# Patient Record
Sex: Male | Born: 1935 | Race: White | Hispanic: No | Marital: Single | Smoking: Former smoker
Health system: Southern US, Community
[De-identification: ages and names within clinical notes are randomized; demographics above are authoritative.]

## PROBLEM LIST (undated history)

## (undated) DIAGNOSIS — I480 Paroxysmal atrial fibrillation: Secondary | ICD-10-CM

## (undated) DIAGNOSIS — I428 Other cardiomyopathies: Secondary | ICD-10-CM

## (undated) DIAGNOSIS — N183 Chronic kidney disease, stage 3 unspecified: Secondary | ICD-10-CM

## (undated) DIAGNOSIS — R001 Bradycardia, unspecified: Secondary | ICD-10-CM

## (undated) DIAGNOSIS — H9190 Unspecified hearing loss, unspecified ear: Secondary | ICD-10-CM

## (undated) DIAGNOSIS — I340 Nonrheumatic mitral (valve) insufficiency: Secondary | ICD-10-CM

## (undated) DIAGNOSIS — B029 Zoster without complications: Secondary | ICD-10-CM

## (undated) DIAGNOSIS — I2510 Atherosclerotic heart disease of native coronary artery without angina pectoris: Secondary | ICD-10-CM

## (undated) DIAGNOSIS — I4891 Unspecified atrial fibrillation: Secondary | ICD-10-CM

## (undated) DIAGNOSIS — I519 Heart disease, unspecified: Secondary | ICD-10-CM

## (undated) DIAGNOSIS — R600 Localized edema: Secondary | ICD-10-CM

## (undated) DIAGNOSIS — I10 Essential (primary) hypertension: Secondary | ICD-10-CM

## (undated) DIAGNOSIS — I071 Rheumatic tricuspid insufficiency: Secondary | ICD-10-CM

## (undated) DIAGNOSIS — M1990 Unspecified osteoarthritis, unspecified site: Secondary | ICD-10-CM

## (undated) DIAGNOSIS — J449 Chronic obstructive pulmonary disease, unspecified: Secondary | ICD-10-CM

## (undated) DIAGNOSIS — I272 Pulmonary hypertension, unspecified: Secondary | ICD-10-CM

## (undated) DIAGNOSIS — K219 Gastro-esophageal reflux disease without esophagitis: Secondary | ICD-10-CM

## (undated) HISTORY — PX: TOTAL HIP ARTHROPLASTY: SHX124

## (undated) HISTORY — DX: Heart disease, unspecified: I51.9

## (undated) HISTORY — DX: Unspecified osteoarthritis, unspecified site: M19.90

## (undated) HISTORY — DX: Bradycardia, unspecified: R00.1

## (undated) HISTORY — DX: Chronic obstructive pulmonary disease, unspecified: J44.9

## (undated) HISTORY — DX: Paroxysmal atrial fibrillation: I48.0

## (undated) HISTORY — DX: Nonrheumatic mitral (valve) insufficiency: I34.0

## (undated) HISTORY — DX: Essential (primary) hypertension: I10

## (undated) HISTORY — PX: CATARACT EXTRACTION W/ INTRAOCULAR LENS IMPLANT: SHX1309

## (undated) HISTORY — DX: Gastro-esophageal reflux disease without esophagitis: K21.9

## (undated) HISTORY — DX: Chronic kidney disease, stage 3 unspecified: N18.30

## (undated) HISTORY — PX: DIALYSIS FISTULA CREATION: SHX611

## (undated) HISTORY — DX: Localized edema: R60.0

## (undated) HISTORY — DX: Atherosclerotic heart disease of native coronary artery without angina pectoris: I25.10

## (undated) HISTORY — DX: Pulmonary hypertension, unspecified: I27.20

## (undated) HISTORY — DX: Other cardiomyopathies: I42.8

## (undated) HISTORY — DX: Rheumatic tricuspid insufficiency: I07.1

## (undated) HISTORY — DX: Chronic kidney disease, stage 3 (moderate): N18.3

---

## 2007-03-17 ENCOUNTER — Emergency Department: Payer: Self-pay | Admitting: Emergency Medicine

## 2007-03-17 ENCOUNTER — Ambulatory Visit: Payer: Self-pay | Admitting: Internal Medicine

## 2007-03-17 ENCOUNTER — Inpatient Hospital Stay (HOSPITAL_COMMUNITY): Admission: AD | Admit: 2007-03-17 | Discharge: 2007-04-02 | Payer: Self-pay | Admitting: Internal Medicine

## 2007-03-17 ENCOUNTER — Ambulatory Visit: Payer: Self-pay | Admitting: Cardiovascular Disease

## 2007-03-24 ENCOUNTER — Ambulatory Visit: Payer: Self-pay | Admitting: Vascular Surgery

## 2007-03-28 ENCOUNTER — Encounter (INDEPENDENT_AMBULATORY_CARE_PROVIDER_SITE_OTHER): Payer: Self-pay | Admitting: Gastroenterology

## 2007-04-17 ENCOUNTER — Ambulatory Visit: Payer: Self-pay | Admitting: *Deleted

## 2007-05-06 ENCOUNTER — Ambulatory Visit: Payer: Self-pay | Admitting: *Deleted

## 2007-06-30 ENCOUNTER — Ambulatory Visit (HOSPITAL_COMMUNITY): Admission: RE | Admit: 2007-06-30 | Discharge: 2007-06-30 | Payer: Self-pay | Admitting: Nephrology

## 2007-10-01 ENCOUNTER — Emergency Department (HOSPITAL_COMMUNITY): Admission: EM | Admit: 2007-10-01 | Discharge: 2007-10-01 | Payer: Self-pay | Admitting: Emergency Medicine

## 2007-10-02 ENCOUNTER — Encounter (INDEPENDENT_AMBULATORY_CARE_PROVIDER_SITE_OTHER): Payer: Self-pay | Admitting: Internal Medicine

## 2008-01-11 ENCOUNTER — Inpatient Hospital Stay (HOSPITAL_COMMUNITY): Admission: EM | Admit: 2008-01-11 | Discharge: 2008-01-21 | Payer: Self-pay | Admitting: Emergency Medicine

## 2008-01-11 ENCOUNTER — Ambulatory Visit: Payer: Self-pay | Admitting: Cardiology

## 2008-01-12 ENCOUNTER — Encounter (INDEPENDENT_AMBULATORY_CARE_PROVIDER_SITE_OTHER): Payer: Self-pay | Admitting: Internal Medicine

## 2008-01-13 ENCOUNTER — Ambulatory Visit: Payer: Self-pay | Admitting: Cardiology

## 2008-01-14 ENCOUNTER — Encounter: Payer: Self-pay | Admitting: Cardiology

## 2008-02-02 ENCOUNTER — Ambulatory Visit: Payer: Self-pay | Admitting: Physician Assistant

## 2008-02-20 ENCOUNTER — Ambulatory Visit: Payer: Self-pay | Admitting: Cardiology

## 2008-03-15 ENCOUNTER — Ambulatory Visit (HOSPITAL_COMMUNITY): Admission: RE | Admit: 2008-03-15 | Discharge: 2008-03-15 | Payer: Self-pay | Admitting: Internal Medicine

## 2008-05-26 ENCOUNTER — Ambulatory Visit: Payer: Self-pay | Admitting: Cardiology

## 2008-06-09 ENCOUNTER — Ambulatory Visit: Payer: Self-pay | Admitting: Cardiology

## 2008-07-16 ENCOUNTER — Ambulatory Visit: Payer: Self-pay | Admitting: Cardiology

## 2008-07-30 ENCOUNTER — Ambulatory Visit: Payer: Self-pay | Admitting: Cardiology

## 2008-08-20 ENCOUNTER — Encounter: Payer: Self-pay | Admitting: Cardiology

## 2008-08-20 ENCOUNTER — Ambulatory Visit: Payer: Self-pay | Admitting: Cardiology

## 2008-08-20 DIAGNOSIS — I429 Cardiomyopathy, unspecified: Secondary | ICD-10-CM

## 2008-08-20 DIAGNOSIS — E785 Hyperlipidemia, unspecified: Secondary | ICD-10-CM

## 2008-08-20 DIAGNOSIS — I10 Essential (primary) hypertension: Secondary | ICD-10-CM | POA: Insufficient documentation

## 2008-08-20 DIAGNOSIS — I2510 Atherosclerotic heart disease of native coronary artery without angina pectoris: Secondary | ICD-10-CM | POA: Insufficient documentation

## 2008-08-24 ENCOUNTER — Encounter (INDEPENDENT_AMBULATORY_CARE_PROVIDER_SITE_OTHER): Payer: Self-pay | Admitting: *Deleted

## 2008-08-24 LAB — CONVERTED CEMR LAB
ALT: 9 units/L
AST: 15 units/L
Alkaline Phosphatase: 78 units/L
BUN: 29 mg/dL
Bilirubin, Direct: 0.3 mg/dL
CO2: 24 meq/L
Chloride: 108 meq/L
Glucose, Bld: 97 mg/dL
HDL: 44 mg/dL
LDL Cholesterol: 44 mg/dL
Potassium: 4.3 meq/L
Sodium: 143 meq/L
Total Protein: 7.3 g/dL

## 2008-08-26 ENCOUNTER — Encounter: Payer: Self-pay | Admitting: Cardiology

## 2008-09-02 ENCOUNTER — Encounter (INDEPENDENT_AMBULATORY_CARE_PROVIDER_SITE_OTHER): Payer: Self-pay | Admitting: *Deleted

## 2008-09-16 ENCOUNTER — Telehealth: Payer: Self-pay | Admitting: Cardiology

## 2008-09-22 ENCOUNTER — Encounter: Payer: Self-pay | Admitting: Cardiology

## 2008-09-22 ENCOUNTER — Ambulatory Visit: Payer: Self-pay | Admitting: Cardiology

## 2008-10-04 ENCOUNTER — Encounter: Payer: Self-pay | Admitting: Cardiology

## 2008-10-05 LAB — CONVERTED CEMR LAB: Pro B Natriuretic peptide (BNP): 89.3 pg/mL (ref 0.0–100.0)

## 2008-11-16 ENCOUNTER — Ambulatory Visit: Payer: Self-pay | Admitting: Cardiology

## 2008-11-16 DIAGNOSIS — I5022 Chronic systolic (congestive) heart failure: Secondary | ICD-10-CM

## 2009-02-08 ENCOUNTER — Encounter: Payer: Self-pay | Admitting: Cardiology

## 2009-02-08 ENCOUNTER — Encounter (INDEPENDENT_AMBULATORY_CARE_PROVIDER_SITE_OTHER): Payer: Self-pay | Admitting: *Deleted

## 2009-02-08 LAB — CONVERTED CEMR LAB
Chloride: 106 meq/L
Glucose, Bld: 95 mg/dL
Potassium: 4.8 meq/L
Sodium: 141 meq/L

## 2009-02-09 ENCOUNTER — Encounter: Payer: Self-pay | Admitting: Cardiology

## 2009-02-09 LAB — CONVERTED CEMR LAB
CO2: 20 meq/L (ref 19–32)
Creatinine, Ser: 1.87 mg/dL — ABNORMAL HIGH (ref 0.40–1.50)
Potassium: 4.8 meq/L (ref 3.5–5.3)
Sodium: 141 meq/L (ref 135–145)

## 2009-02-17 ENCOUNTER — Encounter (INDEPENDENT_AMBULATORY_CARE_PROVIDER_SITE_OTHER): Payer: Self-pay | Admitting: *Deleted

## 2009-02-17 ENCOUNTER — Ambulatory Visit: Payer: Self-pay | Admitting: Cardiology

## 2009-02-17 DIAGNOSIS — N183 Chronic kidney disease, stage 3 (moderate): Secondary | ICD-10-CM

## 2009-05-09 ENCOUNTER — Encounter (INDEPENDENT_AMBULATORY_CARE_PROVIDER_SITE_OTHER): Payer: Self-pay | Admitting: *Deleted

## 2009-05-09 LAB — CONVERTED CEMR LAB
AST: 12 units/L
BUN: 35 mg/dL
Bilirubin, Direct: 0.1 mg/dL
Chloride: 108 meq/L
Creatinine, Ser: 1.94 mg/dL
HDL: 49 mg/dL
Sodium: 143 meq/L
Total Protein: 7.5 g/dL
Triglycerides: 70 mg/dL

## 2009-05-10 ENCOUNTER — Encounter: Payer: Self-pay | Admitting: Cardiology

## 2009-05-10 LAB — CONVERTED CEMR LAB
AST: 12 units/L (ref 0–37)
Bilirubin, Direct: 0.1 mg/dL (ref 0.0–0.3)
Sodium: 143 meq/L (ref 135–145)

## 2009-05-18 ENCOUNTER — Encounter (INDEPENDENT_AMBULATORY_CARE_PROVIDER_SITE_OTHER): Payer: Self-pay | Admitting: *Deleted

## 2009-05-19 ENCOUNTER — Ambulatory Visit: Payer: Self-pay | Admitting: Cardiology

## 2009-05-24 ENCOUNTER — Encounter: Payer: Self-pay | Admitting: Cardiology

## 2009-05-24 ENCOUNTER — Ambulatory Visit: Payer: Self-pay | Admitting: Cardiovascular Disease

## 2009-05-24 ENCOUNTER — Ambulatory Visit (HOSPITAL_COMMUNITY): Admission: RE | Admit: 2009-05-24 | Discharge: 2009-05-24 | Payer: Self-pay | Admitting: Cardiology

## 2009-10-20 ENCOUNTER — Ambulatory Visit: Payer: Self-pay | Admitting: Cardiology

## 2009-10-26 ENCOUNTER — Encounter (INDEPENDENT_AMBULATORY_CARE_PROVIDER_SITE_OTHER): Payer: Self-pay | Admitting: *Deleted

## 2009-10-26 ENCOUNTER — Encounter: Payer: Self-pay | Admitting: Cardiology

## 2009-10-26 LAB — CONVERTED CEMR LAB
AST: 13 units/L
Albumin: 4.3 g/dL
Alkaline Phosphatase: 74 units/L
Bilirubin, Direct: 0.1 mg/dL
Sodium: 140 meq/L
Total Protein: 6.8 g/dL
Triglycerides: 62 mg/dL

## 2009-10-27 ENCOUNTER — Encounter (INDEPENDENT_AMBULATORY_CARE_PROVIDER_SITE_OTHER): Payer: Self-pay | Admitting: *Deleted

## 2009-10-27 LAB — CONVERTED CEMR LAB
Albumin: 4.3 g/dL (ref 3.5–5.2)
BUN: 41 mg/dL — ABNORMAL HIGH (ref 6–23)
Chloride: 107 meq/L (ref 96–112)
Cholesterol: 125 mg/dL (ref 0–200)
Creatinine, Ser: 2.02 mg/dL — ABNORMAL HIGH (ref 0.40–1.50)
Glucose, Bld: 96 mg/dL (ref 70–99)
LDL Cholesterol: 66 mg/dL (ref 0–99)
Potassium: 4.6 meq/L (ref 3.5–5.3)
Sodium: 140 meq/L (ref 135–145)
Total CHOL/HDL Ratio: 2.7

## 2010-02-08 ENCOUNTER — Telehealth (INDEPENDENT_AMBULATORY_CARE_PROVIDER_SITE_OTHER): Payer: Self-pay | Admitting: *Deleted

## 2010-04-18 NOTE — Miscellaneous (Signed)
Summary: labs bmp,lipid,liver,10/26/2009  Clinical Lists Changes  Observations: Added new observation of ALBUMIN: 4.3 g/dL (16/12/9602 5:40) Added new observation of PROTEIN, TOT: 6.8 g/dL (98/01/9146 8:29) Added new observation of SGPT (ALT): 8 units/L (10/26/2009 9:31) Added new observation of SGOT (AST): 13 units/L (10/26/2009 9:31) Added new observation of ALK PHOS: 74 units/L (10/26/2009 9:31) Added new observation of BILI DIRECT: 0.1 mg/dL (56/21/3086 5:78) Added new observation of CREATININE: 2.02 mg/dL (46/96/2952 8:41) Added new observation of BUN: 41 mg/dL (32/44/0102 7:25) Added new observation of BG RANDOM: 96 mg/dL (36/64/4034 7:42) Added new observation of CO2 PLSM/SER: 19 meq/L (10/26/2009 9:31) Added new observation of CL SERUM: 107 meq/L (10/26/2009 9:31) Added new observation of K SERUM: 4.6 meq/L (10/26/2009 9:31) Added new observation of NA: 140 meq/L (10/26/2009 9:31) Added new observation of LDL: 66 mg/dL (59/56/3875 6:43) Added new observation of HDL: 47 mg/dL (32/95/1884 1:66) Added new observation of TRIGLYC TOT: 62 mg/dL (09/16/1599 0:93) Added new observation of CHOLESTEROL: 125 mg/dL (23/55/7322 0:25)

## 2010-04-18 NOTE — Miscellaneous (Signed)
Summary: labs bmp,lipids,liver 05/09/2009  Clinical Lists Changes  Observations: Added new observation of CALCIUM: 9.5 mg/dL (04/54/0981 1:91) Added new observation of ALBUMIN: 4.5 g/dL (47/82/9562 1:30) Added new observation of PROTEIN, TOT: 7.5 g/dL (86/57/8469 6:29) Added new observation of SGPT (ALT): <8 units/L (05/09/2009 8:22) Added new observation of SGOT (AST): 12 units/L (05/09/2009 8:22) Added new observation of ALK PHOS: 81 units/L (05/09/2009 8:22) Added new observation of BILI DIRECT: 0.1 mg/dL (52/84/1324 4:01) Added new observation of CREATININE: 1.94 mg/dL (02/72/5366 4:40) Added new observation of BUN: 35 mg/dL (34/74/2595 6:38) Added new observation of BG RANDOM: 96 mg/dL (75/64/3329 5:18) Added new observation of CO2 PLSM/SER: 23 meq/L (05/09/2009 8:22) Added new observation of CL SERUM: 108 meq/L (05/09/2009 8:22) Added new observation of K SERUM: 5.3 meq/L (05/09/2009 8:22) Added new observation of NA: 143 meq/L (05/09/2009 8:22) Added new observation of LDL: 74 mg/dL (84/16/6063 0:16) Added new observation of HDL: 49 mg/dL (03/27/3233 5:73) Added new observation of TRIGLYC TOT: 70 mg/dL (22/04/5425 0:62) Added new observation of CHOLESTEROL: 137 mg/dL (37/62/8315 1:76)

## 2010-04-18 NOTE — Assessment & Plan Note (Signed)
Summary: 3 mth f/u per checkout on 02/17/09/tg   Visit Type:  Follow-up Primary Provider:  Dr. Catalina Pizza   History of Present Illness: 75 year old male presents for a followup visit. He reports stable NYHA class 2-3 dyspnea on exertion, ambulating with a walker. He has had no chest pain or palpitations. He denies any falls or syncope. Chronic lower extremity edema has been stable.  Recent labs from February 21 revealed potassium 5.3, BUN 35, creatinine 1.9, sodium 143, total cholesterol 137, triglycerides 70, HDL 49, LDL 74, AST 12, ALT less than 8.  Last assessment of cardiac structure and function was by echocardiography in October 2009, detailed below.    Current Medications (verified): 1)  Simvastatin 5 Mg Tabs (Simvastatin) .... Take 1 Tablet By Mouth Once A Day 2)  Aspirin 81 Mg Tbec (Aspirin) .... Take One Tablet By Mouth Daily 3)  Furosemide 40 Mg Tabs (Furosemide) .... Take1/2 Tab Daily 4)  Nitroglycerin 0.4 Mg Subl (Nitroglycerin) .... One Tablet Under Tongue Every 5 Minutes As Needed For Chest Pain---May Repeat Times Three 5)  Carvedilol 6.25 Mg Tabs (Carvedilol) .... Take 1 Tablet Two Times A Day 6)  Lisinopril 10 Mg Tabs (Lisinopril) .... Take One Half  Tablet By Mouth Twice A Day 7)  Spiriva Handihaler 18 Mcg Caps (Tiotropium Bromide Monohydrate) .... Inhale One Capsule Daily 8)  Amlodipine Besylate 5 Mg Tabs (Amlodipine Besylate) .... Take 1 Tablet By Mouth Once Daily  Allergies (verified): No Known Drug Allergies  Past History:  Social History: Last updated: 02/02/2008 Single  Tobacco Use - Former.  Alcohol Use - no  Past Medical History: NICM, LVEF 15-20% Chronic Systolic CHF CAD - nonobstructive Hypertension Chronic kidney disease, stage 3      A.  status post arteriovenous fistula  placement, January 2009 in setting of ARF       B.   Secondary hyperparathyroidism Chronic obstructive pulmonary disease  Pulmonary Hypertension Moderate Mitral  Regurgitation Right Ventricular Dysfunction Severe tricuspid regurgitation  Normocytic anemia      A.   Reported history of heme-positive stools Chronic Lower Extremity Edema        A.  h/o cellulitis  Reported history of paroxysmal atrial fibrillation Gastroesophageal reflux disease  Asymptomatic bradycardia  Degenerative joint disease   Clinical Review Panels:  Echocardiogram Echocardiogram  SUMMARY   -  Overall left ventricular systolic function was severely reduced.         Left ventricular ejection fraction was estimated , range         being 15 % to 20 %. There was severe diffuse left ventricular         hypokinesis. Left ventricular wall thickness was moderately         increased.   -  There was mild aortic valvular regurgitation.   -  There was mild mitral annular calcification. There was moderate         mitral valvular regurgitation. The effective orifice of         mitral regurgitation by proximal isovelocity surface area was         0.16 cm^2. The volume of mitral regurgitation by proximal         isovelocity surface area was 17 cc.   -  The left atrium was mild to moderately dilated.   -  The right ventricle was mildly dilated. Right ventricular         systolic function was mildly reduced.   -  The estimated peak  pulmonary artery systolic pressure was mildly         increased.   -  There was severe tricuspid valvular regurgitation.   -  The right atrium was moderately dilated.     ---------------------------------------------------------------    Prepared and Electronically Authenticated by    Olga Millers M.D.   Confirmed 12-Jan-2008 18:33:07  Additional Information  HL7 RESULT STATUS : F  External IF Update Timestamp : 2008-01-12:18:33:00.000000 (01/12/2008)    Review of Systems  The patient denies anorexia, fever, weight loss, chest pain, syncope, prolonged cough, hemoptysis, abdominal pain, melena, and hematochezia.         Patient reports  fluctuating weight by a few pounds, but no clear trend. He is up 5 pounds from his last visit. Otherwise reviewed and negative except as outlined above.  Vital Signs:  Patient profile:   75 year old male Weight:      168 pounds Pulse rate:   60 / minute BP sitting:   158 / 78  (right arm)  Vitals Entered By: Dreama Saa, CNA (May 19, 2009 10:51 AM)  Physical Exam  Additional Exam:  Chronically ill-appearing male in no acute distress, ambulates with walker. HEENT: Conjunctiva and lids are normal, oropharynx clear with poor dentition.  Neck: Supple, no loud carotid bruits. Jugular venous pressure approximately 10 cm of water. Lungs: Diminished breath sounds, otherwise clear without wheezing. Nonlabored breathing at rest. Cardiac: Regular rate and rhythm, indistinct PMI, no S3 gallop. Abdomen: Protuberant, bowel sounds present, no tenderness. Extremities: Chronic appearing trace edema and venous stasis with diminished distal pulses. Skin: Warm and dry. Musculoskeletal: Mild kyphosis. Neuropsychiatric: Alert and oriented x3, affect appropriate.   Impression & Recommendations:  Problem # 1:  CHRONIC SYSTOLIC HEART FAILURE (ICD-428.22)  Symptomatically stable on present medical regimen. Weight tends to fluctuate by a few pounds at most. We discussed sodium restriction again today. We have not had to significantly modify his medications over the last few months. Next visit will be scheduled for 4 months, preceded by a BMET.  His updated medication list for this problem includes:    Aspirin 81 Mg Tbec (Aspirin) .Marland Kitchen... Take one tablet by mouth daily    Furosemide 40 Mg Tabs (Furosemide) .Marland Kitchen... Take1/2 tab daily    Nitroglycerin 0.4 Mg Subl (Nitroglycerin) ..... One tablet under tongue every 5 minutes as needed for chest pain---may repeat times three    Carvedilol 6.25 Mg Tabs (Carvedilol) .Marland Kitchen... Take 1 tablet two times a day    Lisinopril 10 Mg Tabs (Lisinopril) .Marland Kitchen... Take one half   tablet by mouth twice a day    Amlodipine Besylate 5 Mg Tabs (Amlodipine besylate) .Marland Kitchen... Take 1 tablet by mouth once daily  Future Orders: T-Basic Metabolic Panel [id number]) ... 09/12/2009  Problem # 2:  RENAL INSUFFICIENCY (ICD-588.9)  Overall stable, most recent creatinine 1.9.  Problem # 3:  OTHER PRIMARY CARDIOMYOPATHIES (ICD-425.4)  Nonischemic cardiomyopathy. Followup 2-D echocardiogram is planned to reassess left ventricular systolic function as well as previously documented valvular abnormalities, on regular medical therapy.  His updated medication list for this problem includes:    Aspirin 81 Mg Tbec (Aspirin) .Marland Kitchen... Take one tablet by mouth daily    Furosemide 40 Mg Tabs (Furosemide) .Marland Kitchen... Take1/2 tab daily    Nitroglycerin 0.4 Mg Subl (Nitroglycerin) ..... One tablet under tongue every 5 minutes as needed for chest pain---may repeat times three    Carvedilol 6.25 Mg Tabs (Carvedilol) .Marland Kitchen... Take 1 tablet two times a day  Lisinopril 10 Mg Tabs (Lisinopril) .Marland Kitchen... Take one half  tablet by mouth twice a day    Amlodipine Besylate 5 Mg Tabs (Amlodipine besylate) .Marland Kitchen... Take 1 tablet by mouth once daily  Orders: 2-D Echocardiogram (2D Echo)  Problem # 4:  ESSENTIAL HYPERTENSION, BENIGN (ICD-401.1)  Mr. Marrazzo continues to follow with Dr. Margo Aye. Recommend further up titration of medical therapy, possibly amlodipine, if his blood pressure trend continues.  His updated medication list for this problem includes:blood pressure modestly elevated today.    Aspirin 81 Mg Tbec (Aspirin) .Marland Kitchen... Take one tablet by mouth daily    Furosemide 40 Mg Tabs (Furosemide) .Marland Kitchen... Take1/2 tab daily    Carvedilol 6.25 Mg Tabs (Carvedilol) .Marland Kitchen... Take 1 tablet two times a day    Lisinopril 10 Mg Tabs (Lisinopril) .Marland Kitchen... Take one half  tablet by mouth twice a day    Amlodipine Besylate 5 Mg Tabs (Amlodipine besylate) .Marland Kitchen... Take 1 tablet by mouth once daily  Problem # 5:  HYPERLIPIDEMIA  (ICD-272.4)  Lipids are well controlled.  His updated medication list for this problem includes:    Simvastatin 5 Mg Tabs (Simvastatin) .Marland Kitchen... Take 1 tablet by mouth once a day  Patient Instructions: 1)  Your physician recommends that you schedule a follow-up appointment in: 4 months 2)  Your physician recommends that you return for lab work in: 4 months, before next visit. 3)  Your physician has requested that you have an echocardiogram.  Echocardiography is a painless test that uses sound waves to create images of your heart. It provides your doctor with information about the size and shape of your heart and how well your heart's chambers and valves are working.  This procedure takes approximately one hour. There are no restrictions for this procedure.

## 2010-04-18 NOTE — Progress Notes (Signed)
Summary: pt needs rx called in asap  Phone Note Call from Patient Call back at Home Phone [phone]   Caller: pt Reason for Call: Refill Medication Summary of Call: pt needs speriva refilled pt is in [place] and needs it sent to [facility] [phone]. pt states that htey are not sure if we wrote rx or if dr hall did she will check with dr hall also,. Initial call taken by: Faythe Ghee,  February 08, 2010 10:41 AM  Follow-up for Phone Call        Dr. Margo Aye is pt PCP and should fill his spiriva  Follow-up by: Teressa Lower RN,  February 08, 2010 12:42 PM

## 2010-04-18 NOTE — Assessment & Plan Note (Signed)
Summary: 4 mth f/u per checkout on 05/19/09/tg   Visit Type:  Follow-up Primary [name] Rice:  Dr. Catalina Pizza   History of Present Illness: 75 year old male presents for followup. [name] Rice was last seen back in March. We arranged a followup echocardiogram which is reviewed below, showing normalization of LVEF on medical therapy. I reviewed this with him today.  [name] Rice denies any progressive shortness of breath or chest pain. Chronic lower extremity edema persists, although much better than noted in the past. Reviewed his medications.  Blood pressure is still not well controlled. We discussed this again today. [name] Rice has not had recent followup lab work.  Current Medications (verified): 1)  Simvastatin 5 Mg Tabs (Simvastatin) .... Take 1 Tablet By Mouth Once A Day 2)  Aspirin 81 Mg Tbec (Aspirin) .... Take One Tablet By Mouth Daily 3)  Furosemide 40 Mg Tabs (Furosemide) .... Take1/2 Tab Daily 4)  Nitroglycerin 0.4 Mg Subl (Nitroglycerin) .... One Tablet Under Tongue Every 5 Minutes As Needed For Chest Pain---May Repeat Times Three 5)  Carvedilol 6.25 Mg Tabs (Carvedilol) .... Take 1 Tablet Two Times A Day 6)  Lisinopril 5 Mg Tabs (Lisinopril) .... Take 1 Tablet By Mouth Once Daily 7)  Spiriva Handihaler 18 Mcg Caps (Tiotropium Bromide Monohydrate) .... Inhale One Capsule Daily 8)  Amlodipine Besylate 5 Mg Tabs (Amlodipine Besylate) .... Take 1 Tablet By Mouth Once Daily  Allergies (verified): No Known Drug Allergies  Past History:  Social History: Last updated: 02/02/2008 Single  Tobacco Use - Former.  Alcohol Use - no  Past Medical History: NICM, LVEF 15-20% improved to 55% on medical therapy CAD - nonobstructive Hypertension Chronic kidney disease, stage 3      A.  status post arteriovenous fistula  placement, January 2009 in setting of ARF       B.   Secondary hyperparathyroidism Chronic obstructive pulmonary disease  Pulmonary Hypertension Moderate Mitral Regurgitation Right Ventricular  Dysfunction Severe tricuspid regurgitation  Normocytic anemia      A.   Reported history of heme-positive stools Chronic Lower Extremity Edema        A.  h/o cellulitis  Reported history of paroxysmal atrial fibrillation Gastroesophageal reflux disease  Asymptomatic bradycardia  Degenerative joint disease   Review of Systems       The patient complains of peripheral edema.  The patient denies anorexia, fever, weight gain, chest pain, syncope, dyspnea on exertion, melena, and hematochezia.         Otherwise reviewed and negative.  Vital Signs:  Patient profile:   75 year old male Height:      67 inches Weight:      167 pounds BMI:     26.25 O2 Sat:      96 % on Room air Temp:     96.8 degrees F oral Pulse rate:   67 / minute BP sitting:   156 / 67  (right arm)  Vitals Entered By: Teressa Lower RN (October 20, 2009 2:47 PM)  Nutrition Counseling: Patient's BMI is greater than 25 and therefore counseled on weight management options.  O2 Flow:  Room air  Physical Exam  Additional Exam:  Chronically ill-appearing male in no acute distress, ambulates with walker. HEENT: Conjunctiva and lids are normal, oropharynx clear with poor dentition.  Neck: Supple, no loud carotid bruits. Jugular venous pressure approximately 10 cm of water. Lungs: Diminished breath sounds, otherwise clear without wheezing. Nonlabored breathing at rest. Cardiac: Regular rate and rhythm, indistinct PMI, no S3 gallop.  Abdomen: Protuberant, bowel sounds present, no tenderness. Extremities: Chronic appearing 1+ edema and venous stasis with diminished distal pulses. Skin: Warm and dry. Musculoskeletal: Mild kyphosis. Neuropsychiatric: Alert and oriented x3, affect appropriate.   Echocardiogram  Procedure date:  05/24/2009  Findings:      Study Conclusions            - Left ventricle: The cavity size was mildly dilated. Wall thickness       was increased in a pattern of moderate LVH. The estimated  ejection       fraction was 55%.     - Aortic valve: Mild regurgitation.     - Mitral valve: Mild regurgitation.     - Left atrium: The atrium was mildly dilated.     - Atrial septum: No defect or patent foramen ovale was identified.     - Pulmonary arteries: PA peak pressure: 49mm Hg (S).  Impression & Recommendations:  Problem # 1:  OTHER PRIMARY CARDIOMYOPATHIES (ICD-425.4)  Nonischemic cardiomyopathy with subsequent normalization of LVEF on medical therapy. Symptomatically stable.  His updated medication list for this problem includes:    Aspirin 81 Mg Tbec (Aspirin) .Marland Kitchen... Take one tablet by mouth daily    Furosemide 40 Mg Tabs (Furosemide) .Marland Kitchen... Take1/2 tab daily    Nitroglycerin 0.4 Mg Subl (Nitroglycerin) ..... One tablet under tongue every 5 minutes as needed for chest pain---may repeat times three    Carvedilol 6.25 Mg Tabs (Carvedilol) .Marland Kitchen... Take 1 tablet two times a day    Lisinopril 5 Mg Tabs (Lisinopril) .Marland Kitchen... Take 1 tablet by mouth once daily    Amlodipine Besylate 5 Mg Tabs (Amlodipine besylate) .Marland Kitchen... Take 1 tablet by mouth once daily  Problem # 2:  ESSENTIAL HYPERTENSION, BENIGN (ICD-401.1)  Blood pressure not well controlled. Would like to obtain followup BMET prior to adjusting medications further. [name] Rice has known renal insufficiency.  His updated medication list for this problem includes:    Aspirin 81 Mg Tbec (Aspirin) .Marland Kitchen... Take one tablet by mouth daily    Furosemide 40 Mg Tabs (Furosemide) .Marland Kitchen... Take1/2 tab daily    Carvedilol 6.25 Mg Tabs (Carvedilol) .Marland Kitchen... Take 1 tablet two times a day    Lisinopril 5 Mg Tabs (Lisinopril) .Marland Kitchen... Take 1 tablet by mouth once daily    Amlodipine Besylate 5 Mg Tabs (Amlodipine besylate) .Marland Kitchen... Take 1 tablet by mouth once daily  Future Orders: T-Basic Metabolic Panel [id number]) ... 10/24/2009  Problem # 3:  HYPERLIPIDEMIA (ICD-272.4)  Followup lipid profile and liver function tests.  His updated medication list for this  problem includes:    Simvastatin 5 Mg Tabs (Simvastatin) .Marland Kitchen... Take 1 tablet by mouth once a day  Other Orders: Future Orders: T-Lipid Profile ([id number]) ... 10/24/2009 T-Hepatic Function [id number]) ... 10/24/2009  Patient Instructions: 1)  Your physician recommends that you schedule a follow-up appointment in: 6 months 2)  Your physician recommends that you return for lab work in: Next week 3)  Your physician recommends that you continue on your current medications as directed. Please refer to the Current Medication list given to you today.

## 2010-04-18 NOTE — Letter (Signed)
Summary: [place] [facility] Work [facility], agricultural at [employer]  [address] [place] [address]   Phone: [phone]  Fax: [phone]     May 19, 2009 MRN: [id number]   [facility] [address] [place], [place]  [address]      YOUR LAB WORK IS DUE  September 12, 2009 _________________________________________  Please go to [facility], located across the street from [facility] on the second floor.  Hours are Monday - Friday 7am until 7:30pm         Saturday 8am until 12noon    __  DO NOT EAT OR DRINK AFTER MIDNIGHT EVENING PRIOR TO LABWORK  _X_ YOUR LABWORK IS NOT FASTING --YOU MAY EAT PRIOR TO LABWORK

## 2010-04-18 NOTE — Letter (Signed)
Summary: [place] Results Engineer, agricultural at [facility]  [address], [place] [address]   Phone: [phone]  Fax: [phone]      May 24, 2009 MRN: [id number]   [facility] [address] [place], [place]  [address]   Dear [name] Rice,  Your test ordered by Selena Batten has been reviewed by your physician (or physician assistant) and was found to be normal or stable. Your physician (or physician assistant) felt no changes were needed at this time.  __X__ Echocardiogram  ____ Cardiac Stress Test  ____ Lab Work  ____ Peripheral vascular study of arms, legs or neck  ____ CT scan or X-ray  ____ Lung or Breathing test  ____ Other: Please continue on current medical treatment.   Thank you.    Nona Dell, MD, F.A.C.C

## 2010-04-18 NOTE — Letter (Signed)
Summary: [place] Results Engineer, agricultural at [facility]  [address], [place] [address]   Phone: [phone]  Fax: [phone]      May 10, 2009 MRN: [id number]   [facility] [address] [place], [place]  [address]   Dear Mr. Iannuzzi,  Your test ordered by Selena Batten has been reviewed by your physician (or physician assistant) and was found to be normal or stable. Your physician (or physician assistant) felt no changes were needed at this time.  ____ Echocardiogram  ____ Cardiac Stress Test  __X__ Lab Work  ____ Peripheral vascular study of arms, legs or neck  ____ CT scan or X-ray  ____ Lung or Breathing test  ____ Other: Please continue on current medical treatment.   Thank you.   Nona Dell, MD, F.A.C.C

## 2010-05-04 ENCOUNTER — Ambulatory Visit (INDEPENDENT_AMBULATORY_CARE_PROVIDER_SITE_OTHER): Payer: MEDICARE | Admitting: Cardiology

## 2010-05-04 ENCOUNTER — Encounter: Payer: Self-pay | Admitting: Cardiology

## 2010-05-04 DIAGNOSIS — N289 Disorder of kidney and ureter, unspecified: Secondary | ICD-10-CM

## 2010-05-04 DIAGNOSIS — I429 Cardiomyopathy, unspecified: Secondary | ICD-10-CM

## 2010-05-04 DIAGNOSIS — I10 Essential (primary) hypertension: Secondary | ICD-10-CM

## 2010-05-04 DIAGNOSIS — E782 Mixed hyperlipidemia: Secondary | ICD-10-CM

## 2010-05-05 ENCOUNTER — Encounter: Payer: Self-pay | Admitting: Cardiology

## 2010-05-08 ENCOUNTER — Encounter (INDEPENDENT_AMBULATORY_CARE_PROVIDER_SITE_OTHER): Payer: Self-pay | Admitting: *Deleted

## 2010-05-08 ENCOUNTER — Other Ambulatory Visit (HOSPITAL_COMMUNITY): Payer: MEDICARE

## 2010-05-08 LAB — CONVERTED CEMR LAB
ALT: <8 units/L (ref 0–53)
AST: 13 units/L (ref 0–37)
Albumin: 4.3 g/dL (ref 3.5–5.2)
Alkaline Phosphatase: 69 units/L (ref 39–117)
CO2: 23 meq/L (ref 19–32)
Calcium: 9.2 mg/dL (ref 8.4–10.5)
Cholesterol: 130 mg/dL (ref 0–200)
Glucose, Bld: 97 mg/dL (ref 70–99)
HDL: 38 mg/dL — ABNORMAL LOW (ref >39)
LDL Cholesterol: 77 mg/dL (ref 0–99)
Potassium: 5 meq/L (ref 3.5–5.3)
Sodium: 142 meq/L (ref 135–145)
Total CHOL/HDL Ratio: 3.4
Total Protein: 7.4 g/dL (ref 6.0–8.3)

## 2010-05-09 ENCOUNTER — Ambulatory Visit (HOSPITAL_COMMUNITY)
Admission: RE | Admit: 2010-05-09 | Discharge: 2010-05-09 | Disposition: A | Discharge status: Home / Self Care | Payer: MEDICARE | Source: Ambulatory Visit | Attending: Cardiology | Admitting: Cardiology

## 2010-05-09 ENCOUNTER — Encounter: Payer: Self-pay | Admitting: Cardiology

## 2010-05-09 DIAGNOSIS — R0609 Other forms of dyspnea: Secondary | ICD-10-CM | POA: Insufficient documentation

## 2010-05-09 DIAGNOSIS — I428 Other cardiomyopathies: Secondary | ICD-10-CM | POA: Insufficient documentation

## 2010-05-09 DIAGNOSIS — I059 Rheumatic mitral valve disease, unspecified: Secondary | ICD-10-CM

## 2010-05-09 DIAGNOSIS — R0989 Other specified symptoms and signs involving the circulatory and respiratory systems: Secondary | ICD-10-CM | POA: Insufficient documentation

## 2010-05-10 ENCOUNTER — Encounter: Payer: Self-pay | Admitting: Cardiology

## 2010-05-10 NOTE — Assessment & Plan Note (Signed)
Summary: 6 mth f/u/tg   Visit Type:  Follow-up Primary Provider:  Dr. Dwana Melena   History of Present Illness: 75 year old male presents for followup. He was seen back in August 2011. He reports no change in shortness of breath with activity, occasionally still uses a walker, has had no falls, and denies any chest pain. He states that he lived with his sister up in [place] for a few months since I last saw him.  Labwork from August showed sodium 140, potassium 4.6, BUN 41, creatinine 2.0, cholesterol 125, triglycerides 62, HDL 47, LDL 66, AST 13, ALT 8. No followup labs since that time.  He did not bring his medicines in today. States he does need some refills. Plans to call back with the details.  Current Medications (verified): 1)  Simvastatin 5 Mg Tabs (Simvastatin) .... Take 1 Tablet By Mouth Once A Day 2)  Aspirin 81 Mg Tbec (Aspirin) .... Take One Tablet By Mouth Daily 3)  Furosemide 40 Mg Tabs (Furosemide) .... Take1/2 Tab Daily 4)  Nitroglycerin 0.4 Mg Subl (Nitroglycerin) .... One Tablet Under Tongue Every 5 Minutes As Needed For Chest Pain---May Repeat Times Three 5)  Carvedilol 6.25 Mg Tabs (Carvedilol) .... Take 1 Tablet Two Times A Day 6)  Lisinopril 5 Mg Tabs (Lisinopril) .... Take 1 Tablet By Mouth Once Daily 7)  Spiriva Handihaler 18 Mcg Caps (Tiotropium Bromide Monohydrate) .... Inhale One Capsule Daily 8)  Amlodipine Besylate 10 Mg Tabs (Amlodipine Besylate) .... Take One Tablet By Mouth Daily  Allergies (verified): No Known Drug Allergies  Comments:  Nurse/Medical Assistant: patient didnt bring meds or list patients caregiver states all meds are the same [facility] in [place] is pharmacy   Past History:  Past Medical History: Last updated: 10/20/2009 NICM, LVEF 15-20% improved to 55% on medical therapy CAD - nonobstructive Hypertension Chronic kidney disease, stage 3      A.  status post arteriovenous fistula  placement, January 2009 in setting of  ARF       B.   Secondary hyperparathyroidism Chronic obstructive pulmonary disease  Pulmonary Hypertension Moderate Mitral Regurgitation Right Ventricular Dysfunction Severe tricuspid regurgitation  Normocytic anemia      A.   Reported history of heme-positive stools Chronic Lower Extremity Edema        A.  h/o cellulitis  Reported history of paroxysmal atrial fibrillation Gastroesophageal reflux disease  Asymptomatic bradycardia  Degenerative joint disease   Social History: Last updated: 02/02/2008 Single  Tobacco Use - Former.  Alcohol Use - no  Review of Systems       The patient complains of dyspnea on exertion and peripheral edema.  The patient denies anorexia, fever, weight gain, chest pain, syncope, abdominal pain, melena, and hematochezia.         Chronic venous stasis distally. No palpitations or syncope. Otherwise reviewed and negative.  Vital Signs:  Patient profile:   75 year old male Weight:      170 pounds BMI:     26.72 Pulse rate:   79 / minute BP sitting:   165 / 79  (left arm)  Vitals Entered By: Dreama Saa, CNA (May 04, 2010 1:05 PM)  Physical Exam  Additional Exam:  Chronically ill-appearing male in no acute distress, ambulates with walker. HEENT: Conjunctiva and lids are normal, oropharynx clear with poor dentition.  Neck: Supple, no loud carotid bruits. Jugular venous pressure approximately 10 cm of water. Lungs: Diminished breath sounds, otherwise clear without wheezing. Nonlabored breathing at rest.  Cardiac: Regular rate and rhythm, indistinct PMI, no S3 gallop. Abdomen: Protuberant, bowel sounds present, no tenderness. Extremities: Chronic appearing 1+ edema and venous stasis with diminished distal pulses. Skin: Warm and dry. Musculoskeletal: Mild kyphosis. Neuropsychiatric: Alert and oriented x3, affect appropriate.   EKG  Procedure date:  05/04/2010  Findings:      Normal sinus rhythm at 66 beats per minute with nonspecific  ST-T changes.  Prior Report Reviewed for Echocardiogram:  Findings: 05/24/2009 Study Conclusions            - Left ventricle: The cavity size was mildly dilated. Wall thickness       was increased in a pattern of moderate LVH. The estimated ejection       fraction was 55%.     - Aortic valve: Mild regurgitation.     - Mitral valve: Mild regurgitation.     - Left atrium: The atrium was mildly dilated.     - Atrial septum: No defect or patent foramen ovale was identified.     - Pulmonary arteries: PA peak pressure: 49mm Hg (S).  Comments:    Impression & Recommendations:  Problem # 1:  CORONARY ATHEROSCLEROSIS NATIVE CORONARY ARTERY (ICD-414.01)  Nonobstructive, no active angina.  His updated medication list for this problem includes:    Aspirin 81 Mg Tbec (Aspirin) .Marland Kitchen... Take one tablet by mouth daily    Nitroglycerin 0.4 Mg Subl (Nitroglycerin) ..... One tablet under tongue every 5 minutes as needed for chest pain---may repeat times three    Carvedilol 6.25 Mg Tabs (Carvedilol) .Marland Kitchen... Take 1 tablet two times a day    Lisinopril 5 Mg Tabs (Lisinopril) .Marland Kitchen... Take 1 tablet by mouth once daily    Amlodipine Besylate 10 Mg Tabs (Amlodipine besylate) .Marland Kitchen... Take one tablet by mouth daily  Problem # 2:  RENAL INSUFFICIENCY (ICD-588.9)  Followup BMET.  Problem # 3:  ESSENTIAL HYPERTENSION, BENIGN (ICD-401.1)  Blood pressure elevated. I have asked Mr. Tenorio to follow up with Dr. Margo Aye as he may need further medication adjustments over time. Most recently, amlodipine was advanced.  His updated medication list for this problem includes:    Aspirin 81 Mg Tbec (Aspirin) .Marland Kitchen... Take one tablet by mouth daily    Furosemide 40 Mg Tabs (Furosemide) .Marland Kitchen... Take1/2 tab daily    Carvedilol 6.25 Mg Tabs (Carvedilol) .Marland Kitchen... Take 1 tablet two times a day    Lisinopril 5 Mg Tabs (Lisinopril) .Marland Kitchen... Take 1 tablet by mouth once daily    Amlodipine Besylate 10 Mg Tabs (Amlodipine besylate) .Marland Kitchen... Take  one tablet by mouth daily  Problem # 4:  OTHER PRIMARY CARDIOMYOPATHIES (ICD-425.4)  Nonischemic cardiomyopathy with subsequent normalization of LVEF as of March 2011. Plan to followup echocardiogram to ensure stability. Not entirely certain about medication compliance.  His updated medication list for this problem includes:    Aspirin 81 Mg Tbec (Aspirin) .Marland Kitchen... Take one tablet by mouth daily    Furosemide 40 Mg Tabs (Furosemide) .Marland Kitchen... Take1/2 tab daily    Nitroglycerin 0.4 Mg Subl (Nitroglycerin) ..... One tablet under tongue every 5 minutes as needed for chest pain---may repeat times three    Carvedilol 6.25 Mg Tabs (Carvedilol) .Marland Kitchen... Take 1 tablet two times a day    Lisinopril 5 Mg Tabs (Lisinopril) .Marland Kitchen... Take 1 tablet by mouth once daily    Amlodipine Besylate 10 Mg Tabs (Amlodipine besylate) .Marland Kitchen... Take one tablet by mouth daily  Orders: 2-D Echocardiogram (2D Echo)  Problem # 5:  HYPERLIPIDEMIA (ICD-272.4)  LDL well controlled as of last August. Followup lipid panel and liver function tests.  His updated medication list for this problem includes:    Simvastatin 5 Mg Tabs (Simvastatin) .Marland Kitchen... Take 1 tablet by mouth once a day  Orders: T-Lipid Profile ([id number])  Other Orders: T-Comprehensive Metabolic Panel ([id number])  Patient Instructions: 1)  Your physician recommends that you schedule a follow-up appointment in: 6months 2)  Your physician recommends that you return for lab work in: this week 3)  Your physician recommends that you continue on your current medications as directed. Please refer to the Current Medication list given to you today. 4)  Your physician has requested that you have an echocardiogram.  Echocardiography is a painless test that uses sound waves to create images of your heart. It provides your doctor with information about the size and shape of your heart and how well your heart's chambers and valves are working.  This procedure takes approximately one  hour. There are no restrictions for this procedure.

## 2010-05-16 NOTE — Letter (Signed)
Summary: [place] Results Engineer, agricultural at [facility]  [address] [place] [address]   Phone: [phone]  Fax: [phone]      May 08, 2010 MRN: [id number]   [facility] [address] [place], [place]  [address]   Dear Mr. Zimny,  Your test ordered by Selena Batten has been reviewed by your physician (or physician assistant) and was found to be normal or stable. Your physician (or physician assistant) felt no changes were needed at this time.  ____ Echocardiogram  ____ Cardiac Stress Test  _x___ Lab Work  ____ Peripheral vascular study of arms, legs or neck  ____ CT scan or X-ray  ____ Lung or Breathing test  ____ Other:  Labwork looks good, LDL at goal> Creatinine down as well. Continue current medicaitons, per Dr. Diona Browner. Thank you, Yannick Steuber Allyne Gee RN    [place] Bing, MD, Lenise Arena.C.Gaylord Shih, MD, F.A.C.C Lewayne Bunting, MD, F.A.C.C Nona Dell, MD, F.A.C.C Charlton Haws, MD, Lenise Arena.C.C

## 2010-05-16 NOTE — Letter (Signed)
Summary: [place] Results Engineer, agricultural at [facility]  [address] [place] [address]   Phone: [phone]  Fax: [phone]      May 10, 2010 MRN: [id number]   [facility] [address] [place], [place]  [address]   Dear Mr. Meloni,  Your test ordered by Selena Batten has been reviewed by your physician (or physician assistant) and was found to be normal or stable. Your physician (or physician assistant) felt no changes were needed at this time.  __X__ Echocardiogram  ____ Cardiac Stress Test  ____ Lab Work  ____ Peripheral vascular study of arms, legs or neck  ____ CT scan or X-ray  ____ Lung or Breathing test  ____ Other:  Please continue on current medical treatment.  Thank you.   Nona Dell, MD, F.A.C.C

## 2010-08-01 NOTE — Discharge Summary (Signed)
[name] Rice, [name] Rice                ACCOUNT NO.:  [id number]   MEDICAL RECORD NO.:  [id number]          PATIENT TYPE:  INP   LOCATION:  2019                         FACILITY:  [facility]   PHYSICIAN:  Veverly Fells. Excell Seltzer, MD  DATE OF BIRTH:  05-13-1935   DATE OF ADMISSION:  01/13/2008  DATE OF DISCHARGE:  01/21/2008                               DISCHARGE SUMMARY   PRIMARY CARDIOLOGIST:  Dr. Nona Dell.   PRIMARY CARE Unita Detamore:  Previously, [facility] in [place] although  patient is seeking primary care in [place].   DISCHARGE DIAGNOSIS:  Acute nonischemic cardiomyopathy.   SECONDARY DIAGNOSES:  1. Nonobstructive coronary artery disease.  2. History of hypertension with hypotension this admission.  3. Chronic kidney disease, stage 3, status post arteriovenous fistula      placement, January 2009.  4. Chronic obstructive pulmonary disease with longstanding tobacco      abuse.  5. Normocytic anemia.  6. Bilateral lower extremity cellulitis, being treated with      antibiotics.  7. Bilateral conjunctivitis, having received antibiotics this      admission.  8. Reported history of paroxysmal atrial fibrillation.  9. Reported history of heme-positive stools.  10.Gastroesophageal reflux disease.  11.Asymptomatic bradycardia.  12.Secondary hyperparathyroidism.  13.Degenerative joint disease.  14.Conjunctivitis, treated with antibiotic this admission.  15.Severe tricuspid regurgitation.   ALLERGIES:  NO KNOWN DRUG ALLERGIES.   PROCEDURES:  Right and left heart cardiac catheterization performed on  January 16, 2008, revealing elevated filling pressures with  nonobstructive coronary artery disease.   HISTORY OF PRESENT ILLNESS:  A 75 year old Caucasian male with prior  history of COPD, hypertension, and longstanding tobacco abuse, as well  as stage 3 renal disease who presented to [facility] on January 11, 2008, with complaints of progressive lower extremity edema  and  dyspnea.  Patient was admitted to the internal medicine service and was  treated for lower extremity cellulitis with vancomycin therapy and was  also aggressively diuresed.  Bilateral lower extremity ultrasounds were  performed and were negative for DVT.  A 2D echocardiography was also  performed showing an EF of 15% to 20% with severe diffuse LV hypokinesis  and moderately increased left ventricular wall thickness.  Cardiology  was subsequently consulted and patient was seen by Dr. Diona Browner who  recommended transfer to [facility] for further evaluation.   HOSPITAL COURSE:  Following admission to [facility], patient remained  volume overloaded and he was again aggressively diuresed.  It was felt  he would benefit from IV vasodilator therapy and he was initiated on  nesiritide therapy on January 13, 2008.  With this, he had brisk  diuresis with reduction in weight, however, also developed hypotension  requiring cessation of nesiritide infusion.  Patient's renal function  had improved and he was felt stable for left and right heart cardiac  catheterization which was performed January 16, 2008.  This showed  nonobstructive coronary artery disease with elevated filling pressures  and a wedge of 23, cardiac output of 6.9, and index of 3.5.  With these  findings, it was felt that  the patient would require further diuresis.  He was subsequently placed back on intravenous Lasix and continued to  diurese with stable renal function.  Overall, we were able to get his  weight down from 81.6 kg on January 17, 2008, to 74.5 kg today on  discharge.   Throughout hospitalization, patient was maintained on carvedilol and low-  dose lisinopril therapy.  Unfortunately, we have had to discontinue his  lisinopril secondary to hypotension with systolic pressures sometimes  into the 60s and high 50s.  Further, we discontinued carvedilol in favor  of low-dose Toprol XL which we felt he would tolerate  better from a  blood pressure standpoint.   Patient was initially maintained on intravenous vancomycin for lower  extremity cellulitis, however, this was subsequently changed to oral  Augmentin therapy.  He has been maintained on Augmentin 500 mg b.i.d.  and has 5 more days of therapy to complete.   [name] Rice has been evaluated by physical therapy and it is felt that he  would benefit from at least short-term rehabilitation.  We have made  arrangements for him to be transferred to a skilled nursing facility  today.  He will be going to [facility] in [place].   DISCHARGE LABS:  Hemoglobin 11.5, hematocrit 34.6, WBC 6.0, platelets  118, MCV 90.7, sodium 137, potassium 4.7, chloride 99, CO2 of 31, BUN  32, creatinine 1.6, glucose 131, total bilirubin 1.2, alkaline  phosphatase 100, AST 27, ALT 25, albumin 2.7, calcium 8.9.  BNP was 1318  on January 20, 2008.   DISPOSITION:  Patient will be discharged to skilled nursing facility  today in  good condition.   FOLLOWUP PLANS AND APPOINTMENTS:  We have arranged for him to follow up  with Dr. Ival Bible P.A., Tereso Newcomer, on February 02, 2008, at 1 p.m.  Patient will need a BMET and digoxin level at the time of that visit.  Patient is in the process of establishing primary care in [place].   DISCHARGE MEDICATIONS:  1. Augmentin 500 mg b.i.d. x5 additional days.  2. Toprol XL 25 mg 1/2 tablet daily.  3. Digoxin 0.125 mg every other day.  4. Aspirin 81 mg daily.  5. Simvastatin 5 mg q.h.s.  6. Lasix 40 mg b.i.d.  7. K-Dur 20 mEq daily.  8. Protonix 40 mg daily.  9. Nitroglycerin 0.4 mg sublingual p.r.n. chest pain.   Patient has been seen by wound care and they have recommended Mepilex  Lite to the left calf wound as needed.   OUTSTANDING LAB STUDIES:  Followup BMET and digoxin level on February 02, 2008.   DURATION OF DISCHARGE ENCOUNTER:  60 minutes including physician time.      Nicolasa Ducking, ANP      Veverly Fells.  Excell Seltzer, MD  Electronically Signed    CB/MEDQ  D:  01/21/2008  T:  01/21/2008  Job:  [id number]

## 2010-08-01 NOTE — Assessment & Plan Note (Signed)
[facility]                        CARDIOLOGY OFFICE NOTE   NAME:CREWSSkanda, Worlds                         MRN:          [id number]  DATE:02/02/2008                            DOB:          [birth year]    CARDIOLOGIST:  Jonelle Sidle, MD   PRIMARY CARE PHYSICIAN:  None.  He currently is followed by Dr. Felecia Shelling at  [facility].   REASON FOR VISIT:  Posthospitalization followup.   HISTORY OF PRESENT ILLNESS:  Mr. Nuno is a 75 year old male patient who  was recently evaluated by Dr. Diona Browner at [facility] and  subsequently transferred to [facility] secondary to acute  systolic congestive heart failure.  His ejection fraction was  demonstrated to be 15-20% by echocardiography.  He had concomitant lower  extremity cellulitis in the setting of significant lower extremity  edema.  Of note, his echocardiogram also demonstrated moderate mitral  regurgitation, mild RV dysfunction with mildly increased pulmonary  pressures of 39 mmHg, and severe tricuspid regurgitation.  At [facility], he was treated with IV nesiritide and IV Lasix.  He apparently  developed significant hypotension, which necessitated discontinuation of  nesiritide.  He eventually went for cardiac catheterization that  demonstrated what was felt to be nonobstructive disease.  Specifically,  he had a 70% mid LAD stenosis and 80% ostial second diagonal branch  stenosis, and 30% mid RCA stenosis.  He had moderate elevation in his  pulmonary pressure rated at 49/27 with a mean of 35 and his wedge  pressure was 28 with a mean of 23.  It was felt that he had nonischemic  cardiomyopathy and continued therapy was pursued.  This patient's  carvedilol was switched to low-dose Toprol secondary to hypotension.  ACE inhibitor therapy was also discontinued secondary to hypotension.  He completed antibiotic therapy for his lower extremity cellulitis and  was  discharged to [facility].   In the office today, the patient notes that he is overall doing well.  He continues to have dyspnea with exertion.  There has really been no  significant change since prior to admission to the hospital in his  breathing.  He describes probable NYHA class III symptoms.  He denies  orthopnea.  He does occasionally awaken short of breath.  It is  questionable whether or not this is true paroxysmal nocturnal dyspnea.  His lower extremity edema is reduced and his weight has also gone down  considerably since discharge from the hospital.  He denies any chest  pain.  He denies any syncope.   It should be noted that the patient has a significant history of chronic  renal insufficiency.  He does have a history of acute renal failure  requiring dialysis for a short period of time in December 2008.  He  still has a left AV fistula in place.  His creatinine did remain stable  throughout his admission with his highest creatinine measured at 1.75.   The patient also has significant history of COPD.  He uses nebulizers at  home, but he is currently not receiving any  nebulizer therapy at the  nursing home.  He does have pursed-lip breathing on exam today.   Followup labs obtained on January 29, 2008, demonstrated albumin at  3.4, digoxin level of 0.8, hemoglobin 12.6, potassium 4.8, BUN 30,  creatinine 1.49.   CURRENT MEDICATIONS:  Protonix 40 mg daily, Toprol-XL 25 mg half tablet  daily, Digoxin 0.125 mg every other day, Aspirin 81 mg daily,  Simvastatin 5 mg nightly, K-Dur 20 mEq daily,  Lasix 40 mg b.i.d., Nitroglycerin p.r.n. chest pain.   PHYSICAL EXAMINATION:  GENERAL:  He is a chronically ill-appearing male  in no distress.  VITAL SIGNS:  Blood pressure is 115/72, pulse 76, weight 155 pounds.  This is down from 163.9 pounds at discharge from the hospital.  HEENT:  Normal.  NECK:  Without JVD at 90 degrees.  CARDIAC:  Normal S1 and S2.  Distant heart  sounds.  Regular rate and  rhythm with a 2/6 systolic murmur heard best along the left sternal  border.  LUNGS:  Decreased breath sounds.  He does have bibasilar crackles.  No  egophony.  ABDOMEN:  Soft, nontender.  EXTREMITIES:  1+ brawny edema with chronic stasis changes noted.  VASCULAR:  Right femoral arteriotomy site without hematoma or bruit.  NEUROLOGIC:  He is alert and oriented x3.  Cranial nerves II-XII grossly  intact.   ASSESSMENT AND PLAN:  1. Chronic systolic congestive heart failure secondary to a      nonischemic cardiomyopathy with an ejection fraction of 15-20% in      the setting of moderate mitral regurgitation.  In addition, he has      evidence of pulmonary hypertension and mild right ventricular      dysfunction in the setting of significant chronic obstructive      pulmonary disease (i. e. cor pulmonale).  From a volume standpoint,      he seems to be stable.  We have had difficulty adjusting his      medications secondary to hypotension.  His blood pressure looks      much better today.  We will continue him on low-dose Toprol at this      point.  I have recommended that we try to get him on lisinopril 2.5      mg one-half tablet daily.  We will keep a close eye on his renal      function and potassium.  BMET will be drawn on Thursday of this      week along with a BNP.  A repeat BMET and BNP will be done next      Thursday as well.  He will be brought back in close followup in the      next couple of weeks.  2. Chronic obstructive pulmonary disease with evidence of pulmonary      hypertension and some right ventricular dysfunction on      echocardiogram.  I have written for him to receive albuterol and      Atrovent nebulizers while he is at the nursing home.  I have also      referred him to Dr. Juanetta Gosling for further assistance in management of      his chronic obstructive pulmonary disease.  The patient is in need      of a primary care doctor.  It is  possible that he could establish      with Dr. Juanetta Gosling for both.  I will leave that up to Dr. Juanetta Gosling.  3. Nonobstructive coronary  artery disease.  As outlined above, he did      have 70% lesion in the left anterior descending coronary artery and      80% lesion in the diagonal branch.  He is being treated medically      at this time.  He will continue on aspirin.  4. Dyslipidemia.  He is currently on simvastatin low-dose.  His recent      lipid panel was optimal with an LDL of 71.  Continue on this and we      will need to make sure he has a followup with his LFTs in the next      several months.  5. Chronic normocytic anemia.  This seems to be stable.  His recent      lab work demonstrated a hemoglobin of 12.6.  6. Chronic lower extremity edema with recent episode of bilateral      cellulitis.  His legs look good on exam today and he is not      reporting any fevers.  He does have chronic stasis changes, but I      do not see any evidence of ongoing infection today.  7. Chronic renal insufficiency.  As noted above, the patient will have      a followup labs this week and next week to follow his creatinine      while we try to start him on an angiotensin-converting enzyme      inhibitor.   DISPOSITION:  The patient will be brought back in followup in the next 2  weeks with myself or Dr. Diona Browner or sooner p.r.n.      Tereso Newcomer, PA-C  Electronically Signed      Gerrit Friends. Dietrich Pates, MD, [facility]  Electronically Signed   SW/MedQ  DD: 02/02/2008  DT: 02/03/2008  Job #: [id number]   cc:   Tesfaye D. Felecia Shelling, MD

## 2010-08-01 NOTE — Assessment & Plan Note (Signed)
[facility]                       [place] CARDIOLOGY OFFICE NOTE   NAME:[name] Rice, [name]                         MRN:          [id number]  DATE:07/16/2008                            DOB:          [birth year]    CARDIOLOGIST:  Jonelle Sidle, MD   PRIMARY CARE PHYSICIAN:  Catalina Pizza, MD   REASON FOR VISIT:  One-month followup.   PRESENT ILLNESS:  Mr. Rice is a 75 year old male patient with history  nonischemic cardiomyopathy with EF of 15-20% and moderate CAD with 70%  mid LAD stenosis, 80% ostial diagonal to stenosis in October 2009, who  presents to the office today for followup.  He was last seen in March,  at that time, his blood pressure was able to tolerate adjustments in his  medications.  In the office today, he denies any problems with chest  discomfort.  He denies any significant increase in dyspnea on exertion.  He still walks with his walker.  He denies orthopnea or paroxysmal  nocturnal dyspnea.  His lower extremity edema is stable without  significant change.  His weight is also stable.  He denies syncope.  He  does need a refill on the Spiriva.  He apparently was not able to get  this when he left the nursing home.  He also has significant problems  with onychomycosis and is asking for help with trimming his nails.   CURRENT MEDICATIONS:  1. Toprol-XL 25 mg half tablet daily.  2. Aspirin 81 mg daily.  3. Potassium 20 mEq daily.  4. Lasix 40 mg daily.  5. Spiriva 18 mcg daily.  6. Lisinopril 2.5 mg daily.  7. Simvastatin 5 mg nightly.  8. Nitroglycerin p.r.n. chest pain.   PHYSICAL EXAMINATION:  GENERAL:  He is a chronically ill-appearing male  in no acute stress, who walks with a walker and does have pursed-lip  breathing at times, which is chronic for him.  VITAL SIGNS:  Blood pressure is 169/76, pulse 65, weight 161 pounds  which is up 1 pound since last visit.  HEENT:  Normal.  NECK:  I cannot appreciate JVD at 90  degrees.  CARDIAC:  Normal S1 and S2.  Regular rate and rhythm.  LUNGS:  Clear to auscultation bilaterally.  No rales.  ABDOMEN:  Soft and nontender.  EXTREMITIES:  Trace to 1+ edema bilaterally.  His feet demonstrate some  early-type maceration on the plantar surface.  His nails are thickened  and yellow and quite long and twisted which is consistent with  onychomycosis.  He does not have any obvious skin breakdown or  ulcerations.  NEUROLOGIC:  He is alert and oriented x3.  Cranial nerves II-XII grossly  intact.   ASSESSMENT AND PLAN:  1. Nonischemic cardiomyopathy with an ejection fraction 15-20%.  The      patient has biventricular failure, pulmonary hypertension, severe      tricuspid regurgitation, moderate mitral regurgitation.  He seems      to be stable from a volume standpoint.  His blood pressure can      certainly tolerate adjustments in his heart failure medications.  I      will have him to stop his Toprol and start on carvedilol 6.25 mg      half tablet b.i.d.  We will also increase his lisinopril to 5 mg      daily.  He will have a blood pressure check and an EKG with a nurse      in a week.  At that time, if his heart rate and blood pressure is      stable, we will increase in to 6.25 mg of carvedilol twice daily.      His last set of blood work in early April demonstrated a BUN of 28      and creatinine of 1.79.  This has been steadily increasing since      November when his creatinine was 1.36.  Overall, his volume seems      to be stable, but he probably does need some diuretic.  I am going      to try to decrease his Lasix 20 mg a day.  He will have a BMET      today and followup BMET and BNP in a week.  If his BNP should go up      from his last level of 257 or if he begin to have increasing edema      or weight, we will have to maintain him on 40 mg a day of Lasix.      If his creatinine remains stable, will be able to continue to      titrate his ACE inhibitor  as well.  2. Chronic kidney disease.  As outlined above, his creatinine recently      was 1.79.  We will check labs as outlined above to follow up on his      renal function and potassium.  I do not believe he would be a good      candidate for Aldactone therapy.  3. Hypertension.  As outlined above, we are adjusting his medications.      We could eventually consider nitrates and hydralazine for treatment      of his cardiomyopathy as well as his blood pressure.  4. Coronary artery disease as outlined.  He is not having any symptoms      of angina.  5. Chronic obstructive pulmonary disease.  He needs his Spiriva      refilled.  We will refill that for him today and he can further      follow up with Dr. Margo Aye.   DISPOSITION:  He will follow up with a nurse in a week as outlined  above.  He will also follow up with me or Dr. Diona Browner in 1 month.      Tereso Newcomer, PA-C  Electronically Signed      Gerrit Friends. Dietrich Pates, MD, [facility]  Electronically Signed   SW/MedQ  DD: 07/16/2008  DT: 07/17/2008  Job #: [id number]   cc:   Catalina Pizza, M.D.

## 2010-08-01 NOTE — Cardiovascular Report (Signed)
NAMEMAXMILIAN, [name] Rice                ACCOUNT NO.:  [id number]   MEDICAL RECORD NO.:  [id number]          PATIENT TYPE:  INP   LOCATION:  2019                         FACILITY:  [facility]   PHYSICIAN:  Everardo Beals. Juanda Chance, MD, FACCDATE OF BIRTH:  10/02/1935   DATE OF PROCEDURE:  01/16/2008  DATE OF DISCHARGE:                            CARDIAC CATHETERIZATION   CLINICAL HISTORY:  Mr. Gillooly is 75 years old and was recently admitted  to [facility] with congestive heart failure.  He was found to  have an ejection fraction of 15-20%.  He was transferred here and was  seen by Dr. Shirlee Latch and treated with nesiritide and Lasix drip with good  diuresis.  He was scheduled for cath today for evaluation for ischemic  versus nonischemic cardiomyopathy.   PROCEDURE:  Right heart catheterization was performed percutaneously  through the right femoral vein using a venous sheath and Swan-Ganz  thermodilution catheter. Left heart catheterization was performed  percutaneously through the right femoral artery, an arterial sheath and  6-French preformed coronary catheter.  A femoral arterial puncture was  performed, and Omnipaque contrast was used.  We had some difficulty  manipulating the catheters due to tortuosity in the iliac vessels.  We  had used a wire to help with the manipulation of the catheters.  The  right coronary also had an anterior takeoff.  We used a AL-1 catheter  for selective engaging the right coronary artery.  No left  ventriculogram was performed due to the patient's renal insufficiency.  The patient tolerated the procedure well and left the laboratory in  satisfactory condition.   RESULTS:  The left main coronary artery:  The left main coronary artery  was free of disease.   Left anterior descending artery:  Left anterior descending artery gave  rise to two diagonal branches and three septal perforators.  There was  70% narrowing in the mid LAD just after the two diagonal  branches.  There was 80% ostial stenosis in the second diagonal branch.   The circumflex artery:  The circumflex artery was a small vessel, gave  rise to marginal branch and a small AV branch.  These vessels were  irregular and free of major obstruction.   The right coronary artery:  The right coronary artery was a  large  vessel that supplied a conus branch, ventricular branch, large posterior  descending, and two small and one large posterolateral branch.  There  was a long 30% narrowing in the mid-right coronary artery.  This vessel  is free of major obstruction.   No left venogram was performed.   HEMODYNAMIC DATA:  Right pressure was 22 mean.  Pulmonary artery  pressure was 49/27 with a mean of 35.  Pulmonary wedge pressure was 23  mean.  Left ventricular pressure was 108/23.  The aortic pressure was  108/70 with mean of 86.  Cardiac output/cardiac index was 6.9/3.5  liters/minute/meter squared by Fick.   CONCLUSION:  1. Nonobstructive coronary artery disease with 7 cm narrowing in the      mid LAD and 80% ostial stenosis of the  second diagonal branch, no      significant obstruction in circumflex artery and 30% narrowing in      the mid right coronary artery.  2. Severe left ventricular function by noninvasive studies with an      estimated ejection fraction of 15-20%.  3. Moderate elevation of pulmonary artery pressure (49/27 with a mean      of 35) and pulmonary wedge pressure (23 mean).   RECOMMENDATIONS:  Continued medical therapy.      Bruce Elvera Lennox Juanda Chance, MD, [facility]  Electronically Signed     BRB/MEDQ  D:  01/16/2008  T:  01/16/2008  Job:  [id number]   cc:   Jonelle Sidle, MD

## 2010-08-01 NOTE — Op Note (Signed)
[name] Rice, EIFLER                ACCOUNT NO.:  [id number]   MEDICAL RECORD NO.:  [id number]          PATIENT TYPE:  INP   LOCATION:  6708                         FACILITY:  [facility]   PHYSICIAN:  Janetta Hora. Fields, MD  DATE OF BIRTH:  17-Feb-1936   DATE OF PROCEDURE:  03/27/2007  DATE OF DISCHARGE:                               OPERATIVE REPORT   PROCEDURE:  Left brachiocephalic AV fistula.   PREOPERATIVE DIAGNOSIS:  Endstage renal disease.   POSTOPERATIVE DIAGNOSIS:  Endstage renal disease.   ANESTHESIA:  Local with IV sedation.   ASSISTANT:  Delight Hoh, RNFA.   OPERATIVE FINDINGS:  A 3 mm left cephalic vein.   OPERATIVE DETAILS:  After obtaining informed consent, the patient was  taken to the operating.  The patient was placed in the supine position  on the operating table.  After adequate sedation, the patient's entire  left upper extremity was prepped and draped in the usual sterile  fashion.  Local anesthesia was infiltrated near the antecubital crease.  A transverse incision was made in this location, carried down through  subcutaneous tissues down to the level of the cephalic vein.  Cephalic  vein was approximately 2.5 to 3 mm in diameter.  It was dissected free  circumferentially and small side branches ligated and divided between  silk ties.  The brachial artery was then dissected free in the medial  portion of the incision.  This was encircled with vessel loops proximal  and distal to the planned site of arteriotomy.  The patient was then  given 5000 units of intravenous heparin.  The distal cephalic vein was  doubly ligated with two clips.  Vein was then transected, thoroughly  flushed with heparinized saline, distended gently and swung over to the  level of the brachial artery.  Brachial artery was controlled proximally  and distally with vessel loops.  A small longitudinal arteriotomy was  made.  Vein was then sewn end of vein to side of artery using a running  7-0  Prolene suture.  Just prior to completion anastomosis, forward bled,  back bled and thoroughly flushed.  Anastomosis was secured.  Vessel  loops were released.  There was palpable thrill in the proximal fistula  immediately.  Next hemostasis was obtained.  Subcutaneous tissues were  reapproximated using running 3-0 Vicryl suture.  Skin was closed with  interrupted nylon sutures.  The patient tolerated the procedure well and  there were no complications.  Instrument, sponge and needle counts were  correct at the end of the case.  The patient had a palpable radial pulse  at the end of the case.      Janetta Hora. Fields, MD  Electronically Signed     CEF/MEDQ  D:  03/27/2007  T:  03/27/2007  Job:  [id number]

## 2010-08-01 NOTE — Consult Note (Signed)
[name] Rice, [name] Rice                ACCOUNT NO.:  [id number]   MEDICAL RECORD NO.:  [id number]          PATIENT TYPE:  INP   LOCATION:  A305                          FACILITY:  [facility]   PHYSICIAN:  Jonelle Sidle, MD DATE OF BIRTH:  [birth year]   DATE OF CONSULTATION:  DATE OF DISCHARGE:                                 CONSULTATION   REFERRING PHYSICIAN:  [facility].   REASON FOR CONSULTATION:  Progressive lower extremity edema and newly  diagnosed cardiomyopathy.   HISTORY OF PRESENT ILLNESS:  [name] Rice is a 75 year old male with a  history of hypertension, chronic obstructive pulmonary disease,  longstanding tobacco abuse, and presentation back in December of 2008  with acute renal failure following a fall at home.  At that particular  time he was noted to be relatively hypotensive and was being treated  with Micardis for hypertension.  He was initially managed at [facility] and had BUN greater than 225 with a potassium of  6.6.  He initially required hemodialysis for stabilization and had an  element of both severe volume depletion/dehydration, as well as  metabolic acidosis and hypoalbuminemia noted at the time of his acute  renal insufficiency.  He ultimately underwent placement of a left  brachial cephalic AV fistula by Janetta Hora. Fields, MD in January and  received hemodialysis for some period of time, although the patient  states now that he has not been on any regular hemodialysis for several  months.  He is now admitted to the hospital from [facility] with  progressive lower extremity edema, at least over the last year and more  recently a 2-week history of weeping wounds involving both lower  extremities.  He has been diagnosed with a probable cellulitis and is  being treated with antibiotics at this time, as well as dressing  changes.  He provides a very limited history which is vague.  He states  that he occasionally has some chest pain,  but this is not necessarily  associated with exertion, which is basically limited to ambulating  around his home with a walker.  He does complain of shortness of breath  describing NYHA class III symptoms.  At presentation, his BUN and  creatinine were 35 and 1.7 and have remained relatively stable.  He has  had a fairly mild diuresis despite escalating doses of intravenous  Lasix.  BNP level has increased from 1160 up to 1460.  Otherwise,  cardiac markers have been mildly abnormal with a peak troponin-I level  of 0.65 and otherwise normal CK-MB relative indices.  He has undergone  lower extremity venous Doppler demonstrating no evidence of deep venous  thrombosis and his baseline chest x-ray demonstrated mild interstitial  edema with bilateral pleural effusions, relatively small, and  cardiomegaly.  Follow-up echocardiography demonstrates a severe myopathy  with a left ventricular ejection fraction of 15-20%, associated with  severe diffuse left ventricular hypokinesis and moderately increased  left ventricular wall thickness.  Otherwise, he has mild aortic  regurgitation, moderate mitral regurgitation, mild right ventricular  dysfunction associated with mildly increased  pulmonary artery pressures  of 39 mmHg, and severe tricuspid regurgitation.  We have been asked to  assist with his management.   ALLERGIES:  No known drug allergies.   MEDICATIONS AT HOME:  Reportedly included:  1. Lasix 40 mg p.o. daily.  2. A number of other pills which the patient cannot recall.   At the present time he is on:  1. Aspirin 81 mg p.o. daily.  2. Ciprofloxacin eye drops q.4h.  3. Lovenox 40 mg subcutaneous daily.  4. Lasix 80 mg IV q.12h.  5. Levaquin 250 mg IV q.24h.  6. Protonix 40 mg p.o. daily.  7. Senokot one p.o. nightly.  8. Vancomycin 1 gram IV q.24h.  9. Ventolin 2.5 mg inhaled q.4h. p.r.n.   PAST MEDICAL HISTORY:  Described above.  Additional problems include:  1. Reported  paroxysmal atrial fibrillation.  2. Multifactorial anemia with previously documented heme-positive      stools.  3. Previous pneumonia.  4. Gastroesophageal reflux disease.  5. Asymptomatic bradycardia.  6. Hyperparathyroidism associated with renal insufficiency.  7. Previously documented hypoalbuminemia in the setting of acute      pneumonia.  8. Hypertension.  9. Degenerative joint disease.  10.Stasis dermatitis of the lower extremities.   SOCIAL HISTORY:  The patient lives alone in an apartment in [place].  He has a significant smoking history, although states that he quit 1  year ago.  Denies any alcohol use.  He ambulates about his home with a  walker.  No other family present.   FAMILY HISTORY:  It was reviewed.  No obvious history of chronic renal  disease.  The patient states he has a brother who had a history of  cancer who was treated with chemotherapy.  No obvious premature  cardiovascular disease or familial cardiac cardiomyopathy based on a  fairly limited information.   REVIEW OF SYSTEMS:  [name] Rice denies having any fevers or chills, cough  or hemoptysis.  He does have orthopnea, significant progressive lower  extremity edema, NYHA class III, dyspnea exertion, occasional sporadic  chest pain nonexertional, occasional palpitations,but no syncope.  He  had a recent fall, but denied any loss of consciousness.  No obvious  melena or hematochezia.  Otherwise, negative.   PHYSICAL EXAMINATION:  Temperature 98.4 degrees, heart rate is in the 90-  100 range in sinus rhythm with premature atrial complexes.  He is not on  telemetry at this time.  Respirations 20, systolic blood pressure  ranging from 101-120 over diastolic 63-74, oxygen saturation is 96% on 3  L nasal cannula, weight is 86 kg up from 83 kg at presentation.  He has  had approximately 650 mL out more than in over the last 24 hours  This is a disheveled 75 year old male lying in bed.  Non-breathless at   rest without any active chest pain.  HEENT:  Conjunctivae and lids normal.  Oropharynx clear with very poor  dentition.  NECK:  Supple.  Increased jugulovenous pressure, approximately 15 cm of  water.  No loud carotid bruits.  No obvious thyromegaly.  LUNGS:  Exhibit diminished breath sounds, particularly at the bases.  No  rales or wheezing noted at this time.  CARDIAC EXAM:  Reveals a distant regular rate and rhythm with occasional  ectopic beat.  A 2/6 systolic murmur heard mainly at the apex.  Indistinct PMI, somewhat laterally displaced.  No pericardial rub.  ABDOMEN:  Soft, nontender.  Liver edge palpable below costal margin.  Bowel sounds are  present.  No guarding.  EXTREMITIES:  Exhibit significant chronic woody appearing edema.  There  are weeping ulcerations on both lower extremities with bandages in  place.  There is erythema present and an element of venous stasis.  Distal pulses are very difficult to palpate given the level of edema.  MUSCULOSKELETAL:  No kyphosis is noted.  NEUROPSYCHIATRIC:  The patient is alert and oriented times three.  Affect is somewhat flat.   LABORATORY DATA:  WBC 8.2, hemoglobin 11.9, hematocrit 35.7, platelets  129.  Sodium 136, potassium 4.2, chloride 102, bicarb 27, glucose 111,  BUN 42, creatinine 1.79.  Peak CK 120, peak CK-MB 2.5, peak relative  index 2.3, peak troponin-I of 0.65.  LDL 73, HDL 51. TSH 4.14.  Urinalysis with trace ketones, 30 protein, positive nitrites, trace  leukocytes, few squamous epithelial cells, many bacteria.  Blood  cultures obtained and are negative at this point.  Urine culture greater  than 100,000 colonies of Escherichia coli with sensitivities pending.   A 12-lead electrocardiogram from October 26 showed sinus rhythm at 97  beats per minute with occasional premature atrial and ventricular  complexes.  There is decreased anterior R-wave progression, although  without frank Q-waves.  Borderline low voltages noted  in the limb leads.  The corrected QT interval was 464 milliseconds with fairly diffuse  nonspecific ST-T wave changes.   IMPRESSION:  1. Severe cardiomyopathy with a left ventricular ejection fraction of      15-20% associated with diffuse left ventricular hypokinesis and no      focal wall motion abnormalities.  [name] Rice has acute on chronic      systolic heart failure complicated by right heart failure symptoms      and significant lower extremity edema.  He does have right      ventricular dysfunction with severe tricuspid regurgitation      documented as well, although his pulmonary artery pressures were      only mildly increased.  He has manifested a mild diuresis at this      point, although his weight is increasing as is his beta-natriuretic      peptide, in the face of renal insufficiency at baseline.  He gives      a vague history of occasional chest pain, but nothing obviously      precipitating this event and in fact reports progressive lower      extremity edema perhaps over the last year.  His cardiac markers      are rather nonspecific and it may well be that this represents a      nonischemic myopathy in the setting of his other comorbid illness.  2. History of acute renal failure back in January of this year.      Please see full details from the [facility] admission at that time,      as well as a consultation from nephrology.  He has not required      long-term hemodialysis, although does have a left arm fistula in      place with a soft palpable thrill in that area.  His creatinine at      this point is 1.86 with a GFR of 38.  3. Urinary tract infection, Escherichia coli growing on urine culture.  4. Possible lower extremity cellulitis with weeping wounds in the      setting of fairly marked lower extremity edema and stasis      dermatitis.  Blood cultures are negative at  this point.  5. Mild thrombocytopenia.  6. LDL cholesterol of 73.  7. Chronic multifactorial  anemia, hemoglobin most recently 11.9.  8. Reported recent history of conjunctivitis, on Cipro eye drops.   RECOMMENDATIONS:  I reviewed the situation with the patient, as well the  [facility] staff.  [name] Rice clearly has a number of comorbid  illnesses and remains significantly volume overloaded, although  relatively hemodynamically stable.  He is on minimal if any cardiac  medications at this time and certainly needs further titration.  Our  plan is to transfer [name] Rice to the step-down unit at [facility] in anticipation of a congestive heart failure service  consultation.  I plan to place him on low dose milrinone and continue  Lasix at this point, perhaps advancing this to a Lasix infusion if his  urine output does not pick up.  Ultimately, when his volume status  begins to improve a beta-blocker can be considered (if not limited by  his lung disease) and we can also make further determination about  afterload reduction based on his subsequent renal function and blood  pressure.  Clearly,  his urinary tract infection, cellulitis and conjunctivitis need to be  addressed and a wound care consult should also be obtained.  Right heart  catheterization plus/minus coronary angiography can ultimately be  considered, assuming these other issues stabilize first.  Further plans  to follow.      Jonelle Sidle, MD  Electronically Signed     SGM/MEDQ  D:  01/13/2008  T:  01/13/2008  Job:  [id number]   cc:   [facility]

## 2010-08-01 NOTE — Discharge Summary (Signed)
[name] Rice, [name] Rice                ACCOUNT NO.:  [id number]   MEDICAL RECORD NO.:  [id number]          PATIENT TYPE:  INP   LOCATION:  6213                         FACILITY:  [facility]   PHYSICIAN:  Eliseo Gum, M.D.   DATE OF BIRTH:  1936/03/12   DATE OF ADMISSION:  03/17/2007  DATE OF DISCHARGE:                               DISCHARGE SUMMARY   DISCHARGE DIAGNOSES:  1. Acute on chronic renal failure.      a.     Newly diagnosed end-stage renal disease.      b.     Hemodialysis-dependent.  2. Paroxysmal atrial fibrillation.  3. Hyperkalemia secondary to #1.  4. Chronic obstructive pulmonary disease.  5. Severe volume depletion/dehydration.  6. Metabolic acidosis secondary to #1.  7. Hypernatremia secondary to #1.  8. Multifactorial anemia,      a.     Heme positive stool, probable ischemic colitis.      b.     Anemia of chronic disease secondary to #1.  9. Pneumonia.  10.Gastroesophageal reflux disease.  11.Hypotension secondary to hemodialysis.  12.Asymptomatic bradycardia.  13.Hyperparathyroidism secondary to #1.  14.Protein dissociation with hypoalbuminemia secondary to acute phase      response to pneumonia.  15.History of hypertension.  16.Degenerative joint disease.  17.Dermatitis rash lower extremities.   DISCHARGE MEDICATIONS:  1. PhosLo 667 mg 1 tablet p.o. t.i.d. with meals.  2. Aranesp to be given at the dialysis center.  3. Spiriva 18 mcg 1 inhalation daily.  4. Protonix 40 mg 1 tablet p.o. daily for reflux.  5. Zemplar to be given at dialysis.  6. MiraLax daily as needed for constipation.  7. Albuterol inhaler to use every 4 hours as needed for shortness of      breath.   DISPOSITION AND FOLLOWUP:  [name] Rice is being discharged home in stable  and improved condition.  He will now need hemodialysis for renal  failure.  The details of his dialysis are being coordinated by [employer]. He will be receiving dialysis of [facility] Monday, Wednesday, Friday. At home, he will be receiving physical  therapy.  He will follow up with his primary care doctor, Dr. Lorin Picket, in  [place] per [facility].  He has been given information regarding  a renal diet.  He has been advised to increase his activity slowly.  He  will be assisted at home by his niece and brother.   CONSULTATIONS:  [facility], Dr. Brandy Hale, MD.   PROCEDURES:  1. Placement of left brachiocephalic AV fistula by [facility].  The graft is not to be used for 6 weeks      from the date of procedure which was March 27, 2007.  2. Dr. Jordan Hawks. Elnoria Howard, MD, gastroenterology, colonoscopy was      performed given heme-positive stools.  A colonic ulcer was seen on      the exam and felt to be likely ischemic at the onset of      hemodialysis.  There were 1 cm  superficial ulcerations in the      cecum. There was no active bleeding.  Biopsies did not indicate      malignancy.  3. Peripheral venous study:  The right cephalic vein was thrombosed      from the elbow on the left to just above the elbow.  4. Fluoroscopic swallow evaluation. A swallowing function study was      performed.  The patient did have some dysphasia and has been      approved for a mechanical soft thin liquid diet without any      evidence of aspiration.  5. Transthoracic echocardiogram:  On March 19, 2007, a      transthoracic 2-D echo study was performed. Findings include a      dilated left ventricle with ejection fraction estimated to be 55%,      left ventricular wall thickness was mild to moderately increased.      The aortic valve was mildly calcified, aortic root dilation was      noted.  There was mild mitral valve regurgitation, the left atrium      was mildly dilated. Findings consistent with some mild diastolic      heart dysfunction.  6. Renal ultrasound:  Mild echogenicity of bilateral kidneys with no       obstruction noted.   HOSPITAL COURSE BY PROBLEM:  1. Acute on chronic renal failure:  [name] Rice was transferred from      [facility] where he was seen in the emergency      department there after being found down at home by his niece.  He      had no reported prior medical problems other than hypertension at      the time.  He did not get a regular primary care.  Once evaluated      at [facility] emergency department, he was noted to      have a creatinine of 23 and a BUN greater than 225 with a potassium      of 6.6 and a sodium greater than 150. At the time, his mental      status was alert and oriented x3.  He did not appear to be overtly      uremic. It is felt that the etiology of the acute renal failure was      secondary to severe volume depletion from his deconditioning and a      concomitant pneumonia.  He was also on an ARB and Lasix.  The      primary care records indicate that he had a presumed right heart      failure and some diastolic dysfunction for which he was being      diuresed.  The combination of the diuretic with the infection and      the poor p.o. intake ultimately lead to his renal failure. Prerenal      failure then developed into an intrarenal process with likely      irreversible kidney damage.  On the third day of hospitalization, a      hemodialysis catheter was placed and [name] Rice was dialyzed.  It      was felt initially that he needed sufficient volume replacement      with the hopes that we would see some decline in his serum      creatinine; however, electrolyte abnormalities and signs and      symptoms of uremia lead [place] to initiate  hemodialysis.  [name] Rice had      minimal problems with hemodialysis except for 2 days prior to      discharge developed some hypotension and symptomatic bradycardia.      He had a complete workup for chronic kidney disease and acute renal      failure. This included a negative renal ultrasound  for kidney      obstruction, negative workup for multiple myeloma given his protein      dissociation, negative urine for casts, negative urine studies for      complement. Serologies to rule out pulmonary renal syndrome were      negative. SPEP, UPEP were negative. Essentially all tests ordered      in order to elicit a cause for the severe and acute onset of his      renal failure have been negative.  At this time, the true etiology      is unknown and is certainly obscured by his presentation and the      intervention since that time.  He did not receive regular primary      care and therefore we cannot establish that there was not already a      chronic kidney disease in process.  He will likely need a permanent      hemodialysis.  2. Metabolic derangements:  Mr. Kresse' metabolic derangements were      secondary to his renal failure. His hyperkalemia resolved with      Kayexalate and hemodialysis. His severe metabolic acidosis was      treated with IV bicarb and improved with IV fluid resuscitation and      then hemodialysis.  3. Protein, albumin dissociation:  Initially it was felt that Mr.      Rice had a significant dissociation between his total protein and      his albumin:  SPEP and UPEP were ordered, both were negative for      multiple myeloma.  This felt to be secondary to infection.  4. COPD:  [name] Rice had evidence of COPD on chest x-ray:  He was      started on p.r.n. albuterol and Spiriva daily. He had no difficulty      with shortness of breath while in the hospital and does not require      O2 by nasal cannula.  5. Pneumonia:  When [name] Rice came into the hospital, there was a      definite pneumonia seen on chest x-ray in the left base.  He was      started on broad-spectrum antibiotics Zosyn and vancomycin to cover      for possible aspiration and atypical pneumonia.  He was hypotensive      on admission, therefore he was admitted to the ICU for monitoring.      He  did not appear to be in septic shock.  He did not require any      intubation.  He only required oxygen by nasal cannula.  He was      treated with 10 days of Avelox. Interval chest x-ray showed      improved aeration and resolution of infiltrates.  6. Atrial fibrillation:  Records indicate from the past that [name] Rice      has had some evidence of paroxysmal atrial fibrillation.  On      admission, he was in atrial fibrillation with rates 130-140. He was      treated with IV diltiazem and  responded well. He spontaneously      converted to normal sinus rhythm with IV fluid hydration.  He had      no further episodes of A fib after spontaneous conversion.  7. Anemia:  It was noted during his admission that he had a      progressive anemia.  Anemia workup revealed heme positive stools.      Colonoscopy was obtained by Dr. Jeani Hawking and findings included      a colonic ulcer which is typically seen with ischemic colitis.  It      is likely that this was from poor perfusion of the colon secondary      to the initiation of hemodialysis.  This appears to be resolving      his hemoglobin is stable and we expect full resolution of his      symptoms.  He has not had any diarrhea and has had normal bowel      movements.  His abdominal pain has resolved.   DISCHARGE INFORMATION:   DISCHARGE LABORATORY DATA:  Sodium 136, potassium 4.6, chloride 103,  bicarb 29, BUN 15, creatinine 2.3, glucose 81, WBC 6.2, hemoglobin 10.6,  platelets 124, hematocrit 31.7, albumin 2.41. Calcium 8.8, phosphorus  2.2.   DISCHARGE VITAL SIGNS:  T-max 99.2, heart rate 61, respiratory rate 18,  blood pressure 110/69.      Edsel Petrin, D.O.  Electronically Signed     ______________________________  Eliseo Gum, M.D.    Dorena Dew  D:  04/02/2007  T:  04/02/2007  Job:  [id number]

## 2010-08-01 NOTE — Assessment & Plan Note (Signed)
OFFICE VISIT   ISSA, KOSMICKI  DOB:  11/25/1935                                       04/17/2007  HQION#:[id number]   HISTORY:  The patient underwent placement of left brachial cephalic AV  fistula by Dr. Darrick Penna 03/27/2007.  He presents today for postoperative  evaluation.  Antecubital incision healing well.  Fistula is patent with  a palpable thrill.  2+ left radial pulse.   The patient is recovering well.  The fistula appears to be maturing  adequately.  The patient was instructed regarding exercise.  To follow  up p.r.n.   Balinda Quails, M.D.  Electronically Signed   PGH/MEDQ  D:  04/17/2007  T:  04/18/2007  Job:  [id number]

## 2010-08-01 NOTE — Assessment & Plan Note (Signed)
[facility]                       [place] CARDIOLOG[facility] OFFICE NOTE   NAME:[name] Rice, [name] Rice                         MRN:          [id number]  DATE:02/20/2008                            DOB:          10/06/1935    CARDIOLOGIST:  Jonelle Sidle, MD   PRIMARY CARE PHYSICIAN:  He is being established with Dr. Juanetta Gosling.   REASON FOR VISIT:  Two-week followup.   HISTORY OF PRESENT ILLNESS:  [name] Rice is a 75 year old male with  history of nonischemic cardiomyopathy with an EF of 15-20% as well as  cor pulmonale who was recently seen in evaluation on February 02, 2008.  Please refer to that note for complete details.  The patient had a  recent episode of lower extremity cellulitis.  He was evaluated with  cardiac catheterization that did demonstrate 70% mid LAD and 80% ostial  diagonal stenosis.  These were managed medically.  In the office today,  he notes he is doing well.  Since I last saw him, he saw Dr. Juanetta Gosling who  has set him up for PFTs.  He was placed on Spiriva.  He tells me that  his breathing is much better.  He is working with physical therapy  several times a week.  He did have some chest discomfort a week ago.  He  thought this was indigestion.  It began in his room and continued when  he started physical therapy.  Of note, exercise did not make it any  worse.  He was eventually given nitroglycerin x1 with prompt relief.  He  denies any orthopnea or PND.  He denies any syncope.  His pedal edema is  unchanged.   CURRENT MEDICATIONS:  Protonix 40 mg daily, Toprol-XL 25 mg half a  tablet daily, Digoxin 0.25 mg every other day, Aspirin 81 mg daily,  Simvastatin 5 mg nightly, Potassium 12 mEq daily, Lasix 40 mg b.i.d.,  Ipratropium q.6 h., Spiriva daily,  Lisinopril 2.5 mg half a tablet daily, Nitroglycerin p.r.n. chest pain.   PHYSICAL EXAMINATION:  GENERAL:  He is a chronically ill-appearing male  in no acute distress.  VITAL SIGNS:  Blood  pressure is 125/73, pulse 77.  Weight 158 pounds  which is up 3 pounds since his last visit.  HEENT:  Normal.  NECK:  Without JVD at 90 degrees.  CARDIAC:  Normal S1 and S2.  Regular rate and rhythm.  LUNGS:  Bibasilar crackles, otherwise clear.  ABDOMEN:  Soft, nontender.  EXTREMITIES:  Trace 1+ edema bilaterally with chronic stasis changes.   ASSESSMENT AND PLAN:  1. Biventricular failure.  The patient has nonischemic cardiomyopathy      with an ejection fraction of 15-20% and chronic systolic congestive      heart failure as well as right ventricular dysfunction in the      setting of pulmonary hypertension and chronic obstructive pulmonary      disease/cor pulmonale.  From a volume standpoint, he seems to be      stable.  He has gained about 3 pounds since I last saw him.  I will  write orders for the nursing home to weigh him daily and give him      extra Lasix should he gain 3 pounds within a 24-hour period.  When      he was last seen, we initiated a very small dose of lisinopril.  He      had trouble tolerating this when he was at [facility] secondary to      hypotension.  He is tolerating this well, and recent lab work      demonstrates a stable creatinine of 1.36 and his BNP is stable at      482.  His potassium is 4.4.  2. Chest pain.  He did have a 70% mid left anterior descending      stenosis and an 80% ostial second diagonal stenosis at      catheterization that were managed medically.  These may be      contributing to some chest pain.  I am not sure that his chest pain      the other day was cardiac.  We will increase his Toprol to 25 mg a      day both for treatment of his coronary disease as well as for his      systolic heart failure.  We will reassess his symptoms over time.      If he continues to have chest symptoms, we may want to proceed with      a Myoview study to assess for ischemia in the left anterior      descending and diagonal territories.  3.  Chronic obstructive pulmonary disease.  He will follow up with Dr.      Juanetta Gosling as directed.  4. Chronic lower extremity edema.  His edema seems to be stable, and      he is not having any ongoing evidence of cellulitis.  5. Dyslipidemia.  He will need to follow up lipids and LFTs around the      time of his followup appointment.   DISPOSITION:  The patient will be brought back in follow up in the next  4-6 weeks with either myself or Dr. Diona Browner.  Over time, we will need  to reassess his chest pain and decide whether or not he needs Myoview  testing to assess for ischemia.  We will also need to decide when to  proceed with relook echocardiography to reassess his LV function.      Tereso Newcomer, PA-C  Electronically Signed      Jonelle Sidle, MD  Electronically Signed   SW/MedQ  DD: 02/20/2008  DT: 02/21/2008  Job #: [id number]

## 2010-08-01 NOTE — H&P (Signed)
NAMEJAYEL, [name] Rice                ACCOUNT NO.:  [id number]   MEDICAL RECORD NO.:  [id number]          PATIENT TYPE:  INP   LOCATION:  A305                          FACILITY:  [facility]   PHYSICIAN:  Osvaldo Shipper, MD     DATE OF BIRTH:  1935-05-13   DATE OF ADMISSION:  01/11/2008  DATE OF DISCHARGE:  LH                              HISTORY & PHYSICAL   PRIMARY MEDICAL DOCTOR:  [facility] in [place], [place].  He is in the process of changing over his PMD to somebody local.  He  does not know whom he has an appointment with at this time.   ADMISSION DIAGNOSES:  1. Lower extremity cellulitis.  2. Rule out DVT.  3. Severe peripheral edema.  4. Chronic kidney disease.  5. Urinary tract infection.  6. Conjunctivitis.  7. Poor hygiene.   CHIEF COMPLAINT:  Fall.   HISTORY OF PRESENT ILLNESS:  The patient is a 75 year old Caucasian male  who lives by himself who fell at his apartment today and activated his  lifeline.  EMS was sent and they brought him to the ED because of  concerns about his legs.  The patient mentioned that his legs have been  swelling for the past few weeks.  He has been complaining of burning-  type sensation in his right leg.  He denies any fever or chills.  He  denies any chest pain.  He says he has chronic shortness of breath, but  nothing acute.  He mentioned that he fell because his legs gave out.  He  denies any syncopal episodes, denies any head injury.  Does not have a  headache at this time.  He says his right lower extremity has been  draining clear watery type secretions over the past few weeks.  Denies  any nausea, vomiting, abdominal pain.   HOME MEDICATIONS:  1. Lasix 40 mg daily.  2. Other medication, unfortunately he does not know the name of this      pill.   ALLERGIES:  No known drug allergies.   PAST MEDICAL HISTORY:  1. Chronic lower extremity edema.  2. History of chronic kidney disease.  He was in acute renal failure  earlier this year when he had to be dialyzed.  He actually has an      AV fistula in his left upper arm, but he mentioned that he has not      been dialyzed since January 2009.  3. Denies past surgeries.  4. No history of coronary artery disease or stroke.  5. No history of heart disease per se.   SOCIAL HISTORY:  Lives alone in an apartment complex here in [place].  He denies any smoking history.  Currently quit smoking one year ago.  No  alcohol use.  No illicit drug use.  Uses a walker or a cane to ambulate.   FAMILY HISTORY:  Unremarkable.   REVIEW OF SYSTEMS:  GENERAL:  Positive for weakness, malaise.  HEENT:  Unremarkable.  CARDIOVASCULAR:  Unremarkable.  RESPIRATORY:  Unremarkable.  GI:  Unremarkable.  GU:  Unremarkable.  MUSCULOSKELETAL:  Unremarkable.  DERMATOLOGIC:  As in HPI.  NEUROLOGICAL:  Unremarkable.  PSYCHIATRIC:  Unremarkable.  OTHER SYSTEMS:  Unremarkable.   PHYSICAL EXAMINATION:  VITAL SIGNS:  Temperature 97.7, blood pressure  111/88, heart rate 90, respiratory rate 18, saturation 99% on room air.  GENERAL:  Disheveled elderly male in no distress.  HEENT:  There is evidence for conjunctivitis in his left eye.  No pallor  or icterus is present.  Oral mucous membrane is moist.  No oral lesions  noted.  ENT:  Otherwise, unremarkable.  NECK:  Soft and supple.  No thyromegaly is appreciated.  LUNGS:  Clear to auscultation bilaterally.  No wheezes, rales or  rhonchi.  CARDIOVASCULAR:  S1, S2 is normal.  No murmurs appreciated.  No S3, S4.  No rubs.  No bruits.  ABDOMEN:  Soft, nontender, nondistended.  Bowel sounds are present.  No  masses or organomegaly is appreciated.  MUSCULOSKELETAL:  Unremarkable.  NEUROLOGICAL:  He is alert and oriented x3.  No focal neurological  deficits are present.  EXTREMITIES:  He has 2-3+ pitting edema bilaterally.  His right lower  extremity is much more swollen than the left one.  Both his legs are  erythematous, swollen, tender,  slightly warm to touch.  Peripheral  pulses very difficult to palpate because of the swelling.  He has a lot  of weeping of wounds.  There is a shallow ulcer on the left lower  extremity.  Very poor hygiene is noted in his feet.  No definite calf  tenderness is present.  He also has multiple scab wounds all over his  skin, especially over his abdomen.   LABORATORY DATA:  CBC shows a normal white count of 8.5, hemoglobin  12.2, MCV 90, platelet 139, differential is normal.  Electrolytes show  glucose 101, BUN 35, creatinine 1.79.  GFR is 38, bilirubin is 1.4.  LFTs are otherwise normal.  Albumin is 3.1.  BNP is 1160.  Potassium is  normal.  UA shows trace ketones, some proteins, positive nitrites, trace  leukocytes, 11-20 WBCs and many bacteria.  He had a chest x-ray which  was read as showing mild interstitial edema.  He does have prominent  vascular markings and he does have evidence for pleural effusions,  especially on the right side.  The left costophrenic angle is not  clearly visible.  He had an EKG which shows a sinus rhythm with PVCs.  It appeared to be in the normal range, although the QT is borderline.  No Q-waves are present.  There is some nonspecific T-wave changes, but  nothing acute.  ST changes are present.   ASSESSMENT:  This is a 75 year old Caucasian male who was brought in  after he fell at home because EMS was very concerned about his legs.  He  does not have any injuries sustained as a result of the fall, but he  does have evidence for cellulitis of his lower extremities.  He also has  significant edema which extends, actually, all the way up to his  scrotum.  His right lower extremity is larger than the left which could  suggest DVT.  He has a UTI.  He has conjunctivitis.   PLAN:  1. Cellulitis.  Because of the multiple scabs noted all over his body,      MRSA is consideration, and he will be put on vancomycin.  Blood      cultures will be sent.  Wound care will  be provided.  2. Severe pedal edema, Lasix IV will be given.  BNP's will be followed      up.  Echocardiogram will be ordered.  His lower extremity edema      probably multifactorial, but I think mostly because of his kidney      disease.  He did have an echocardiogram in December 2008 which      revealed a normal systolic function of the left ventricle at 55%.      The right ventricle systolic function was thought to be normal.  In      view of this, it is most likely renal in origin.  He also does not      appear to have any liver disease.  3. Unequal lower extremity sizes.  Will get a venous Doppler to rule      out DVT.  In the meantime, I will put him on Lovenox, full dose,      because I think there is a delay of one day before we can get this      Doppler.  Once the Doppler comes back negative, a lower dose of      Lovenox for DVT prophylaxis will be given.  4. Conjunctivitis.  Cipro eye drops will be utilized.  5. UTI Levaquin will be started.  Urine cultures will be sent.  6. We will continue to monitor his renal function.  He does have some      prerenal azotemia which hopefully shall improve with diuresis.  He      has mild thrombocytopenia as well.  He has mild anemia as well.  7. He is a full code.  PTU will be involved as well to ambulate  him.   Further management decisions will depend on his further testing and  patient's response to treatment.      Osvaldo Shipper, MD  Electronically Signed     GK/MEDQ  D:  01/11/2008  T:  01/11/2008  Job:  [id number]

## 2010-08-01 NOTE — Consult Note (Signed)
[name] Rice                ACCOUNT NO.:  [id number]   MEDICAL RECORD NO.:  [id number]          PATIENT TYPE:  INP   LOCATION:  2107                         FACILITY:  [facility]   PHYSICIAN:  [name] Rice, M.D.DATE OF BIRTH:  11/27/1935   DATE OF CONSULTATION:  03/17/2007  DATE OF DISCHARGE:                                 CONSULTATION   REFERRING PHYSICIAN:  Dr. Alison Rice.   REASON FOR CONSULTATION:  Acute renal failure.   HISTORY OF PRESENT ILLNESS:  This is 74 year old white male who  presented to [facility] this morning after a fall  at home.  According to his niece, the patient had a fall 1-2 days ago  but did not injure himself.  The family requested him to go to see a  doctor but he had an appointment tomorrow to see his doctor and was  going to wait until then.  He fell again this morning and thus presented  to the emergency room.  In the emergency room, he was found to have a  low blood pressure.  He has no known history of renal disease though he  does have a history of hypertension and has been on Micardis.  According  to the his niece, he had some cold symptoms about two weeks ago and has  a chronic cough related to his smoking.  He has had poor p.o. intake  over the last few days.  In the [facility] emergency room, his serum  creatinine was 23, BUN greater than 225, potassium 6.6 and thus he was  transferred to [facility].  A Foley catheter was inserted with return of  400 mL of urine.  Chest x-ray showed a left lower lobe infiltrate.   PAST MEDICAL HISTORY:  Significant for hypertension, status post right  hip replacement, history of COPD.   ALLERGIES:  None.   MEDICATIONS:  1. Lasix 80 mg a day.  2. Micardis 80 mg a day.  3. Potassium chloride 20 mEq a day.   SOCIAL HISTORY:  He is a smoker and continues to smoke several packs a  day for multiple years, no alcohol.  He lives with his brother.  He is a  widower.   FAMILY  HISTORY:  Negative for renal disease. He does have a brother who  is receiving cancer chemotherapy at this time.   REVIEW OF SYSTEMS:  Appetite has been poor, energy level poor.  No  shortness of breath, PND, orthopnea.  No chest pains or chest pressures.  No recent change in bowel habits.  No new arthritic complaints.  He did  have a rash on his left lower extremity over the last few months for  which he saw a dermatologist which was treated and it has resolved but  has left behind some scarring. He denies any dysuria. The rest of the  review of systems are unremarkable.   PHYSICAL EXAMINATION:  Blood pressure 103/46, temperature 94.8, pulse  116.  GENERAL:  A  75 year old white male looks older than his stated age in  no acute distress.  HEENT:  Sclera nonicteric. Extraocular muscles are intact.  NECK:  Reveals flat neck veins, skin turgor is decreased.  LUNGS:  Decreased breath sounds throughout, left greater than right.  Bibasilar crackles left greater than right.  HEART:  Irregular regular without murmur, rub or gallop.  ABDOMEN:  Positive bowel sounds, nontender, nondistended.  No  hepatosplenomegaly. There is a Foley catheter in place.  EXTREMITIES:  No edema.  He has some chronic skin changes of his left lower  extremities from healing of his recent rash. His pulses are nonpalpable  on the dorsalis pedis and posterior tibial.  NEUROLOGIC:  He is awake and alert.  Cranial nerves are intact.  Positive asterixis.   LABORATORY:  Sodium 154, potassium 6.6, bicarb 14, BUN 225, creatinine  23, albumin 2.9, calcium 10.1, white count 21,000, hemoglobin 13.2,  platelet count 216,000.   IMPRESSION:  1. Renal failure presumably acute. The question is whether or not      volume depletion in the face of an ARB is the sole cause or if      there is another cause for his renal failure and his poor p.o.      intake was secondary to uremia.  His calcium is slightly high as is      total  protein. Whether this represents underlying myeloma or just      volume depletion is unclear at this time.  Will send off serologic      tests as well as a serum protein electrophoresis.  How his renal      function responds to volume repletion will determine how much      further we need to go in terms of renal workup.  2. Hyperkalemia.  3. Atrial fib.  4. Chronic obstructive pulmonary disease.   PLAN:  1. Kayexalate p.o. tonight and followup of serum potassium.  2. IV hydration with 1/4 normal saline and 2 amps of bicarb at 200 mLs      an hour.  3. I would consider IV digoxin for rate control as this will not      effect his blood pressure. Once he is appropriately hydrated, then      beta blocker or Cardizem could be used.  4. Will check a urinalysis, urine sodium, urine creatinine.  5. Check SPEP.  6. Check renal ultrasound.  7. Check serologies to rule out pulmonary renal syndrome given the      left lower lobe infiltrate.  8. If renal function is not improved with hydration then will      certainly need hemodialysis. I discussed this with the patient and      his niece.  9. Repeat labs in the morning.   Thank you very much for the consult, I will follow the patient with you.           ______________________________  [name] Rice, M.D.     MTM/MEDQ  D:  03/17/2007  T:  03/18/2007  Job:  [id number]

## 2010-08-01 NOTE — Procedures (Signed)
[name] Rice, [name] Rice                ACCOUNT NO.:  [id number]   MEDICAL RECORD NO.:  [id number]          PATIENT TYPE:  OUT   LOCATION:  RESP                          FACILITY:  [facility]   PHYSICIAN:  Edward L. Juanetta Gosling, M.D.DATE OF BIRTH:  09-05-1935   DATE OF PROCEDURE:  DATE OF DISCHARGE:                            PULMONARY FUNCTION TEST   1. Spirometry shows a moderate ventilatory defect with evidence of      airflow obstruction at the level of the smaller airways.  2. Lung volumes are normal.  3. DLCO is normal.  4. Arterial blood gases are normal.  5. There is significant improvement with inhaled bronchodilator.      Edward L. Juanetta Gosling, M.D.  Electronically Signed     ELH/MEDQ  D:  03/15/2008  T:  03/16/2008  Job:  [id number]   cc:   Tesfaye D. Felecia Shelling, MD  Fax: [phone]

## 2010-08-01 NOTE — Group Therapy Note (Signed)
[name] Rice, [name] Rice                ACCOUNT NO.:  [id number]   MEDICAL RECORD NO.:  [id number]          PATIENT TYPE:  INP   LOCATION:  A305                          FACILITY:  [facility]   PHYSICIAN:  Osvaldo Shipper, MD     DATE OF BIRTH:  1935/06/06   DATE OF PROCEDURE:  01/12/2008  DATE OF DISCHARGE:                                 PROGRESS NOTE   SUBJECTIVE:  The patient is feeling better.  He is denying any chest  pain or shortness of breath.  He is tolerating p.o. intake quite well.  Passing lot of urine.  So overall, he seems to be doing quite okay  according to the patient.   OBJECTIVE:  VITAL SIGN:  He has been afebrile.  Heart rate in the early  100s, irregular.  Respiratory rate is 20.  Blood pressure is 123/69,  saturation 93% on 2 liters by nasal cannula.  GENERAL:  He is an elderly white male in no distress.  HEENT:  There is no pallor.  No icterus.  Oral mucous membrane is moist.  No oral lesions are noted.  NECK:  Soft and supple.  No thyromegaly is appreciated.  LUNGS:  A few crackles at the bases, which disappeared when  he took  some deep breaths.  No wheezing is present.  CARDIOVASCULAR:  S1 and S2 is normal and regular.  No murmur is  appreciated.  No S3 or S4.  No rubs.  No bruits.  Significant 2+ pitting  edema is present in bilateral lower extremities.  Today, the extremities  are covered with bandage.  ABDOMEN:  Soft, nontender, and nondistended.  Bowel sounds are present.  No masses or organomegaly is appreciated.  NEUROLOGIC:  He is alert and oriented x3.  No focal neurological  deficits are present.   LABORATORY DATA:  Labs are all pending this morning.  He did have  cardiac panel checked and his troponin was 0.08 with normal CK.  His  lipid profile did not show any significant abnormalities.  TSH was 4.14.  Urine cultures and blood cultures are pending at this time.   ASSESSMENT AND PLAN:  1. Bilateral lower extremity cellulitis.  He is on vancomycin and      Levaquin, which should be continued for now.  It is too early for      any significant changes in his examination.  He remains afebrile.      His white count was normal on admission.  2. Severe lower extremity edema.  He is on IV Lasix.  The reason for      the edema, which extends all the way up to his genitourinary system      including his scrotum is multifactorial, but mostly because of his      kidney disease.  He is on IV Lasix.  He is diuresing well.      Although, the in's and out's are not being charted accurately.      Once again, not much changes to his lower extremity at this time.      We are awaiting results  from his labs this morning.  3. Urinary tract infection.  He is on Levaquin.  Urine cultures are      pending.  4. Conjunctivitis.  He is on Cipro eye drops, which are also ongoing      at this time.  5. Physical therapy evaluation will also be done hopefully today so      that we can mobilize this patient a little bit more and see how      safe he will be to live by himself.  6. Chronic kidney disease.  We will await his renal function from this      morning labs, so that we can adjust his medications.   Other studies that were done include a venous Doppler of the lower  extremities, which are negative for DVT.   So, basically we await labs from this morning.  The patient is stable.  We also await echocardiogram.  So, it would probably be at least a few  days before this patient will be able to go back home.      Osvaldo Shipper, MD  Electronically Signed     GK/MEDQ  D:  01/12/2008  T:  01/12/2008  Job:  [id number]

## 2010-08-01 NOTE — Assessment & Plan Note (Signed)
[facility]                       [place] CARDIOLOGY OFFICE NOTE   NAME:CREWSBurk, Hoctor                         MRN:          [id number]  DATE:06/09/2008                            DOB:          12/28/1935    CARDIOLOGIST:  Jonelle Sidle, MD   PRIMARY CARE PHYSICIAN:  Catalina Pizza, MD   REASON FOR VISIT:  Two-week followup.   HISTORY OF PRESENT ILLNESS:  Mr. Baswell is a 75 year old male with  nonischemic cardiomyopathy with an EF of 15-20% and biventricular  failure with severe COPD and pulmonary hypertension who saw Dr. Diona Browner  on May 26, 2008, for routine followup.  He had stopped his medications  recently, and he had some of his medicines added back.  His Toprol was  added back at 25 mg one-half tablet daily and lisinopril 2.5 mg one-half  tablet daily and potassium 20 mEq daily.  Followup blood work on June 03, 2008, demonstrated potassium of 4.7, creatinine 0.63, and a BNP of  257.5.  He returns for followup today.  He is doing well.  He continues  to note chronic dyspnea with exertion without significant change.  He  describes NYHA class III symptoms.  He denies orthopnea, PND, or  significant change in his pedal edema.  He denies chest pain.  He denies  syncope.   CURRENT MEDICATIONS:  1. Toprol-XL 25 mg half a tablet daily.  2. Aspirin 81 mg daily.  3. Potassium 20 mEq daily.  4. Lasix 40 mg b.i.d.  5. Spiriva daily.  6. Lisinopril 2.5 mg half a tablet daily.  7. Nitroglycerin p.r.n. chest pain.   PHYSICAL EXAMINATION:  GENERAL:  He is a chronically ill-appearing male  in no acute stress.  He does have pursed-lip breathing which is chronic  for him.  VITAL SIGNS:  Blood pressure 171/80, repeat blood pressure by me by  manual cuff in the right arm 152/80, pulse manually counted by me at 56,  weight 160 pounds which is stable from his last visit.  HEENT:  Normal.  NECK:  Without JVD.  CARDIAC:  Normal S1 and S2.  Regular rate  and rhythm.  A 1-2/6 systolic  ejection murmur heard best along the left sternal border.  LUNGS:  Decreased breath sounds bilaterally.  No significant wheezing.  No rales.  ABDOMEN:  Soft, nontender.  EXTREMITIES:  Edema 1-2+ bilaterally.  NEUROLOGIC:  He is alert and oriented x3.  Cranial nerves II through XII  are grossly intact.   ASSESSMENT AND PLAN:  1. Nonischemic cardiomyopathy with an ejection fraction of 15-20% with      biventricular failure, pulmonary hypertension, severe tricuspid      regurgitation, and moderate mitral regurgitation.  The patient is      stable.  He seems to be optivolemic.  He is tolerating his      medications well, and his recent lab work demonstrated stable renal      function and potassium.  His blood pressure can certainly tolerate      up titration of his medications.  I have decided to go ahead  and      increase his lisinopril to 2.5 mg daily.  His heart rate is in the      50s, and he does have a history of asymptomatic sinus bradycardia.      I will defer adding digoxin back to his medical regimen at this      time.  He apparently had significant hypotension in the hospital      with carvedilol.  If his pressures remain elevated with up      titration in his ACE inhibitor, he may be able to tolerate a very      low dose of carvedilol instead of Toprol which may provide more      benefit.  I will set him up for repeat blood work in 1 week with a      BMET.  2. Coronary artery disease with 70% mid left anterior descending and      80% ostial diagonal 2 stenosis in October 2009 by cardiac      catheterization.  He will remain on aspirin.  He is not having any      symptoms of angina at this time.  3. Dyslipidemia.  He was previously on low-dose simvastatin.  He is no      longer on this for unclear reasons.  With his history of coronary      plaque, he would benefit from statin therapy.  I have placed him      back on simvastatin 5 mg nightly.  He  should remain at this low      dose given his chronic kidney disease.  4. Chronic kidney disease.  His creatinine is fairly stable.  As noted      above, we will recheck blood work in a week.  I have decreased his      potassium to 10 mEq a day given the increase in his ACE inhibitor.   DISPOSITION:  The patient will follow up with either me or Dr. Diona Browner  in 1 month or sooner p.r.n.      Tereso Newcomer, PA-C  Electronically Signed      Gerrit Friends. Dietrich Pates, MD, [facility]  Electronically Signed   SW/MedQ  DD: 06/09/2008  DT: 06/10/2008  Job #: [id number]

## 2010-08-01 NOTE — Discharge Summary (Signed)
NAMEKONNER, SAIZ                ACCOUNT NO.:  [id number]   MEDICAL RECORD NO.:  [id number]          PATIENT TYPE:  INP   LOCATION:  A305                          FACILITY:  [facility]   PHYSICIAN:  Osvaldo Shipper, MD     DATE OF BIRTH:  05-01-1935   DATE OF ADMISSION:  01/11/2008  DATE OF DISCHARGE:  10/27/2009LH                               DISCHARGE SUMMARY   Please note, the patient was transferred to [facility].   DIAGNOSES AT THE TIME OF TRANSFER:  1. Pedal edema with severe cardiomyopathy, likely ischemic.  2. History of noncompliance.  3. Lower extremity cellulitis.  4. Conjunctivitis.  5. Urinary tract infection.  6. Chronic kidney disease.   Please review my H&P as well as my progress notes for the last couple of  days for details regarding this patient's presenting illness.   BRIEF HOSPITAL COURSE:  Briefly, this is a 75 year old Caucasian male  who fell at home and when EMS called he was found to have severe edema  and possibility of cellulitis in his lower extremities, so he was  brought into the hospital.  He did have 2-3+ pitting edema bilaterally  and he did have some evidence of cellulitis, but mostly chronic changes.   The patient was started on antibiotics.  Venous Dopplers were negative  for DVT.  We started him on Lasix as well.  He was also found to have  conjunctivitis, which was also treated with Cipro Otic.  The patient's  chest x-ray did not show any overt CHF.  He was found to have  cardiomegaly.  He did have bilateral pleural effusions.  Also, the  patient's BNP was also elevated at greater than 1000.  The patient was  not diuresing much even on high-dose IV Lasix.  We obtained  echocardiogram, which showed EF about 15%.  His troponins were mildly  elevated.  In view of all of the above, Cardiology was consulted, and  they thought that the patient needed further tuning up, and so they felt  he would be benefited by going to [facility], so he was  transferred  there.  The patient was started on a milrinone drip.  He does have  chronic kidney disease, which probably prevents adequate diuresis.  He  is a patient who has had dialysis in the past, but the dialysis was not  continued because the patient had improved.  He does have a fistula in  his left upper extremity.   The patient was seen by Dr. Diona Browner from [facility] and was  transferred to [facility].   Discharge instructions, medications etc to be listed when patient is  discharged from [facility].      Osvaldo Shipper, MD  Electronically Signed     GK/MEDQ  D:  01/13/2008  T:  01/14/2008  Job:  [id number]

## 2010-08-01 NOTE — Group Therapy Note (Signed)
NAMEREI, MEDLEN                ACCOUNT NO.:  [id number]   MEDICAL RECORD NO.:  [id number]          PATIENT TYPE:  INP   LOCATION:  A305                          FACILITY:  [facility]   PHYSICIAN:  Osvaldo Shipper, MD     DATE OF BIRTH:  [birth year]   DATE OF PROCEDURE:  01/13/2008  DATE OF DISCHARGE:                                 PROGRESS NOTE   SUBJECTIVE:  The patient is feeling fine.  He does not have any  complaints to offer.  Denies any chest pain.  Has no abdominal pain, no  nausea or vomiting.   OBJECTIVE FINDINGS:  VITAL SIGNS:  He has remained afebrile, heart rate  in the 80s to 90s, regular, respiratory rate is about 20, blood pressure  is 120/74, saturation 97% on 3 liters by nasal cannula.  In's and out's  - he was negative by approximately 650 cc yesterday, though the charting  does not seem to be accurate.  He actually appears to have gained some  weight while he is in here.  GENERAL:  An elderly white male in no distress.  HEENT:  There is no pallor, no icterus.  Oral mucous membranes are  moist.  No oral lesions are noted.  NECK:  Soft and supple.  No thyromegaly is appreciated.  LUNGS:  Clear to auscultation bilaterally.  No wheezing, rales or  rhonchi.  CARDIOVASCULAR:  S1 and S2 normal, regular.  No murmurs appreciated.  No  S3, no S4, no rubs, no bruits.  He does have significant 2+ pitting  edema bilaterally.  ABDOMEN:  Soft, nontender, nondistended.  Bowel sounds are present.  No  masses or organomegaly appreciated.  EXTREMITIES:  Both his lower extremities are swollen up and they are  draining clear secretions.  The skin is also erythematous and slightly  warm.  Most of the changes appear to be quite chronic.  NEUROLOGICALLY:  He is alert, oriented.  No focal neurological deficits  are present.   LABORATORY DATA:  Pending from this morning except for the troponin  which is 0.3.  Troponin was as high as 0.65 yesterday with normal CK and  CK-MB.  His B-12  level was 246, folate 16, TSH 4.14.  His lipid profile  did not show any hypercholesterolemia.   Labs from this morning are still pending.   ASSESSMENT AND PLAN:  1. Bilateral lower extremity cellulitis.  He is on vancomycin and      Levaquin which is to be continued.  He has been afebrile.  2. Severe lower extremity edema.  He is on IV Lasix.  We have to      increase the dose of Lasix, depending on his blood work.  He is      diuresing, and I think there is an issue with charting, as I'm not      getting an accurate assessment of how much he is diuresing.  His      edema seems to be improved today.  3. Elevated troponins with cardiomyopathy.  The patient likely has      coronary artery  disease.  It is very concerning that his EF has      dropped from 50% in December of 2008 to 15-20% in October of 2009.      The patient likely will require further cardiac testing.  [facility] will be consulted to see this patient.  He also had      evidence for elevated PA systolic pressure.  Severe tricuspid      regurgitation was also noted.  He is on aspirin.  Consideration to      starting beta-blockers to be given.  He will also be a candidate      for ACE inhibitors, although his renal function may limit that.  I      will defer all this to the cardiologist.  4. Conjunctivitis.  He is on Cipro eye drops.  5. Urinary tract infection.  He is on antibiotics.  Urine cultures are      pending.  Blood cultures are negative.  6. Chronic kidney disease. Probably contributing to his edema as well.  7. PT evaluation ongoing.   We will await cardiology input, continue diuresis and await improvement  of the patient's clinical status.  The patient did get wound care for  his lower extremities.      Osvaldo Shipper, MD  Electronically Signed     GK/MEDQ  D:  01/13/2008  T:  01/13/2008  Job:  [id number]

## 2010-08-01 NOTE — Assessment & Plan Note (Signed)
[facility]                       [facility] OFFICE NOTE   NAME:Rice, Rice                         MRN:          [id number]  DATE:05/26/2008                            DOB:          August 27, 1935    PRIMARY CARE PHYSICIAN:  Catalina Pizza, MD   REASON FOR VISIT:  Scheduled followup.   HISTORY OF PRESENT ILLNESS:  Rice Rice was last seen in the office back  in December 2009.  He has a nonischemic cardiomyopathy with a left  ventricular ejection fraction of 15-20%, nonobstructive coronary  disease, hypertension, renal insufficiency, chronic obstructive  pulmonary disease with pulmonary hypertension as well as right  ventricular dysfunction, and moderate mitral regurgitation.  He has run  out of most of his medications and is taking only aspirin, Lasix, and  Spiriva for sometime now.  Symptomatically, he reports stable shortness  of breath, typically at NYHA class III.  He is not actively wheezing,  but does have some pursed lip breathing.  He denies any chest pain and  reports fairly good control of his chronic lower extremity edema and  venous stasis.  He is here with his cousin and has been looking in on  him at home.  Today, we talked about importance of his medications and  reinitiation of some therapy with followup blood work.  As of November  2009, he had a sodium of 141, potassium 4.4, BUN 39, and creatinine of  1.3.  BNP was 482 at that time.   PRESENT MEDICATIONS:  1. Aspirin 81 mg p.o. daily.  2. Lasix 40 mg p.o. b.i.d.  3. Spiriva 18 mcg daily.   He is on none of the other medicines listed in his prior note.   REVIEW OF SYSTEMS:  As outlined above.  No palpitations or syncope.  No  bleeding problems.  Otherwise negative.   PHYSICAL EXAMINATION:  VITAL SIGNS:  Blood pressure today is 142/93,  heart rate is in the 80s with ectopic beats, weight is 160 pounds, which  is up only 2 pounds from his visit back in December.  GENERAL:   He is a chronically ill-appearing disheveled male in no acute  distress.  HEENT:  Conjunctivae was normal.  Oropharynx clear with poor dentition.  NECK:  Supple.  Elevated jugular venous pressure noted at 12 cm of  water.  No thyromegaly.  LUNGS:  Diminished breath sounds, but no rales.  CARDIAC:  Regular rate and rhythm with occasional ectopic beat.  No S3,  2/6 apical systolic murmur.  No pericardial rub.  ABDOMEN:  Protuberant  and nontender.  Unable to palpate liver edge.  Normal active bowel  sounds.  EXTREMITIES:  Chronic edema 2+ with venous stasis and diminished distal  pulses.  SKIN:  Otherwise, warm and dry.  MUSCULOSKELETAL:  No kyphosis noted.  NEUROPSYCHIATRIC:  The patient is alert and oriented x3.   IMPRESSION AND RECOMMENDATIONS:  Nonischemic cardiomyopathy with a left  ventricular ejection fraction of 15-20% associated with right  ventricular dysfunction, chronic obstructive pulmonary disease, and  pulmonary hypertension as well as moderate mitral regurgitation.  Main  goal  at this time is reinitiation of basic medical therapy and close  office followup.  Symptomatically, Rice Rice has NYHA class III dyspnea  on exertion, although his weight has been relatively stable over the  last 3 months.  He does have chronic lower extremity edema that has not  markedly worsened.  I plan to reinitiate Toprol-XL 25 mg one-half tablet  daily, K-Dur 20 mEq daily, and lisinopril 2.5 mg one-half tablet daily.  This is when he was taking as of his last visit.  We will obtain a  followup BMET and BNP in 1 weeks' time and have him follow up clinically  in 2 weeks.  Further plans to follow.     Jonelle Sidle, MD  Electronically Signed    SGM/MedQ  DD: 05/26/2008  DT: 05/26/2008  Job #: [id number]   cc:   Catalina Pizza, M.D.

## 2010-08-04 NOTE — Consult Note (Signed)
[name] Rice, [name] Rice                ACCOUNT NO.:  [id number]   MEDICAL RECORD NO.:  [id number]          PATIENT TYPE:  INP   LOCATION:  6708                         FACILITY:  [facility]   PHYSICIAN:  [name] Hawks. Elnoria Howard, MD    DATE OF BIRTH:  [birth year]   DATE OF CONSULTATION:  03/25/2006  DATE OF DISCHARGE:                                 CONSULTATION   REASON FOR CONSULTATION:  Heme positive stool and anemia.   REFERRING PHYSICIAN:  Teaching service.   HISTORY OF PRESENT ILLNESS:  This is a 75 year old gentleman with a past  medical history of hypertension, status post right hip replacement and  COPD who initially presented to [facility] after having  a fall.  During his evaluation in the [facility] emergency room, he was  noted to have a creatinine of 23 and a BUN greater than 225 with  potassium of 6.6.  He was subsequently transferred to [facility] for further evaluation and treatment.  Apparently, the patient  had been on an ARB and most likely with the combination of his  hypertension resulted in his renal failure.  The patient during the  evaluation was noted to be anemic and with heme-positive stools.  Subsequently, GI consultation was requested for further evaluation and  treatment.  The patient denies having any history of hematochezia or  melena.  No prior colonoscopy.  No known history of colon cancer.   PAST MEDICAL AND SURGICAL HISTORY:  As stated above.   FAMILY HISTORY:  Noncontributory.   SOCIAL HISTORY:  The patient is positive for tobacco.  No alcohol or  illicit drug use.   ALLERGIES:  NO KNOWN DRUG ALLERGIES.   MEDICATIONS:  1. PhosLo.  2. Cephazolin  3. Darbepoietin.  4. Heparin.  5. Avelox.  6. Protonix.  7. Xopenex.   REVIEW OF SYSTEMS:  Negative per the 11 point review of systems.   PHYSICAL EXAMINATION:  VITAL SIGNS:  Blood pressure is 103/59, heart  rate is 57, respirations 18, temperature is 97.5.  GENERAL:  The patient is  in no acute distress, alert and oriented.  HEENT:  Normocephalic, atraumatic.  Extraocular muscles intact.  NECK:  Supple.  No lymphadenopathy.  LUNGS:  Clear to auscultation bilaterally.  CARDIOVASCULAR:  Regular rate and rhythm.  ABDOMEN:  Flat, soft, nontender, nondistended.  EXTREMITIES:  No clubbing, cyanosis or edema.   LABORATORY VALUES:  White blood cell count 7.9, hemoglobin 10.3, MCV is  92.4, platelets of 177.  Sodium 139, potassium 4.3, chloride 103, CO2 of  30, glucose 91, BUN 17, creatinine is 3.3.   IMPRESSION:  1. Heme-positive stool.  2. Acute renal failure.  At this time, further evaluation will be      performed with a colonoscopy.  He is not iron deficient; therefore,      no EGD is required, and he is not having any upper GI symptoms.   PLAN:  Plan is for colonoscopy on March 28, 2007.  Further  recommendations pending findings.      [name] Hawks Elnoria Howard, MD  Electronically Signed  PDH/MEDQ  D:  03/27/2007  T:  03/27/2007  Job:  [id number]   cc:   Teaching Service

## 2010-11-02 ENCOUNTER — Other Ambulatory Visit: Payer: Self-pay | Admitting: *Deleted

## 2010-11-02 MED ORDER — SIMVASTATIN 5 MG PO TABS: 5.0000 mg | ORAL_TABLET | Freq: Every day | ORAL | Status: DC

## 2010-11-22 ENCOUNTER — Other Ambulatory Visit: Payer: Self-pay | Admitting: *Deleted

## 2010-11-22 MED ORDER — LISINOPRIL 5 MG PO TABS: 5.0000 mg | ORAL_TABLET | Freq: Every day | ORAL | Status: DC

## 2010-12-07 LAB — RENAL FUNCTION PANEL
Albumin: 1.7 — ABNORMAL LOW
Albumin: 1.7 — ABNORMAL LOW
Albumin: 1.8 — ABNORMAL LOW
Albumin: 2.4 — ABNORMAL LOW
Albumin: 2.4 — ABNORMAL LOW
BUN: 17
BUN: 21
BUN: 29 — ABNORMAL HIGH
BUN: 98 — ABNORMAL HIGH
CO2: 27
CO2: 27
CO2: 28
CO2: 29
CO2: 29
CO2: 30
CO2: 31
Calcium: 7.3 — ABNORMAL LOW
Calcium: 7.9 — ABNORMAL LOW
Calcium: 8.4
Calcium: 8.5
Calcium: 8.6
Calcium: 9
Chloride: 100
Chloride: 103
Chloride: 103
Chloride: 105
Chloride: 105
Chloride: 105
Chloride: 108
Creatinine, Ser: 2.33 — ABNORMAL HIGH
Creatinine, Ser: 3.33 — ABNORMAL HIGH
Creatinine, Ser: 5.05 — ABNORMAL HIGH
Creatinine, Ser: 6.39 — ABNORMAL HIGH
GFR calc Af Amer: 11 — ABNORMAL LOW
GFR calc Af Amer: 14 — ABNORMAL LOW
GFR calc Af Amer: 16 — ABNORMAL LOW
GFR calc Af Amer: 18 — ABNORMAL LOW
GFR calc Af Amer: 19 — ABNORMAL LOW
GFR calc Af Amer: 34 — ABNORMAL LOW
GFR calc Af Amer: 4 — ABNORMAL LOW
GFR calc Af Amer: 5 — ABNORMAL LOW
GFR calc non Af Amer: 13 — ABNORMAL LOW
GFR calc non Af Amer: 15 — ABNORMAL LOW
GFR calc non Af Amer: 16 — ABNORMAL LOW
GFR calc non Af Amer: 28 — ABNORMAL LOW
GFR calc non Af Amer: 3 — ABNORMAL LOW
GFR calc non Af Amer: 4 — ABNORMAL LOW
GFR calc non Af Amer: 9 — ABNORMAL LOW
GFR calc non Af Amer: 9 — ABNORMAL LOW
Glucose, Bld: 126 — ABNORMAL HIGH
Glucose, Bld: 83
Glucose, Bld: 87
Glucose, Bld: 88
Glucose, Bld: 89
Glucose, Bld: 91
Glucose, Bld: 98
Phosphorus: 3.5
Phosphorus: 3.8
Phosphorus: 3.8
Phosphorus: 3.9
Phosphorus: 3.9
Phosphorus: 7.9 — ABNORMAL HIGH
Potassium: 3.4 — ABNORMAL LOW
Potassium: 3.7
Potassium: 3.9
Potassium: 4.1
Potassium: 4.5
Potassium: 4.6
Potassium: 4.6
Sodium: 137
Sodium: 138
Sodium: 139
Sodium: 139
Sodium: 139
Sodium: 140
Sodium: 141
Sodium: 143
Sodium: 144
Sodium: 144
Sodium: 144

## 2010-12-07 LAB — CBC
HCT: 23.4 — ABNORMAL LOW
HCT: 23.9 — ABNORMAL LOW
HCT: 25.3 — ABNORMAL LOW
HCT: 30.3 — ABNORMAL LOW
HCT: 30.9 — ABNORMAL LOW
HCT: 31.7 — ABNORMAL LOW
Hemoglobin: 10.1 — ABNORMAL LOW
Hemoglobin: 10.3 — ABNORMAL LOW
Hemoglobin: 10.7 — ABNORMAL LOW
Hemoglobin: 7.6 — CL
Hemoglobin: 7.9 — CL
Hemoglobin: 8.1 — ABNORMAL LOW
Hemoglobin: 8.1 — ABNORMAL LOW
Hemoglobin: 8.3 — ABNORMAL LOW
Hemoglobin: 8.5 — ABNORMAL LOW
MCHC: 32.8
MCHC: 33.4
MCHC: 33.5
MCHC: 33.8
MCHC: 33.9
MCHC: 33.9
MCHC: 34
MCHC: 34.2
MCHC: 34.7
MCV: 92.2
MCV: 92.4
MCV: 92.6
MCV: 92.8
MCV: 92.8
MCV: 92.9
MCV: 93
MCV: 93.4
MCV: 94.1
Platelets: 124 — ABNORMAL LOW
Platelets: 139 — ABNORMAL LOW
Platelets: 162
Platelets: 168
Platelets: 195
Platelets: 258
RBC: 2.41 — ABNORMAL LOW
RBC: 2.47 — ABNORMAL LOW
RBC: 2.56 — ABNORMAL LOW
RBC: 2.58 — ABNORMAL LOW
RBC: 2.71 — ABNORMAL LOW
RBC: 3.06 — ABNORMAL LOW
RBC: 3.2 — ABNORMAL LOW
RBC: 3.34 — ABNORMAL LOW
RDW: 13.9
RDW: 14.7
RDW: 14.8
RDW: 15
RDW: 15.1
WBC: 10.3
WBC: 10.7 — ABNORMAL HIGH
WBC: 11.4 — ABNORMAL HIGH
WBC: 11.9 — ABNORMAL HIGH
WBC: 13.9 — ABNORMAL HIGH
WBC: 6.2
WBC: 7.7
WBC: 7.8
WBC: 8.8
WBC: 9.3

## 2010-12-07 LAB — CROSSMATCH

## 2010-12-07 LAB — DIFFERENTIAL
Basophils Relative: 1
Eosinophils Absolute: 0.1
Eosinophils Relative: 1
Lymphs Abs: 2.1
Monocytes Relative: 11
Neutrophils Relative %: 63

## 2010-12-07 LAB — PROTIME-INR
INR: 1.3
Prothrombin Time: 16.2 — ABNORMAL HIGH

## 2010-12-07 LAB — BASIC METABOLIC PANEL
BUN: 16
BUN: 185 — ABNORMAL HIGH
Calcium: 7.2 — ABNORMAL LOW
Calcium: 7.2 — ABNORMAL LOW
Calcium: 8.7
Chloride: 100
Chloride: 105
Creatinine, Ser: 13.85 — ABNORMAL HIGH
Creatinine, Ser: 14.14 — ABNORMAL HIGH
GFR calc Af Amer: 19 — ABNORMAL LOW
GFR calc Af Amer: 23 — ABNORMAL LOW
GFR calc Af Amer: 4 — ABNORMAL LOW
GFR calc non Af Amer: 16 — ABNORMAL LOW
GFR calc non Af Amer: 4 — ABNORMAL LOW
Glucose, Bld: 116 — ABNORMAL HIGH
Potassium: 4.2
Potassium: 4.5
Sodium: 139
Sodium: 140

## 2010-12-07 LAB — PHOSPHORUS: Phosphorus: 3.5

## 2010-12-07 LAB — PTH, INTACT AND CALCIUM
Calcium, Total (PTH): 7.2 — ABNORMAL LOW
PTH: 524.9 — ABNORMAL HIGH

## 2010-12-07 LAB — HAPTOGLOBIN: Haptoglobin: 370 — ABNORMAL HIGH

## 2010-12-07 LAB — CULTURE, BLOOD (ROUTINE X 2)
Culture: NO GROWTH
Culture: NO GROWTH

## 2010-12-07 LAB — RETICULOCYTES: RBC.: 2.82 — ABNORMAL LOW

## 2010-12-07 LAB — MAGNESIUM: Magnesium: 2.1

## 2010-12-07 LAB — CARDIAC PANEL(CRET KIN+CKTOT+MB+TROPI)
CK, MB: 1.4
Relative Index: INVALID
Troponin I: 0.03

## 2010-12-07 LAB — HEPATITIS B SURFACE ANTIGEN: Hepatitis B Surface Ag: NEGATIVE

## 2010-12-07 LAB — PTH-RELATED PEPTIDE: PTH-related peptide: <0.2 (ref <1.4)

## 2010-12-07 LAB — CLOSTRIDIUM DIFFICILE EIA: C difficile Toxins A+B, EIA: NEGATIVE

## 2010-12-15 LAB — DIFFERENTIAL
Basophils Absolute: 0
Basophils Relative: 0
Lymphocytes Relative: 15
Neutro Abs: 7.6
Neutrophils Relative %: 71

## 2010-12-15 LAB — BASIC METABOLIC PANEL
CO2: 28
Calcium: 8.5
Creatinine, Ser: 1.76 — ABNORMAL HIGH
GFR calc Af Amer: 47 — ABNORMAL LOW
GFR calc non Af Amer: 38 — ABNORMAL LOW
Glucose, Bld: 94
Sodium: 148 — ABNORMAL HIGH

## 2010-12-15 LAB — URINE MICROSCOPIC-ADD ON

## 2010-12-15 LAB — RAPID URINE DRUG SCREEN, HOSP PERFORMED
Amphetamines: NOT DETECTED
Cocaine: NOT DETECTED
Opiates: NOT DETECTED
Tetrahydrocannabinol: NOT DETECTED

## 2010-12-15 LAB — URINALYSIS, ROUTINE W REFLEX MICROSCOPIC
Nitrite: POSITIVE — AB
Specific Gravity, Urine: >1.03 — ABNORMAL HIGH
Urobilinogen, UA: 4 — ABNORMAL HIGH

## 2010-12-15 LAB — ACETAMINOPHEN LEVEL: Acetaminophen (Tylenol), Serum: <10 — ABNORMAL LOW

## 2010-12-15 LAB — SALICYLATE LEVEL: Salicylate Lvl: <4

## 2010-12-15 LAB — CBC
MCHC: 32.6
RDW: 18.3 — ABNORMAL HIGH

## 2010-12-18 LAB — COMPREHENSIVE METABOLIC PANEL
Alkaline Phosphatase: 92
BUN: 39 — ABNORMAL HIGH
Creatinine, Ser: 1.72 — ABNORMAL HIGH
Glucose, Bld: 115 — ABNORMAL HIGH
Potassium: 3.5
Total Protein: 5.6 — ABNORMAL LOW

## 2010-12-18 LAB — BASIC METABOLIC PANEL
BUN: 39 — ABNORMAL HIGH
CO2: 26
Calcium: 8.7
Calcium: 8.7
Calcium: 8.8
Creatinine, Ser: 1.54 — ABNORMAL HIGH
GFR calc Af Amer: 49 — ABNORMAL LOW
GFR calc Af Amer: 54 — ABNORMAL LOW
GFR calc non Af Amer: 41 — ABNORMAL LOW
GFR calc non Af Amer: 44 — ABNORMAL LOW
Glucose, Bld: 103 — ABNORMAL HIGH
Glucose, Bld: 142 — ABNORMAL HIGH
Glucose, Bld: 91
Sodium: 132 — ABNORMAL LOW
Sodium: 132 — ABNORMAL LOW

## 2010-12-18 LAB — IRON AND TIBC
Iron: 59
Saturation Ratios: 22
TIBC: 274

## 2010-12-18 LAB — CBC
HCT: 33.2 — ABNORMAL LOW
Hemoglobin: 10.9 — ABNORMAL LOW
Hemoglobin: 11.9 — ABNORMAL LOW
MCHC: 32.7
MCV: 90.7
Platelets: 128 — ABNORMAL LOW
RDW: 17.7 — ABNORMAL HIGH
RDW: 17.8 — ABNORMAL HIGH
RDW: 18.6 — ABNORMAL HIGH

## 2010-12-18 LAB — POCT I-STAT 3, ART BLOOD GAS (G3+): pH, Arterial: 7.42

## 2010-12-18 LAB — POCT I-STAT 3, VENOUS BLOOD GAS (G3P V)
O2 Saturation: 65
pCO2, Ven: 55 — ABNORMAL HIGH
pH, Ven: 7.295

## 2010-12-18 LAB — PROTEIN ELECTROPHORESIS, SERUM
Gamma Globulin: 16.1
M-Spike, %: NOT DETECTED
Total Protein ELP: 5.9 — ABNORMAL LOW

## 2010-12-18 LAB — HEPARIN LEVEL (UNFRACTIONATED): Heparin Unfractionated: 0.1 — ABNORMAL LOW

## 2010-12-18 LAB — PROTIME-INR
INR: 1.2
Prothrombin Time: 15.8 — ABNORMAL HIGH

## 2010-12-18 LAB — B-NATRIURETIC PEPTIDE (CONVERTED LAB)
Pro B Natriuretic peptide (BNP): 1701 — ABNORMAL HIGH
Pro B Natriuretic peptide (BNP): >3200 — ABNORMAL HIGH

## 2010-12-18 LAB — MAGNESIUM: Magnesium: 2.3

## 2010-12-19 LAB — APTT: aPTT: 31

## 2010-12-19 LAB — HEPATIC FUNCTION PANEL
ALT: 20
AST: 25
Alkaline Phosphatase: 67
Total Protein: 5.6 — ABNORMAL LOW

## 2010-12-19 LAB — CULTURE, BLOOD (ROUTINE X 2)
Culture: NO GROWTH
Culture: NO GROWTH
Report Status: 10302009
Report Status: 10302009

## 2010-12-19 LAB — URINALYSIS, ROUTINE W REFLEX MICROSCOPIC
Hgb urine dipstick: NEGATIVE
Nitrite: POSITIVE — AB
Specific Gravity, Urine: 1.025
Urobilinogen, UA: 0.2

## 2010-12-19 LAB — COMPREHENSIVE METABOLIC PANEL
ALT: 19
AST: 26
Alkaline Phosphatase: 90
Alkaline Phosphatase: 100
BUN: 35 — ABNORMAL HIGH
CO2: 28
Chloride: 106
GFR calc Af Amer: 46 — ABNORMAL LOW
GFR calc non Af Amer: 38 — ABNORMAL LOW
Glucose, Bld: 101 — ABNORMAL HIGH
Glucose, Bld: 101 — ABNORMAL HIGH
Potassium: 4.3
Potassium: 4.4
Sodium: 141
Total Protein: 5.6 — ABNORMAL LOW

## 2010-12-19 LAB — BASIC METABOLIC PANEL
BUN: 38 — ABNORMAL HIGH
BUN: 42 — ABNORMAL HIGH
BUN: 40 — ABNORMAL HIGH
CO2: 25
CO2: 26
Calcium: 8.9
Calcium: 9
Calcium: 9.1
Chloride: 102
Chloride: 107
Chloride: 101
Chloride: 96
Chloride: 96
Creatinine, Ser: 1.64 — ABNORMAL HIGH
Creatinine, Ser: 1.79 — ABNORMAL HIGH
Creatinine, Ser: 1.59 — ABNORMAL HIGH
Creatinine, Ser: 1.75 — ABNORMAL HIGH
GFR calc Af Amer: 47 — ABNORMAL LOW
GFR calc Af Amer: 52 — ABNORMAL LOW
GFR calc Af Amer: 52 — ABNORMAL LOW
GFR calc non Af Amer: 38 — ABNORMAL LOW
GFR calc non Af Amer: 43 — ABNORMAL LOW
GFR calc non Af Amer: 43 — ABNORMAL LOW
Glucose, Bld: 111 — ABNORMAL HIGH
Glucose, Bld: 97
Glucose, Bld: 102 — ABNORMAL HIGH
Potassium: 3.8
Potassium: 4.2
Potassium: 4.8
Sodium: 137

## 2010-12-19 LAB — CBC
HCT: 32.9 — ABNORMAL LOW
HCT: 35.7 — ABNORMAL LOW
HCT: 34.6 — ABNORMAL LOW
Hemoglobin: 12.2 — ABNORMAL LOW
MCHC: 33
MCHC: 33.2
MCV: 90
MCV: 90.2
MCV: 90.7
Platelets: 109 — ABNORMAL LOW
Platelets: 129 — ABNORMAL LOW
Platelets: 118 — ABNORMAL LOW
RBC: 4.15 — ABNORMAL LOW
RDW: 17.9 — ABNORMAL HIGH
RDW: 17.9 — ABNORMAL HIGH
RDW: 18.5 — ABNORMAL HIGH
WBC: 8.5

## 2010-12-19 LAB — FOLATE: Folate: 16.2

## 2010-12-19 LAB — LIPID PANEL
HDL: 51
LDL Cholesterol: 73
Triglycerides: 58
VLDL: 12

## 2010-12-19 LAB — DIFFERENTIAL
Basophils Absolute: 0
Basophils Relative: 1
Basophils Relative: 1
Eosinophils Absolute: 0.1
Eosinophils Absolute: 0.2
Eosinophils Absolute: 0.2
Eosinophils Relative: 2
Eosinophils Relative: 2
Eosinophils Relative: 3
Lymphs Abs: 1.7
Monocytes Relative: 12
Neutrophils Relative %: 56
Neutrophils Relative %: 61
Neutrophils Relative %: 66

## 2010-12-19 LAB — CARDIAC PANEL(CRET KIN+CKTOT+MB+TROPI)
CK, MB: 2.3
CK, MB: 2.5
Relative Index: 2
Relative Index: 2.3
Total CK: 99
Troponin I: 0.3 — ABNORMAL HIGH

## 2010-12-19 LAB — TSH: TSH: 4.142

## 2010-12-19 LAB — B-NATRIURETIC PEPTIDE (CONVERTED LAB)
Pro B Natriuretic peptide (BNP): 1160 — ABNORMAL HIGH
Pro B Natriuretic peptide (BNP): 1300 — ABNORMAL HIGH
Pro B Natriuretic peptide (BNP): 1460 — ABNORMAL HIGH
Pro B Natriuretic peptide (BNP): 1318 — ABNORMAL HIGH

## 2010-12-19 LAB — VANCOMYCIN, TROUGH: Vancomycin Tr: 33

## 2010-12-19 LAB — CK TOTAL AND CKMB (NOT AT ARMC)
CK, MB: 2
Total CK: 120

## 2010-12-19 LAB — URINE CULTURE

## 2010-12-19 LAB — URINE MICROSCOPIC-ADD ON

## 2010-12-22 LAB — PROTEIN ELECTROPH W RFLX QUANT IMMUNOGLOBULINS
Beta Globulin: 5.9
Gamma Globulin: 14.3
M-Spike, %: NOT DETECTED
Total Protein ELP: 6

## 2010-12-22 LAB — RENAL FUNCTION PANEL
Albumin: 2 — ABNORMAL LOW
Albumin: 2 — ABNORMAL LOW
BUN: 262 — ABNORMAL HIGH
CO2: 18 — ABNORMAL LOW
CO2: 18 — ABNORMAL LOW
CO2: 25
Calcium: 7.6 — ABNORMAL LOW
Calcium: 8.1 — ABNORMAL LOW
Chloride: 111
Creatinine, Ser: 19.13 — ABNORMAL HIGH
GFR calc Af Amer: 3 — ABNORMAL LOW
GFR calc Af Amer: 3 — ABNORMAL LOW
GFR calc Af Amer: 4 — ABNORMAL LOW
GFR calc non Af Amer: 2 — ABNORMAL LOW
GFR calc non Af Amer: 3 — ABNORMAL LOW
GFR calc non Af Amer: 3 — ABNORMAL LOW
Phosphorus: 9 — ABNORMAL HIGH
Potassium: 3.8
Potassium: 4.7
Sodium: 150 — ABNORMAL HIGH
Sodium: 151 — ABNORMAL HIGH

## 2010-12-22 LAB — CBC
HCT: 25.9 — ABNORMAL LOW
Hemoglobin: 10.2 — ABNORMAL LOW
Hemoglobin: 8.6 — ABNORMAL LOW
MCHC: 33.9
MCHC: 34.1
MCHC: 34.6
MCV: 92.1
Platelets: 187
RBC: 2.71 — ABNORMAL LOW
RBC: 3.25 — ABNORMAL LOW
RDW: 14.3
RDW: 14.9
WBC: 16.3 — ABNORMAL HIGH

## 2010-12-22 LAB — BLOOD GAS, ARTERIAL
FIO2: 0.21 %
Patient temperature: 37
pH, Arterial: 7.486 — ABNORMAL HIGH (ref 7.350–7.450)

## 2010-12-22 LAB — IRON AND TIBC
Iron: 56
UIBC: 91

## 2010-12-22 LAB — VITAMIN B12: Vitamin B-12: 1266 — ABNORMAL HIGH (ref 211–911)

## 2010-12-22 LAB — OCCULT BLOOD X 1 CARD TO LAB, STOOL: Fecal Occult Bld: POSITIVE

## 2010-12-22 LAB — URINALYSIS, MICROSCOPIC ONLY
Glucose, UA: 250 — AB
Protein, ur: 100 — AB
Specific Gravity, Urine: 1.022
pH: 5

## 2010-12-22 LAB — CARDIAC PANEL(CRET KIN+CKTOT+MB+TROPI)
CK, MB: 7.3 — ABNORMAL HIGH
CK, MB: 7.7 — ABNORMAL HIGH
Relative Index: 2
Total CK: 365 — ABNORMAL HIGH
Total CK: 396 — ABNORMAL HIGH
Total CK: 424 — ABNORMAL HIGH
Troponin I: 0.08 — ABNORMAL HIGH

## 2010-12-22 LAB — IGG, IGA, IGM
IgA: 411 — ABNORMAL HIGH
IgM, Serum: 87

## 2010-12-22 LAB — DIFFERENTIAL
Eosinophils Absolute: 0
Eosinophils Relative: 0
Lymphs Abs: 1
Monocytes Relative: 4

## 2010-12-22 LAB — UIFE/LIGHT CHAINS/TP QN, 24-HR UR
Beta, Urine: DETECTED — AB
Free Kappa Lt Chains,Ur: 36 — ABNORMAL HIGH (ref 0.04–1.51)
Free Lambda Lt Chains,Ur: 200 — ABNORMAL HIGH (ref 0.08–1.01)
Total Protein, Urine: 322.4

## 2010-12-22 LAB — CROSSMATCH
ABO/RH(D): O POS
Antibody Screen: NEGATIVE

## 2010-12-22 LAB — COMPREHENSIVE METABOLIC PANEL
ALT: 33
AST: 11
Calcium: 8.2 — ABNORMAL LOW
GFR calc Af Amer: 3 — ABNORMAL LOW
Sodium: 154 — ABNORMAL HIGH
Total Protein: 6.1

## 2010-12-22 LAB — HEMOGLOBIN A1C
Hgb A1c MFr Bld: 6.1
Mean Plasma Glucose: 140

## 2010-12-22 LAB — URINE CULTURE

## 2010-12-22 LAB — POCT I-STAT 3, ART BLOOD GAS (G3+)
Acid-base deficit: 6 — ABNORMAL HIGH
O2 Saturation: 95
TCO2: 19
pCO2 arterial: 28.6 — ABNORMAL LOW
pO2, Arterial: 68 — ABNORMAL LOW

## 2010-12-22 LAB — ANTI-NEUTROPHIL ANTIBODY: Cytoplasmic Neutrophilic Ab: <1:20 {titer}

## 2010-12-22 LAB — C3 COMPLEMENT: C3 Complement: 151

## 2010-12-22 LAB — MAGNESIUM: Magnesium: 5.1 — ABNORMAL HIGH

## 2010-12-22 LAB — RETICULOCYTES
RBC.: 3.15 — ABNORMAL LOW
Retic Count, Absolute: 25.2
Retic Ct Pct: 0.8

## 2010-12-22 LAB — SEDIMENTATION RATE: Sed Rate: 123 — ABNORMAL HIGH

## 2010-12-22 LAB — TSH: TSH: 0.443

## 2010-12-22 LAB — GLOMERULAR BASEMENT MEMBRANE ANTIBODIES: GBM Ab: 0 AU/mL (ref 0–19)

## 2011-02-06 ENCOUNTER — Other Ambulatory Visit: Payer: Self-pay | Admitting: *Deleted

## 2011-02-06 MED ORDER — AMLODIPINE BESYLATE 10 MG PO TABS: 10.0000 mg | ORAL_TABLET | Freq: Every day | ORAL | Status: DC

## 2011-02-20 ENCOUNTER — Other Ambulatory Visit: Payer: Self-pay | Admitting: *Deleted

## 2011-02-20 MED ORDER — CARVEDILOL 6.25 MG PO TABS: 6.2500 mg | ORAL_TABLET | Freq: Two times a day (BID) | ORAL | Status: DC

## 2011-03-02 ENCOUNTER — Encounter: Payer: Self-pay | Admitting: Cardiology

## 2011-03-05 ENCOUNTER — Other Ambulatory Visit: Payer: Self-pay | Admitting: *Deleted

## 2011-03-05 MED ORDER — AMLODIPINE BESYLATE 10 MG PO TABS: 10.0000 mg | ORAL_TABLET | Freq: Every day | ORAL | Status: DC

## 2011-03-05 MED ORDER — FUROSEMIDE 40 MG PO TABS: 20.0000 mg | ORAL_TABLET | Freq: Every day | ORAL | Status: DC

## 2011-03-26 ENCOUNTER — Other Ambulatory Visit: Payer: Self-pay | Admitting: Cardiology

## 2011-03-26 NOTE — Telephone Encounter (Signed)
PT IS OUT

## 2011-03-27 ENCOUNTER — Other Ambulatory Visit: Payer: Self-pay | Admitting: *Deleted

## 2011-03-27 MED ORDER — AMLODIPINE BESYLATE 10 MG PO TABS: 10.0000 mg | ORAL_TABLET | Freq: Every day | ORAL | Status: DC

## 2011-03-28 ENCOUNTER — Encounter: Payer: Self-pay | Admitting: Cardiology

## 2011-03-28 ENCOUNTER — Ambulatory Visit (INDEPENDENT_AMBULATORY_CARE_PROVIDER_SITE_OTHER): Payer: Medicare Other | Admitting: Cardiology

## 2011-03-28 VITALS — BP 174/75 | HR 65 | Resp 18 | Ht 68.0 in | Wt 167.0 lb

## 2011-03-28 DIAGNOSIS — I2510 Atherosclerotic heart disease of native coronary artery without angina pectoris: Secondary | ICD-10-CM

## 2011-03-28 DIAGNOSIS — I10 Essential (primary) hypertension: Secondary | ICD-10-CM

## 2011-03-28 DIAGNOSIS — I429 Cardiomyopathy, unspecified: Secondary | ICD-10-CM

## 2011-03-28 NOTE — Patient Instructions (Signed)
**Note De-identified  Obfuscation** Your physician recommends that you continue on your current medications as directed. Please refer to the Current Medication list given to you today.  Your physician recommends that you schedule a follow-up appointment in: 6 months  

## 2011-03-28 NOTE — Assessment & Plan Note (Signed)
Nonobstructive disease by prior testing, no active angina. He continues on aspirin and statin.

## 2011-03-28 NOTE — Progress Notes (Signed)
   Clinical Summary [name] Rice is a 76 y.o.male presenting for followup. He was seen in February 2012. He recently returned from living in [place] for 2 months with his sister. He reports interval compliance with his medications. He has had no hospitalizations, no progressive shortness of breath or chest pain.  Echocardiogram from 2/12 showed LVEF 55-60%, mild aortic and mitral regurgitation.  Blood pressure is elevated today. He has had no regular medical followup in the interim. ACE Inhibitor dosing is low at this time.  Still has mild lower family edema and stasis. No orthopnea or PND.   No Known Allergies  Current Outpatient Prescriptions  Medication Sig Dispense Refill  . amLODipine (NORVASC) 10 MG tablet Take 1 tablet (10 mg total) by mouth daily.  90 tablet  3  . aspirin 81 MG tablet Take 81 mg by mouth daily.        . carvedilol (COREG) 6.25 MG tablet Take 1 tablet (6.25 mg total) by mouth 2 (two) times daily with a meal.  60 tablet  1  . furosemide (LASIX) 40 MG tablet Take 0.5 tablets (20 mg total) by mouth daily.  30 tablet  0  . lisinopril (PRINIVIL,ZESTRIL) 5 MG tablet Take 1 tablet (5 mg total) by mouth daily.  30 tablet  1  . simvastatin (ZOCOR) 5 MG tablet Take 1 tablet (5 mg total) by mouth at bedtime.  30 tablet  6  . tiotropium (SPIRIVA) 18 MCG inhalation capsule Place 18 mcg into inhaler and inhale daily.          Past Medical History  Diagnosis Date  . NICM (nonischemic cardiomyopathy)     LVEF 55% 3/11  . Coronary atherosclerosis of native coronary artery     Nonobstructive  . Essential hypertension, benign   . CKD (chronic kidney disease), stage III   . COPD (chronic obstructive pulmonary disease)   . Pulmonary hypertension   . Mitral regurgitation     Moderate  . Ventricular dysfunction, right   . Tricuspid regurgitation   . Lower extremity edema   . Paroxysmal atrial fibrillation   . GERD (gastroesophageal reflux disease)   . Bradycardia   .  DJD (degenerative joint disease)     Past Surgical History  Procedure Date  . Dialysis fistula creation     AV    Family History  Problem Relation Age of Onset  . Cancer Brother     Social History Mr. Phebus reports that he has quit smoking. His smoking use included Cigarettes. He has never used smokeless tobacco. Mr. Gigante reports that he does not drink alcohol.  Review of Systems Negative except as outlined above.  Physical Examination Filed Vitals:   03/28/11 1447  BP: 174/75  Pulse: 65  Resp: 18   Chronically ill-appearing male in no acute distress, ambulates with walker.  HEENT: Conjunctiva and lids are normal, oropharynx clear with poor dentition.  Neck: Supple, no loud carotid bruits. Jugular venous pressure approximately 8 cm of water.  Lungs: Diminished breath sounds, otherwise clear without wheezing. Nonlabored breathing at rest.  Cardiac: Regular rate and rhythm, indistinct PMI, no S3 gallop.  Abdomen: Protuberant, bowel sounds present, no tenderness.  Extremities: Chronic appearing 1+ edema and venous stasis with diminished distal pulses.  Skin: Warm and dry.  Musculoskeletal: Mild kyphosis.  Neuropsychiatric: Alert and oriented x3, affect appropriate.   ECG Sinus rhythm at 67 with PACs and PVCs, nonspecific ST changes.   Problem List and Plan

## 2011-03-28 NOTE — Assessment & Plan Note (Signed)
Nonischemic, with improvement in LVEF to normal on medical therapy. Echocardiogram from last year was reviewed. Recommended that he continue to observe sodium restriction, watch his diet, ambulate as tolerated. He needs to have better followup of blood pressure.

## 2011-03-28 NOTE — Assessment & Plan Note (Signed)
He is to schedule a followup visit with Dr. Margo Aye. Should have closer blood pressure review. May need to further increase ACE inhibitor.

## 2011-04-23 ENCOUNTER — Other Ambulatory Visit: Payer: Self-pay | Admitting: *Deleted

## 2011-04-23 MED ORDER — CARVEDILOL 6.25 MG PO TABS: 6.2500 mg | ORAL_TABLET | Freq: Two times a day (BID) | ORAL | Status: DC

## 2011-04-30 ENCOUNTER — Other Ambulatory Visit: Payer: Self-pay

## 2011-04-30 ENCOUNTER — Telehealth: Payer: Self-pay | Admitting: Cardiology

## 2011-04-30 MED ORDER — AMLODIPINE BESYLATE 10 MG PO TABS: 10.0000 mg | ORAL_TABLET | Freq: Every day | ORAL | Status: DC

## 2011-04-30 NOTE — Telephone Encounter (Signed)
Please okay refill on Amlodopine 10mg  QD to [facility] in [place]. / tg

## 2011-04-30 NOTE — Telephone Encounter (Signed)
**Note De-Identified  Obfuscation** RX faxed to and called into [facility] in [place]./LV

## 2011-05-01 ENCOUNTER — Other Ambulatory Visit: Payer: Self-pay | Admitting: *Deleted

## 2011-05-01 MED ORDER — FUROSEMIDE 40 MG PO TABS: 20.0000 mg | ORAL_TABLET | Freq: Every day | ORAL | Status: DC

## 2011-06-18 ENCOUNTER — Other Ambulatory Visit: Payer: Self-pay | Admitting: *Deleted

## 2011-06-18 MED ORDER — SIMVASTATIN 5 MG PO TABS: 5.0000 mg | ORAL_TABLET | Freq: Every day | ORAL | Status: DC

## 2011-08-23 ENCOUNTER — Inpatient Hospital Stay (HOSPITAL_COMMUNITY): Payer: Medicare Other

## 2011-08-23 ENCOUNTER — Encounter (HOSPITAL_COMMUNITY): Payer: Self-pay | Admitting: General Practice

## 2011-08-23 ENCOUNTER — Inpatient Hospital Stay (HOSPITAL_COMMUNITY)
Admission: AD | Admit: 2011-08-23 | Discharge: 2011-08-27 | DRG: 603 | Disposition: A | Discharge status: Home / Self Care | Payer: Medicare Other | Source: Ambulatory Visit | Attending: Internal Medicine | Admitting: Internal Medicine

## 2011-08-23 DIAGNOSIS — M7989 Other specified soft tissue disorders: Secondary | ICD-10-CM

## 2011-08-23 DIAGNOSIS — I2510 Atherosclerotic heart disease of native coronary artery without angina pectoris: Secondary | ICD-10-CM | POA: Diagnosis present

## 2011-08-23 DIAGNOSIS — I10 Essential (primary) hypertension: Secondary | ICD-10-CM

## 2011-08-23 DIAGNOSIS — N189 Chronic kidney disease, unspecified: Secondary | ICD-10-CM

## 2011-08-23 DIAGNOSIS — L02419 Cutaneous abscess of limb, unspecified: Secondary | ICD-10-CM

## 2011-08-23 DIAGNOSIS — I429 Cardiomyopathy, unspecified: Secondary | ICD-10-CM | POA: Diagnosis present

## 2011-08-23 DIAGNOSIS — S82109A Unspecified fracture of upper end of unspecified tibia, initial encounter for closed fracture: Secondary | ICD-10-CM | POA: Diagnosis present

## 2011-08-23 DIAGNOSIS — I059 Rheumatic mitral valve disease, unspecified: Secondary | ICD-10-CM | POA: Diagnosis present

## 2011-08-23 DIAGNOSIS — I129 Hypertensive chronic kidney disease with stage 1 through stage 4 chronic kidney disease, or unspecified chronic kidney disease: Secondary | ICD-10-CM | POA: Diagnosis present

## 2011-08-23 DIAGNOSIS — E785 Hyperlipidemia, unspecified: Secondary | ICD-10-CM | POA: Diagnosis present

## 2011-08-23 DIAGNOSIS — E782 Mixed hyperlipidemia: Secondary | ICD-10-CM

## 2011-08-23 DIAGNOSIS — J449 Chronic obstructive pulmonary disease, unspecified: Secondary | ICD-10-CM | POA: Diagnosis present

## 2011-08-23 DIAGNOSIS — X58XXXA Exposure to other specified factors, initial encounter: Secondary | ICD-10-CM | POA: Diagnosis present

## 2011-08-23 DIAGNOSIS — I428 Other cardiomyopathies: Secondary | ICD-10-CM | POA: Diagnosis present

## 2011-08-23 DIAGNOSIS — L03119 Cellulitis of unspecified part of limb: Secondary | ICD-10-CM

## 2011-08-23 DIAGNOSIS — N259 Disorder resulting from impaired renal tubular function, unspecified: Secondary | ICD-10-CM

## 2011-08-23 DIAGNOSIS — N183 Chronic kidney disease, stage 3 unspecified: Secondary | ICD-10-CM | POA: Diagnosis present

## 2011-08-23 DIAGNOSIS — J4489 Other specified chronic obstructive pulmonary disease: Secondary | ICD-10-CM | POA: Diagnosis present

## 2011-08-23 DIAGNOSIS — L0390 Cellulitis, unspecified: Secondary | ICD-10-CM | POA: Diagnosis present

## 2011-08-23 LAB — COMPREHENSIVE METABOLIC PANEL
Alkaline Phosphatase: 79 U/L (ref 39–117)
BUN: 31 mg/dL — ABNORMAL HIGH (ref 6–23)
CO2: 24 mEq/L (ref 19–32)
Chloride: 104 mEq/L (ref 96–112)
GFR calc Af Amer: 43 mL/min — ABNORMAL LOW (ref >90)
Glucose, Bld: 106 mg/dL — ABNORMAL HIGH (ref 70–99)
Potassium: 4 mEq/L (ref 3.5–5.1)
Total Bilirubin: 0.4 mg/dL (ref 0.3–1.2)

## 2011-08-23 LAB — CBC
HCT: 32.2 % — ABNORMAL LOW (ref 39.0–52.0)
Hemoglobin: 10.7 g/dL — ABNORMAL LOW (ref 13.0–17.0)
RBC: 3.49 MIL/uL — ABNORMAL LOW (ref 4.22–5.81)
WBC: 8.2 10*3/uL (ref 4.0–10.5)

## 2011-08-23 LAB — PRO B NATRIURETIC PEPTIDE: Pro B Natriuretic peptide (BNP): 458.3 pg/mL — ABNORMAL HIGH (ref 0–125)

## 2011-08-23 MED ORDER — SODIUM CHLORIDE 0.9 % IV SOLN
250.0000 mL | INTRAVENOUS | Status: DC | PRN
  Administered 2011-08-23: 250 mL via INTRAVENOUS

## 2011-08-23 MED ORDER — ACETAMINOPHEN 325 MG PO TABS: 650.0000 mg | ORAL_TABLET | Freq: Four times a day (QID) | ORAL | Status: DC | PRN

## 2011-08-23 MED ORDER — CARVEDILOL 6.25 MG PO TABS
6.2500 mg | ORAL_TABLET | Freq: Two times a day (BID) | ORAL | Status: DC
  Administered 2011-08-23 – 2011-08-27 (×8): 6.25 mg via ORAL
  Filled 2011-08-23 (×11): qty 1

## 2011-08-23 MED ORDER — SODIUM CHLORIDE 0.9 % IJ SOLN
3.0000 mL | Freq: Two times a day (BID) | INTRAMUSCULAR | Status: DC
  Administered 2011-08-25 – 2011-08-26 (×4): 3 mL via INTRAVENOUS

## 2011-08-23 MED ORDER — FUROSEMIDE 20 MG PO TABS
20.0000 mg | ORAL_TABLET | Freq: Every day | ORAL | Status: DC
  Administered 2011-08-24 – 2011-08-27 (×4): 20 mg via ORAL
  Filled 2011-08-23 (×4): qty 1

## 2011-08-23 MED ORDER — PNEUMOCOCCAL VAC POLYVALENT 25 MCG/0.5ML IJ INJ
0.5000 mL | INJECTION | INTRAMUSCULAR | Status: AC
  Filled 2011-08-23: qty 0.5

## 2011-08-23 MED ORDER — ALUM & MAG HYDROXIDE-SIMETH 200-200-20 MG/5ML PO SUSP: 30.0000 mL | Freq: Four times a day (QID) | ORAL | Status: DC | PRN

## 2011-08-23 MED ORDER — ACETAMINOPHEN 650 MG RE SUPP: 650.0000 mg | Freq: Four times a day (QID) | RECTAL | Status: DC | PRN

## 2011-08-23 MED ORDER — SODIUM CHLORIDE 0.9 % IJ SOLN
3.0000 mL | Freq: Two times a day (BID) | INTRAMUSCULAR | Status: DC
  Administered 2011-08-25: 3 mL via INTRAVENOUS

## 2011-08-23 MED ORDER — SODIUM CHLORIDE 0.9 % IJ SOLN: 3.0000 mL | INTRAMUSCULAR | Status: DC | PRN

## 2011-08-23 MED ORDER — ASPIRIN 81 MG PO TABS: 81.0000 mg | ORAL_TABLET | Freq: Every day | ORAL | Status: DC

## 2011-08-23 MED ORDER — MORPHINE SULFATE 2 MG/ML IJ SOLN
1.0000 mg | INTRAMUSCULAR | Status: DC | PRN
Start: 2011-08-23 — End: 2011-08-27

## 2011-08-23 MED ORDER — AMLODIPINE BESYLATE 10 MG PO TABS
10.0000 mg | ORAL_TABLET | Freq: Every day | ORAL | Status: DC
  Administered 2011-08-24 – 2011-08-27 (×4): 10 mg via ORAL
  Filled 2011-08-23 (×4): qty 1

## 2011-08-23 MED ORDER — HYDROCODONE-ACETAMINOPHEN 5-325 MG PO TABS
1.0000 | ORAL_TABLET | ORAL | Status: DC | PRN
  Administered 2011-08-25 – 2011-08-26 (×3): 1 via ORAL
  Filled 2011-08-23 (×3): qty 1

## 2011-08-23 MED ORDER — TIOTROPIUM BROMIDE MONOHYDRATE 18 MCG IN CAPS
18.0000 ug | ORAL_CAPSULE | Freq: Every day | RESPIRATORY_TRACT | Status: DC
  Administered 2011-08-24 – 2011-08-27 (×4): 18 ug via RESPIRATORY_TRACT
  Filled 2011-08-23: qty 5

## 2011-08-23 MED ORDER — SIMVASTATIN 5 MG PO TABS
5.0000 mg | ORAL_TABLET | Freq: Every day | ORAL | Status: DC
  Administered 2011-08-23 – 2011-08-26 (×4): 5 mg via ORAL
  Filled 2011-08-23 (×5): qty 1

## 2011-08-23 MED ORDER — HYDRALAZINE HCL 50 MG PO TABS
50.0000 mg | ORAL_TABLET | Freq: Three times a day (TID) | ORAL | Status: DC
  Administered 2011-08-23 – 2011-08-27 (×12): 50 mg via ORAL
  Filled 2011-08-23 (×14): qty 1

## 2011-08-23 MED ORDER — SODIUM CHLORIDE 0.9 % IV SOLN
3.0000 g | Freq: Three times a day (TID) | INTRAVENOUS | Status: DC
  Administered 2011-08-23 – 2011-08-27 (×10): 3 g via INTRAVENOUS
  Filled 2011-08-23 (×15): qty 3

## 2011-08-23 MED ORDER — ENOXAPARIN SODIUM 40 MG/0.4ML ~~LOC~~ SOLN
40.0000 mg | SUBCUTANEOUS | Status: DC
  Administered 2011-08-23 – 2011-08-26 (×4): 40 mg via SUBCUTANEOUS
  Filled 2011-08-23 (×5): qty 0.4

## 2011-08-23 MED ORDER — ONDANSETRON HCL 4 MG/2ML IJ SOLN: 4.0000 mg | Freq: Four times a day (QID) | INTRAMUSCULAR | Status: DC | PRN

## 2011-08-23 MED ORDER — ALBUTEROL SULFATE (5 MG/ML) 0.5% IN NEBU: 2.5000 mg | INHALATION_SOLUTION | RESPIRATORY_TRACT | Status: DC | PRN

## 2011-08-23 MED ORDER — ONDANSETRON HCL 4 MG PO TABS: 4.0000 mg | ORAL_TABLET | Freq: Four times a day (QID) | ORAL | Status: DC | PRN

## 2011-08-23 MED ORDER — VANCOMYCIN HCL IN DEXTROSE 1-5 GM/200ML-% IV SOLN
1000.0000 mg | INTRAVENOUS | Status: DC
  Administered 2011-08-23 – 2011-08-26 (×4): 1000 mg via INTRAVENOUS
  Filled 2011-08-23 (×5): qty 200

## 2011-08-23 MED ORDER — SENNA 8.6 MG PO TABS
1.0000 | ORAL_TABLET | Freq: Two times a day (BID) | ORAL | Status: DC
  Administered 2011-08-23 – 2011-08-26 (×7): 8.6 mg via ORAL
  Filled 2011-08-23 (×10): qty 1

## 2011-08-23 MED ORDER — ZOLPIDEM TARTRATE 5 MG PO TABS: 5.0000 mg | ORAL_TABLET | Freq: Every evening | ORAL | Status: DC | PRN

## 2011-08-23 MED ORDER — ASPIRIN 81 MG PO CHEW
81.0000 mg | CHEWABLE_TABLET | Freq: Every day | ORAL | Status: DC
  Administered 2011-08-24 – 2011-08-27 (×4): 81 mg via ORAL
  Filled 2011-08-23 (×4): qty 1

## 2011-08-23 NOTE — Progress Notes (Signed)
*  Preliminary Results* Bilateral lower extremity venous duplex completed. There is no evidence of deep vein thrombosis involving the right lower extremity and left lower extremity. The left mid to distal femoral vein and proximal popliteal vein segments were noncompressible and dilated. This is likely due to swelling and edema, however hyperacute thrombus cannot be excluded.  08/23/2011 2:52 PM  Elpidio Galea, RDMS,RDCS

## 2011-08-23 NOTE — H&P (Addendum)
PATIENT DETAILS Name: [name] Rice Age: 76 y.o. Sex: male Date of Birth: 09-11-1935 Admit Date: 08/23/2011 ZOX:WRUE,AVWU, MD, MD   CHIEF COMPLAINT:  Left leg pain swelling and erythema for the past 7 days  HPI: Patient is a 76 year old Caucasian male with a past medical history of nonischemic cardiomyopathy, history of nonobstructive coronary artery disease, stage III chronic kidney disease, hypertension was referred to the hospital as a direct admission for the above noted complaint. The patient, 5 years ago he started having burning sensation in his left lower extremity, this was associated with worsening erythema and worsening swelling. He denies any fever. He then went to his primary care practitioner today who then referred him to [facility] as a direct admission. Patient denies any headache, patient denies any chest pain or shortness of breath. Patient denies any abdominal pain. Patient denies any nausea, vomiting or diarrhea.   ALLERGIES:  No Known Allergies  PAST MEDICAL HISTORY: Past Medical History  Diagnosis Date  . NICM (nonischemic cardiomyopathy)     LVEF 55% 3/11  . Coronary atherosclerosis of native coronary artery     Nonobstructive  . Essential hypertension, benign   . CKD (chronic kidney disease), stage III   . COPD (chronic obstructive pulmonary disease)   . Pulmonary hypertension   . Mitral regurgitation     Moderate  . Ventricular dysfunction, right   . Tricuspid regurgitation   . Lower extremity edema   . Paroxysmal atrial fibrillation   . GERD (gastroesophageal reflux disease)   . Bradycardia   . DJD (degenerative joint disease)     PAST SURGICAL HISTORY: Past Surgical History  Procedure Date  . Dialysis fistula creation     AV    MEDICATIONS AT HOME: Prior to Admission medications   Medication Sig Start Date End Date Taking? Authorizing Provider  amLODipine (NORVASC) 10 MG tablet Take 10 mg by mouth daily. 04/30/11 04/29/12 Yes Jonelle Sidle, MD  aspirin 81 MG tablet Take 81 mg by mouth daily.     Yes Historical Provider, MD  carvedilol (COREG) 6.25 MG tablet Take 6.25 mg by mouth 2 (two) times daily with a meal. 04/23/11 04/22/12 Yes Jonelle Sidle, MD  furosemide (LASIX) 40 MG tablet Take 20 mg by mouth daily.   Yes Historical Provider, MD  hydrALAZINE (APRESOLINE) 50 MG tablet Take 50 mg by mouth 3 (three) times daily.   Yes Historical Provider, MD  simvastatin (ZOCOR) 5 MG tablet Take 5 mg by mouth at bedtime. 06/18/11 06/17/12 Yes Jonelle Sidle, MD  tiotropium (SPIRIVA) 18 MCG inhalation capsule Place 18 mcg into inhaler and inhale daily.    Yes Historical Provider, MD    FAMILY HISTORY: Family History  Problem Relation Age of Onset  . Cancer Brother     SOCIAL HISTORY:  reports that he has quit smoking. His smoking use included Cigarettes. He has never used smokeless tobacco. He reports that he does not drink alcohol. His drug history not on file.  REVIEW OF SYSTEMS:  Constitutional:   No  weight loss, night sweats,  Fevers, chills, fatigue.  HEENT:    No headaches, Difficulty swallowing,Tooth/dental problems,Sore throat,  No sneezing, itching, ear ache, nasal congestion, post nasal drip,   Cardio-vascular: No chest pain,  Orthopnea, PND, swelling in lower extremities, anasarca, dizziness, palpitations  GI:  No heartburn, indigestion, abdominal pain, nausea, vomiting, diarrhea, change in bowel habits, loss of appetite  Resp: No shortness of breath with exertion or at  rest.  No excess mucus, no productive cough, No non-productive cough,  No coughing up of blood.No change in color of mucus.No wheezing.No chest wall deformity  Skin:  no rash or lesions.  GU:  no dysuria, change in color of urine, no urgency or frequency.  No flank pain.  Musculoskeletal: No joint pain or swelling.  No decreased range of motion.  No back pain.  Psych: No change in mood or affect. No depression or anxiety.  No  memory loss.   PHYSICAL EXAM: Blood pressure 162/63, pulse 66, temperature 97.5 F (36.4 C), temperature source Oral, resp. rate 20, SpO2 95.00%.  General appearance :Awake, alert, not in any distress. Speech Clear. Not toxic Looking HEENT: Atraumatic and Normocephalic, pupils equally reactive to light and accomodation Neck: supple, no JVD. No cervical lymphadenopathy.  Chest:Good air entry bilaterally, no added sounds  CVS: S1 S2 regular, no murmurs.  Abdomen: Bowel sounds present, Non tender and not distended with no gaurding, rigidity or rebound. Extremities: Left lower extremity is grossly swollen particularly in the calf area, is diffuse erythema extending up to the mid leg. Although swollen, left leg is not tense. Sensation is intact. Leg is warm and there is good capillary refill. Left leg is chronically short than the right leg. Right lower extremity showed chronic skin changes. Neurology: Awake alert, and oriented X 3, CN II-XII intact, Non focal, Skin:No Rash Wounds:N/A  LABS ON ADMISSION:   Basename 08/23/11 1333  NA 141  K 4.0  CL 104  CO2 24  GLUCOSE 106*  BUN 31*  CREATININE 1.74*  CALCIUM 9.7  MG --  PHOS --    Basename 08/23/11 1333  AST 13  ALT 8  ALKPHOS 79  BILITOT 0.4  PROT 7.6  ALBUMIN 4.0   No results found for this basename: LIPASE:2,AMYLASE:2 in the last 72 hours  Basename 08/23/11 1333  WBC 8.2  NEUTROABS --  HGB 10.7*  HCT 32.2*  MCV 92.3  PLT 141*   No results found for this basename: CKTOTAL:3,CKMB:3,CKMBINDEX:3,TROPONINI:3 in the last 72 hours No results found for this basename: DDIMER:2 in the last 72 hours No components found with this basename: POCBNP:3   RADIOLOGIC STUDIES ON ADMISSION: Dg Chest 2 View  08/23/2011  *RADIOLOGY REPORT*  Clinical Data: 76 year old male with cough, obstructive pulmonary disease.  Question CHF.  CHEST - 2 VIEW  Comparison: 01/19/2008 and earlier.  Findings: Chronic large lung volumes.  Cardiac size  is stable at the upper limits of normal.  Chronic tortuous thoracic aorta. Other mediastinal contours are within normal limits.  Visualized tracheal air column is within normal limits.  No pneumothorax, pulmonary edema, pleural effusion or confluent pulmonary opacity.  There is chronic apical scarring.  Asymmetry in the left lung apex appears increased from prior exams, but significance is unclear. No acute osseous abnormality identified.  IMPRESSION: 1.  No acute pneumonia or evidence of congestive heart failure identified. 2.  Chronic lung disease.  Asymmetric opacity in the left apex has increased since 2009.  This may be related to scarring, but recommend follow-up chest CT (IV contrast preferred) to characterize further.  These results will be called to the ordering clinician or representative by the Radiologist Assistant, and communication documented in the PACS Dashboard.  Original Report Authenticated By: Harley Hallmark, M.D.    ASSESSMENT AND PLAN: Present on Admission:  . Left leg Cellulitis/soft tissue infection  -Will empirically started on vancomycin and Unasyn  -Dopplers of the left lower extremity showed  no obvious DVT, given swelling erythema we will get an MRI of the left lower extremity to make sure that this is infectious etiology and not an underlying hematoma/ruptured Bakers cyst.   .RENAL INSUFFICIENCY -At baseline -Chronic kidney disease stage III at baseline   .HYPERLIPIDEMIA -Continue with statin   .Essential hypertension, benign -Continue the usual antihypertensive   .CORONARY ATHEROSCLEROSIS NATIVE CORONARY ARTERY -Apparently has a history of nonobstructive coronary artery disease  -Continue with aspirin for now, if any abscess seen on the MRI will discontinue it   .Secondary cardiomyopathy -Clinically euvolemic -Last echocardiogram done in 2012 shows a preserved ejection fraction  Further plan will depend as patient's clinical course evolves and further  radiologic and laboratory data become available. Patient will be monitored closely.  DVT Prophylaxis: -We'll place on prophylactic Lovenox-if obvious hematoma on the MRI then we'll stop  Code Status: -Full code  Total time spent for admission equals 45 minutes.  Jeoffrey Massed 08/23/2011, 3:03 PM

## 2011-08-23 NOTE — Progress Notes (Signed)
ANTIBIOTIC CONSULT NOTE - INITIAL  Pharmacy Consult for Unasyn/Vancomycin Indication: cellulitis  No Known Allergies  Patient Measurements: Height: 5\' 7"  (170.2 cm) Weight: 174 lb 2.6 oz (79 kg) IBW/kg (Calculated) : 66.1    Vital Signs: Temp: 98 F (36.7 C) (06/06 1630) Temp src: Oral (06/06 1630) BP: 124/60 mmHg (06/06 1630) Pulse Rate: 61  (06/06 1630) I    Labs:  Basename 08/23/11 1333  WBC 8.2  HGB 10.7*  PLT 141*  LABCREA --  CREATININE 1.74*     Microbiology: No results found for this or any previous visit (from the past 720 hour(s)).  Medical History: Past Medical History  Diagnosis Date  . NICM (nonischemic cardiomyopathy)     LVEF 55% 3/11  . Coronary atherosclerosis of native coronary artery     Nonobstructive  . Essential hypertension, benign   . CKD (chronic kidney disease), stage III   . COPD (chronic obstructive pulmonary disease)   . Pulmonary hypertension   . Mitral regurgitation     Moderate  . Ventricular dysfunction, right   . Tricuspid regurgitation   . Lower extremity edema   . Paroxysmal atrial fibrillation   . GERD (gastroesophageal reflux disease)   . Bradycardia   . DJD (degenerative joint disease)      Assessment: 76yom admitted for LLE cellulitis.  Plan is for empiric antibiotics Unasyn and Vancomycin.  Goal of Therapy:  Vancomycin trough level 10-15 mcg/ml  Plan:  Unasyn 3Gm q8 Vancomycin 1GM q24  Marcelino Scot 08/23/2011,4:37 PM

## 2011-08-23 NOTE — Care Management Note (Signed)
    Page 1 of 2   08/27/2011     11:18:39 AM   CARE MANAGEMENT NOTE 08/27/2011  Patient:  [name] Rice,[name] Rice   Account Number:  [id number]  Date Initiated:  08/23/2011  Documentation initiated by:  Letha Cape  Subjective/Objective Assessment:   dx cellulitis  admit- lives alone, pta independent, uses rolling walker at home, also has a cane but does not use cane.     Action/Plan:   pt eval- rec hhpt.   Anticipated DC Date:  08/27/2011   Anticipated DC Plan:  HOME W HOME HEALTH SERVICES      DC Planning Services  CM consult      [facility]  HOME HEALTH   Choice offered to / List presented to:  C-1 Patient   DME arranged  OTHER - SEE COMMENT      DME agency  [facility]     HH arranged  HH-2 PT      [facility] agency  [facility]   Status of service:  Completed, signed off Medicare Important Message given?   (If response is "NO", the following Medicare IM given date fields will be blank) Date Medicare IM given:   Date Additional Medicare IM given:    Discharge Disposition:  HOME W HOME HEALTH SERVICES  Per UR Regulation:  Reviewed for med. necessity/level of care/duration of stay  If discussed at Long Length of Stay Meetings, dates discussed:    Comments:  08/27/11 11:16 Letha Cape RN, BSN [phone] patient for dc today, gave patient list of HH agencies , he chose [facility] for hhpt, referral made to [facility], Marie notified. Also Darrin notified for rollator with seat.  Patient's first cousin is here for his transport home.  Soc will begin 24-48 hrs post discharge.  08/23/11 16:33 Letha Cape RN,BSN [phone] patient lives alone, pta independent, patient's 1st cousin , [name] takes him to his MD appts.  Patient has medication coverage and transportation.  NCM will continue to follow for dc needs.

## 2011-08-24 ENCOUNTER — Inpatient Hospital Stay (HOSPITAL_COMMUNITY): Payer: Medicare Other

## 2011-08-24 DIAGNOSIS — L02419 Cutaneous abscess of limb, unspecified: Secondary | ICD-10-CM

## 2011-08-24 DIAGNOSIS — E782 Mixed hyperlipidemia: Secondary | ICD-10-CM

## 2011-08-24 DIAGNOSIS — I10 Essential (primary) hypertension: Secondary | ICD-10-CM

## 2011-08-24 DIAGNOSIS — L03119 Cellulitis of unspecified part of limb: Secondary | ICD-10-CM

## 2011-08-24 DIAGNOSIS — N189 Chronic kidney disease, unspecified: Secondary | ICD-10-CM

## 2011-08-24 LAB — CBC
HCT: 28.2 % — ABNORMAL LOW (ref 39.0–52.0)
MCHC: 33 g/dL (ref 30.0–36.0)
MCV: 91 fL (ref 78.0–100.0)
Platelets: 126 10*3/uL — ABNORMAL LOW (ref 150–400)
RDW: 14.1 % (ref 11.5–15.5)

## 2011-08-24 LAB — COMPREHENSIVE METABOLIC PANEL
AST: 11 U/L (ref 0–37)
Albumin: 3.1 g/dL — ABNORMAL LOW (ref 3.5–5.2)
BUN: 22 mg/dL (ref 6–23)
Creatinine, Ser: 1.51 mg/dL — ABNORMAL HIGH (ref 0.50–1.35)
Potassium: 3.2 mEq/L — ABNORMAL LOW (ref 3.5–5.1)
Total Protein: 6.2 g/dL (ref 6.0–8.3)

## 2011-08-24 MED ORDER — POTASSIUM CHLORIDE CRYS ER 20 MEQ PO TBCR
40.0000 meq | EXTENDED_RELEASE_TABLET | Freq: Once | ORAL | Status: AC
  Administered 2011-08-24: 40 meq via ORAL
  Filled 2011-08-24: qty 2

## 2011-08-24 NOTE — Progress Notes (Signed)
PATIENT DETAILS Name: [name] Rice Age: 76 y.o. Sex: male Date of Birth: [birth year] Admit Date: 08/23/2011 TIR:WERX,VQMG, MD, MD  Subjective: Left Leg Cellulitis slightly better Denies any recent trauma or fall !!  Objective: Vital signs in last 24 hours: Filed Vitals:   08/24/11 0447 08/24/11 0500 08/24/11 0738 08/24/11 0914  BP: 109/59  133/61   Pulse: 61  60   Temp: 98.2 F (36.8 C)     TempSrc: Oral     Resp: 18     Height:      Weight:  79.47 kg (175 lb 3.2 oz)    SpO2: 92%   92%    Weight change:   Body mass index is 27.44 kg/(m^2).  Intake/Output from previous day:  Intake/Output Summary (Last 24 hours) at 08/24/11 1000 Last data filed at 08/24/11 0600  Gross per 24 hour  Intake 430.17 ml  Output    900 ml  Net -469.83 ml    PHYSICAL EXAM: Gen Exam: Awake and alert with clear speech.  Neck: Supple, No JVD.   Chest: B/L Clear.   CVS: S1 S2 Regular, no murmurs.  Abdomen: soft, BS +, non tender, non distended.  Extremities:LLE-slight decrease in swelling, decrease in erythema from marked area Neurologic: Non Focal.   Skin: No Rash.   Wounds: N/A.    CONSULTS:  orthopedic surgery-spoke with Dr Charlann Boxer  LAB RESULTS: CBC  Lab 08/24/11 0648 08/23/11 1333  WBC 7.2 8.2  HGB 9.3* 10.7*  HCT 28.2* 32.2*  PLT 126* 141*  MCV 91.0 92.3  MCH 30.0 30.7  MCHC 33.0 33.2  RDW 14.1 14.0  LYMPHSABS -- --  MONOABS -- --  EOSABS -- --  BASOSABS -- --  BANDABS -- --    Chemistries   Lab 08/24/11 0648 08/23/11 1333  NA 139 141  K 3.2* 4.0  CL 106 104  CO2 22 24  GLUCOSE 98 106*  BUN 22 31*  CREATININE 1.51* 1.74*  CALCIUM 8.8 9.7  MG -- --    CBG: No results found for this basename: GLUCAP:5 in the last 168 hours  GFR Estimated Creatinine Clearance: 43.4 ml/min (by C-G formula based on Cr of 1.51).  Coagulation profile No results found for this basename: INR:5,PROTIME:5 in the last 168 hours  Cardiac Enzymes No results found for this  basename: CK:3,CKMB:3,TROPONINI:3,MYOGLOBIN:3 in the last 168 hours  No components found with this basename: POCBNP:3 No results found for this basename: DDIMER:2 in the last 72 hours No results found for this basename: HGBA1C:2 in the last 72 hours No results found for this basename: CHOL:2,HDL:2,LDLCALC:2,TRIG:2,CHOLHDL:2,LDLDIRECT:2 in the last 72 hours No results found for this basename: TSH,T4TOTAL,FREET3,T3FREE,THYROIDAB in the last 72 hours No results found for this basename: VITAMINB12:2,FOLATE:2,FERRITIN:2,TIBC:2,IRON:2,RETICCTPCT:2 in the last 72 hours No results found for this basename: LIPASE:2,AMYLASE:2 in the last 72 hours  Urine Studies No results found for this basename: UACOL:2,UAPR:2,USPG:2,UPH:2,UTP:2,UGL:2,UKET:2,UBIL:2,UHGB:2,UNIT:2,UROB:2,ULEU:2,UEPI:2,UWBC:2,URBC:2,UBAC:2,CAST:2,CRYS:2,UCOM:2,BILUA:2 in the last 72 hours  MICROBIOLOGY: No results found for this or any previous visit (from the past 240 hour(s)).  RADIOLOGY STUDIES/RESULTS: Dg Chest 2 View  08/23/2011  *RADIOLOGY REPORT*  Clinical Data: 76 year old male with cough, obstructive pulmonary disease.  Question CHF.  CHEST - 2 VIEW  Comparison: 01/19/2008 and earlier.  Findings: Chronic large lung volumes.  Cardiac size is stable at the upper limits of normal.  Chronic tortuous thoracic aorta. Other mediastinal contours are within normal limits.  Visualized tracheal air column is within normal limits.  No pneumothorax, pulmonary edema, pleural effusion or  confluent pulmonary opacity.  There is chronic apical scarring.  Asymmetry in the left lung apex appears increased from prior exams, but significance is unclear. No acute osseous abnormality identified.  IMPRESSION: 1.  No acute pneumonia or evidence of congestive heart failure identified. 2.  Chronic lung disease.  Asymmetric opacity in the left apex has increased since 2009.  This may be related to scarring, but recommend follow-up chest CT (IV contrast preferred)  to characterize further.  These results [name] be called to the ordering clinician or representative by the Radiologist Assistant, and communication documented in the PACS Dashboard.  Original Report Authenticated By: Harley Hallmark, M.D.   Ct Knee Left Wo Contrast  08/24/2011  *RADIOLOGY REPORT*  Clinical Data: Pain and swelling.  Fracture on prior MRI.  CT OF THE LEFT KNEE WITHOUT CONTRAST  Technique:  Multidetector CT imaging was performed according to the standard protocol. Multiplanar CT image reconstructions were also generated.  Comparison: Plain films 08/24/2011 and 11/06/1978 and MRI of the left lower extremity 08/23/2011.  Findings: As seen on the patient's MRI, there is a fracture of the medial tibial plateau.  The fracture involves the posterior aspect of the tibial plateau. The fracture fragment measures 3.1 cm transverse by 2.3 cm A P at the articular surface.  The fracture exits the metaphysis 3 cm below the articular surface.  There is minimal step off of 1-2 mm at the articular surface.  No other fracture is identified.  The patient has tricompartmental osteoarthritis which appears severe.  Chondrocalcinosis is present and appears worst in the lateral meniscus.  Calcific tendinopathy of the popliteus tendon is identified.  There is a small to moderate joint effusion.  IMPRESSION:  1.  Fracture of the posterior medial tibial plateau with minimal step off at the articular surface of 1-2 mm.  Associated joint effusion noted. 2.  Advanced tricompartmental degenerative disease. 3.  Chondrocalcinosis.  Original Report Authenticated By: Bernadene Bell. Maricela Curet, M.D.   Mr Tibia Fibula Left Wo Contrast  08/23/2011  *RADIOLOGY REPORT*  Clinical Data: Left leg pain and swelling.  MRI LEFT LOWER EXTREMITY WITHOUT CONTRAST  Technique:  Multiplanar, multisequence MR imaging of the left lower extremity was performed.  No intravenous contrast was administered.  Comparison:   None  Findings:  There is a medial tibial  plateau fracture on the left. No significant depression or displacement.  A CT examination of the knee may be helpful for further evaluation. There is an associated joint effusion.  There are significant tricompartmental degenerative changes involving both knees.  The tibial and fibular shafts are intact.  No marrow edema, bone contusion or fracture.  There is diffuse soft tissue swelling/edema and fluid in the subcutaneous fat involving entire left lower extremity.  Findings could be due to cellulitis.  No discrete/focal abscess is identified.  No findings for myofasciitis, osteomyelitis or septic arthritis.  IMPRESSION:  1.  Medial tibial plateau fracture.  CT examination of the knee would be helpful for further evaluation. 2.  Diffuse subcutaneous soft tissue swelling/edema suggesting cellulitis without focal abscess or findings for myofasciitis, septic arthritis or osteomyelitis.  Original Report Authenticated By: P. Loralie Champagne, M.D.   Dg Knee Complete 4 Views Left  08/24/2011  *RADIOLOGY REPORT*  Clinical Data: Tibial fracture.  LEFT KNEE - COMPLETE 4+ VIEW  Comparison: 08/23/2011  Findings: No joint effusion.  Severe tricompartment osteoarthritis noted.  Slightly depressed medial tibial plateau fracture is identified. This is better illustrated on recent MRI.  IMPRESSION:  1.  Medial plateau fracture which is better seen on MRI from 08/23/2011. 2.  Severe tricompartment osteoarthritis.  Original Report Authenticated By: Rosealee Albee, M.D.    MEDICATIONS: Scheduled Meds:   . amLODipine  10 mg Oral Daily  . ampicillin-sulbactam (UNASYN) IV  3 g Intravenous Q8H  . aspirin  81 mg Oral Daily  . carvedilol  6.25 mg Oral BID WC  . enoxaparin  40 mg Subcutaneous Q24H  . furosemide  20 mg Oral Daily  . hydrALAZINE  50 mg Oral TID  . pneumococcal 23 valent vaccine  0.5 mL Intramuscular Tomorrow-1000  . potassium chloride  40 mEq Oral Once  . senna  1 tablet Oral BID  . simvastatin  5 mg Oral  QHS  . sodium chloride  3 mL Intravenous Q12H  . sodium chloride  3 mL Intravenous Q12H  . tiotropium  18 mcg Inhalation Daily  . vancomycin  1,000 mg Intravenous Q24H  . DISCONTD: aspirin  81 mg Oral Daily   Continuous Infusions:  PRN Meds:.sodium chloride, acetaminophen, acetaminophen, albuterol, alum & mag hydroxide-simeth, HYDROcodone-acetaminophen, morphine injection, ondansetron (ZOFRAN) IV, ondansetron, sodium chloride, zolpidem  Antibiotics: Anti-infectives     Start     Dose/Rate Route Frequency Ordered Stop   08/23/11 2000   vancomycin (VANCOCIN) IVPB 1000 mg/200 mL premix        1,000 mg 200 mL/hr over 60 Minutes Intravenous Every 24 hours 08/23/11 1649     08/23/11 1800   Ampicillin-Sulbactam (UNASYN) 3 g in sodium chloride 0.9 % 100 mL IVPB        3 g 100 mL/hr over 60 Minutes Intravenous Every 8 hours 08/23/11 1649            Assessment/Plan: Principal Problem:  *Cellulitis-LLE -MRI done 6/6-no abscess, but "incidental" Tibial plateau fracture -Doppler's-no obvious DVT-unlikely to be hyperacute DVT-given 7 day history of left leg swelling -continue with empiric Vanco/Unasyn -elevate left leg  Medial Tibial Plateau Fracture -Spoke with Dr Charlann Boxer, who requested Plain Xrays and CT   -await ortho eval -patient denies recent trauma or fall  RENAL INSUFFICIENCY  -At baseline  -Chronic kidney disease stage III at baseline   HYPERLIPIDEMIA  -Continue with statin   .Essential hypertension, benign -BP controlled -continue with Hydralazine, Lasix, Coreg  CORONARY ATHEROSCLEROSIS NATIVE CORONARY ARTERY  -Apparently has a history of nonobstructive coronary artery disease  -Continue with aspirin-no apparent abscess   Cardiomyopathy  -Clinically euvolemic  -Last echocardiogram done in 2012 shows a preserved ejection fraction  Disposition: Remain inpatient  DVT Prophylaxis: Prophylactic Lovenox  Code Status: Full Code  Jeoffrey Massed, MD  [facility] Pager [phone]  If 7PM-7AM, please contact night-coverage [email or web address] Password [email or web address] 08/24/2011, 10:00 AM   LOS: 1 day

## 2011-08-24 NOTE — Consult Note (Signed)
Reason for Consult: Evaluation of tibial plateau fracture found on imaging.   Pt was seen with and evaluated by Dr. Charlann Boxer.  [name] Rice is an 76 y.o. male.  HPI:  Patient is a 76 year old Caucasian male with a past medical history of nonischemic cardiomyopathy, history of nonobstructive coronary artery disease, stage III chronic kidney disease, hypertension was referred to the hospital as a direct admission for left lower leg swelling and pain. The patient started having burning sensation in his left lower extremity, this was associated with worsening erythema and worsening swelling. He denies any fever. He then went to his primary care practitioner today who then referred him to [facility] as a direct admission. At this time he feels that the pain and swelling are resolving. Denies any knee pain.   Past Medical History  Diagnosis Date  . NICM (nonischemic cardiomyopathy)     LVEF 55% 3/11  . Coronary atherosclerosis of native coronary artery     Nonobstructive  . Essential hypertension, benign   . COPD (chronic obstructive pulmonary disease)   . Pulmonary hypertension   . Mitral regurgitation     Moderate  . Ventricular dysfunction, right   . Tricuspid regurgitation   . Lower extremity edema   . GERD (gastroesophageal reflux disease)   . DJD (degenerative joint disease)   . Bradycardia   . Paroxysmal atrial fibrillation   . CKD (chronic kidney disease), stage III   . Dialysis patient     "went 3 days/wk for about 3 months; don't remember when I went"    Past Surgical History  Procedure Date  . Total hip arthroplasty ~ 2005    left  . Dialysis fistula creation     left forearm  . Cataract extraction w/ intraocular lens implant     right    Family History  Problem Relation Age of Onset  . Cancer Brother     Social History:  reports that he quit smoking about 10 years ago. His smoking use included Cigarettes. He has a 40 pack-year smoking history. He has never  used smokeless tobacco. He reports that he drinks alcohol. He reports that he does not use illicit drugs.  Allergies: No Known Allergies  Medications: I have reviewed the patient's current medications.   Ct Knee Left Wo Contrast  08/24/2011  *RADIOLOGY REPORT*  Clinical Data: Pain and swelling.  Fracture on prior MRI.  CT OF THE LEFT KNEE WITHOUT CONTRAST  Technique:  Multidetector CT imaging was performed according to the standard protocol. Multiplanar CT image reconstructions were also generated.  Comparison: Plain films 08/24/2011 and 11/06/1978 and MRI of the left lower extremity 08/23/2011.  Findings: As seen on the patient's MRI, there is a fracture of the medial tibial plateau.  The fracture involves the posterior aspect of the tibial plateau. The fracture fragment measures 3.1 cm transverse by 2.3 cm A P at the articular surface.  The fracture exits the metaphysis 3 cm below the articular surface.  There is minimal step off of 1-2 mm at the articular surface.  No other fracture is identified.  The patient has tricompartmental osteoarthritis which appears severe.  Chondrocalcinosis is present and appears worst in the lateral meniscus.  Calcific tendinopathy of the popliteus tendon is identified.  There is a small to moderate joint effusion.  IMPRESSION:  1.  Fracture of the posterior medial tibial plateau with minimal step off at the articular surface of 1-2 mm.  Associated joint effusion noted. 2.  Advanced tricompartmental degenerative disease. 3.  Chondrocalcinosis.  Original Report Authenticated By: Bernadene Bell. Maricela Curet, M.D.   Mr Tibia Fibula Left Wo Contrast  08/23/2011  *RADIOLOGY REPORT*  Clinical Data: Left leg pain and swelling.  MRI LEFT LOWER EXTREMITY WITHOUT CONTRAST  Technique:  Multiplanar, multisequence MR imaging of the left lower extremity was performed.  No intravenous contrast was administered.  Comparison:   None  Findings:  There is a medial tibial plateau fracture on the left.  No significant depression or displacement.  A CT examination of the knee may be helpful for further evaluation. There is an associated joint effusion.  There are significant tricompartmental degenerative changes involving both knees.  The tibial and fibular shafts are intact.  No marrow edema, bone contusion or fracture.  There is diffuse soft tissue swelling/edema and fluid in the subcutaneous fat involving entire left lower extremity.  Findings could be due to cellulitis.  No discrete/focal abscess is identified.  No findings for myofasciitis, osteomyelitis or septic arthritis.  IMPRESSION:  1.  Medial tibial plateau fracture.  CT examination of the knee would be helpful for further evaluation. 2.  Diffuse subcutaneous soft tissue swelling/edema suggesting cellulitis without focal abscess or findings for myofasciitis, septic arthritis or osteomyelitis.  Original Report Authenticated By: P. Loralie Champagne, M.D.   Dg Knee Complete 4 Views Left  08/24/2011  *RADIOLOGY REPORT*  Clinical Data: Tibial fracture.  LEFT KNEE - COMPLETE 4+ VIEW  Comparison: 08/23/2011  Findings: No joint effusion.  Severe tricompartment osteoarthritis noted.  Slightly depressed medial tibial plateau fracture is identified. This is better illustrated on recent MRI.  IMPRESSION:  1.  Medial plateau fracture which is better seen on MRI from 08/23/2011. 2.  Severe tricompartment osteoarthritis.  Original Report Authenticated By: Rosealee Albee, M.D.    Review of Systems  Constitutional: Negative for fever, chills and malaise/fatigue.  Cardiovascular: Positive for leg swelling (left lower leg).  Musculoskeletal: Negative for joint pain.   Blood pressure 142/63, pulse 58, temperature 97.6 F (36.4 C), temperature source Oral, resp. rate 18, height 5\' 7"  (1.702 m), weight 175 lb 3.2 oz (79.47 kg), SpO2 95.00%. Physical Exam  Musculoskeletal:       Left knee: He exhibits no swelling, no effusion and no bony tenderness. no tenderness  found.  No abnormal temperature of the left knee Cellulitis of the left lower leg which is marked.  Discoloration and swelling of the left lower leg  Assessment/Plan: Chronic left tibial plateau fracture, not clinically significant Cellulitis of the left lower leg, marked. Patient feels that it is getting better.  Plan Orthopaedically he is good, nothing more to do about the tibial plateau fracture at this time. Cellulitis doesn't extend to the knee and there is no pain in the knee Continue treatment of the cellulitis   Follow up with Dr. Durene Romans as needed at [facility] information:   [facility] [address] [place] [address] [phone]             Gerrit Halls 08/24/2011, 8:40 PM

## 2011-08-24 NOTE — Plan of Care (Signed)
Problem: Phase I Progression Outcomes Goal: Initial discharge plan identified Outcome: Completed/Met Date Met:  08/24/11 return home when medically cleared

## 2011-08-25 DIAGNOSIS — L03119 Cellulitis of unspecified part of limb: Secondary | ICD-10-CM

## 2011-08-25 DIAGNOSIS — I10 Essential (primary) hypertension: Secondary | ICD-10-CM

## 2011-08-25 DIAGNOSIS — N189 Chronic kidney disease, unspecified: Secondary | ICD-10-CM

## 2011-08-25 DIAGNOSIS — L02419 Cutaneous abscess of limb, unspecified: Secondary | ICD-10-CM

## 2011-08-25 DIAGNOSIS — E782 Mixed hyperlipidemia: Secondary | ICD-10-CM

## 2011-08-25 LAB — BASIC METABOLIC PANEL
BUN: 17 mg/dL (ref 6–23)
CO2: 21 mEq/L (ref 19–32)
Chloride: 102 mEq/L (ref 96–112)
Creatinine, Ser: 1.55 mg/dL — ABNORMAL HIGH (ref 0.50–1.35)
Glucose, Bld: 100 mg/dL — ABNORMAL HIGH (ref 70–99)

## 2011-08-25 LAB — CBC
HCT: 30.1 % — ABNORMAL LOW (ref 39.0–52.0)
MCV: 91.8 fL (ref 78.0–100.0)
RBC: 3.28 MIL/uL — ABNORMAL LOW (ref 4.22–5.81)
WBC: 6.7 10*3/uL (ref 4.0–10.5)

## 2011-08-25 NOTE — Evaluation (Signed)
Physical Therapy Evaluation Patient Details Name: [name] Rice MRN: [id number] DOB: January 16, 1936 Today's Date: 08/25/2011 Time: 4098-1191 PT Time Calculation (min): 22 min  PT Assessment / Plan / Recommendation Clinical Impression  Patient is a 76 yo male admitted with LLE cellulitis.  Also found chronic tibial plateau fx on lt - no intervention planned.  Patient independent pta.  Able to ambulate with RW safely.  Feel patient could return home safely.  Will continue therapy for strengthening and mobility training.    PT Assessment  Patient needs continued PT services    Follow Up Recommendations  Home health PT (for home safety evaluation and continued therapy)    Barriers to Discharge        lEquipment Recommendations   (4-wheeled RW with seat)    Recommendations for Other Services     Frequency Min 3X/week    Precautions / Restrictions Precautions Precautions: Fall Restrictions Weight Bearing Restrictions: No       Mobility  Transfers Transfers: Sit to Stand;Stand to Sit Sit to Stand: 4: Min guard;With upper extremity assist;With armrests;From chair/3-in-1 Stand to Sit: 4: Min guard;With upper extremity assist;With armrests;To chair/3-in-1 Details for Transfer Assistance: Cues for safe hand placement. Ambulation/Gait Ambulation/Gait Assistance: 4: Min guard Ambulation Distance (Feet): 112 Feet Assistive device: Rolling walker Gait Pattern: Step-through pattern;Decreased stride length;Trunk flexed;Wide base of support General Gait Details: Trunk flexed forward - verbal cues to stand tall.  Verbal cues to stay closer to RW.    Exercises     PT Diagnosis: Difficulty walking;Abnormality of gait;Acute pain;Generalized weakness  PT Problem List: Decreased strength;Decreased mobility;Pain PT Treatment Interventions: DME instruction;Gait training;Functional mobility training;Therapeutic activities;Therapeutic exercise;Patient/family education   PT Goals Acute Rehab PT  Goals PT Goal Formulation: With patient Time For Goal Achievement: 09/01/11 Potential to Achieve Goals: Good Pt will go Supine/Side to Sit: Independently;with HOB 0 degrees PT Goal: Supine/Side to Sit - Progress: Goal set today Pt will go Sit to Supine/Side: Independently;with HOB 0 degrees PT Goal: Sit to Supine/Side - Progress: Goal set today Pt will go Sit to Stand: with modified independence;with upper extremity assist PT Goal: Sit to Stand - Progress: Goal set today Pt will go Stand to Sit: with modified independence;with upper extremity assist PT Goal: Stand to Sit - Progress: Goal set today Pt will Ambulate: >150 feet;with modified independence;with rolling walker PT Goal: Ambulate - Progress: Goal set today  Visit Information  Last PT Received On: 08/25/11 Assistance Needed: +1    Subjective Data  Subjective: "My leg is burning." Patient Stated Goal: To return home   Prior Functioning  Home Living Lives With: Alone Available Help at Discharge: Family Type of Home: Apartment Home Access: Level entry Home Layout: One level Bathroom Shower/Tub: Engineer, manufacturing systems: Standard Bathroom Accessibility: Yes How Accessible: Accessible via walker Home Adaptive Equipment: Straight cane;Walker - rolling;Shower chair with back Prior Function Level of Independence: Independent with assistive device(s) Able to Take Stairs?: No Driving: No (Cousin drives for him) Vocation: Retired Musician: No difficulties    Cognition  Overall Cognitive Status: Appears within functional limits for tasks assessed/performed Arousal/Alertness: Awake/alert Orientation Level: Appears intact for tasks assessed Behavior During Session: [facility] for tasks performed    Extremity/Trunk Assessment Right Upper Extremity Assessment RUE ROM/Strength/Tone: [facility] for tasks assessed Left Upper Extremity Assessment LUE ROM/Strength/Tone: WFL for tasks assessed Right Lower Extremity  Assessment RLE ROM/Strength/Tone: [facility] for tasks assessed Left Lower Extremity Assessment LLE ROM/Strength/Tone: Unable to fully assess;Due to pain   Balance  End of Session PT - End of Session Equipment Utilized During Treatment: Gait belt Activity Tolerance: Patient tolerated treatment well Patient left: in chair;with call bell/phone within reach Nurse Communication: Mobility status   Vena Austria 08/25/2011, 4:44 PM Durenda Hurt. Renaldo Fiddler, [facility] Acute Rehab Services Pager [phone]

## 2011-08-25 NOTE — Progress Notes (Signed)
PATIENT DETAILS Name: [name] Rice Age: 76 y.o. Sex: male Date of Birth: [birth year] Admit Date: 08/23/2011 ZOX:WRUE,AVWU, MD, MD  Subjective: -erythema/swelling of the left leg continue to get better  Objective: Vital signs in last 24 hours: Filed Vitals:   08/24/11 1419 08/24/11 2143 08/25/11 0529 08/25/11 0915  BP: 142/63 126/61 126/66   Pulse: 58  55   Temp: 97.6 F (36.4 C) 98.1 F (36.7 C) 98.1 F (36.7 C)   TempSrc: Oral Oral Oral   Resp: 18 20 22    Height:      Weight:   78.1 kg (172 lb 2.9 oz)   SpO2: 95% 93% 94% 97%    Weight change: -0.9 kg (-1 lb 15.7 oz)  Body mass index is 26.97 kg/(m^2).  Intake/Output from previous day:  Intake/Output Summary (Last 24 hours) at 08/25/11 1210 Last data filed at 08/25/11 1124  Gross per 24 hour  Intake   1343 ml  Output   2300 ml  Net   -957 ml    PHYSICAL EXAM: Gen Exam: Awake and alert with clear speech.  Neck: Supple, No JVD.   Chest: B/L Clear.   CVS: S1 S2 Regular, no murmurs.  Abdomen: soft, BS +, non tender, non distended.  Extremities:LLE-slight decrease in swelling, decrease in erythema from marked area, erythema continues to fade as well Neurologic: Non Focal.   Skin: No Rash.   Wounds: N/A.    CONSULTS:  orthopedic surgery-Dr Charlann Boxer  LAB RESULTS: CBC  Lab 08/25/11 0650 08/24/11 0648 08/23/11 1333  WBC 6.7 7.2 8.2  HGB 10.0* 9.3* 10.7*  HCT 30.1* 28.2* 32.2*  PLT 134* 126* 141*  MCV 91.8 91.0 92.3  MCH 30.5 30.0 30.7  MCHC 33.2 33.0 33.2  RDW 13.9 14.1 14.0  LYMPHSABS -- -- --  MONOABS -- -- --  EOSABS -- -- --  BASOSABS -- -- --  BANDABS -- -- --    Chemistries   Lab 08/25/11 0650 08/24/11 0648 08/23/11 1333  NA 136 139 141  K 3.7 3.2* 4.0  CL 102 106 104  CO2 21 22 24   GLUCOSE 100* 98 106*  BUN 17 22 31*  CREATININE 1.55* 1.51* 1.74*  CALCIUM 8.8 8.8 9.7  MG -- -- --    CBG: No results found for this basename: GLUCAP:5 in the last 168 hours  GFR Estimated Creatinine  Clearance: 39.1 ml/min (by C-G formula based on Cr of 1.55).  Coagulation profile No results found for this basename: INR:5,PROTIME:5 in the last 168 hours  Cardiac Enzymes No results found for this basename: CK:3,CKMB:3,TROPONINI:3,MYOGLOBIN:3 in the last 168 hours  No components found with this basename: POCBNP:3 No results found for this basename: DDIMER:2 in the last 72 hours No results found for this basename: HGBA1C:2 in the last 72 hours No results found for this basename: CHOL:2,HDL:2,LDLCALC:2,TRIG:2,CHOLHDL:2,LDLDIRECT:2 in the last 72 hours No results found for this basename: TSH,T4TOTAL,FREET3,T3FREE,THYROIDAB in the last 72 hours No results found for this basename: VITAMINB12:2,FOLATE:2,FERRITIN:2,TIBC:2,IRON:2,RETICCTPCT:2 in the last 72 hours No results found for this basename: LIPASE:2,AMYLASE:2 in the last 72 hours  Urine Studies No results found for this basename: UACOL:2,UAPR:2,USPG:2,UPH:2,UTP:2,UGL:2,UKET:2,UBIL:2,UHGB:2,UNIT:2,UROB:2,ULEU:2,UEPI:2,UWBC:2,URBC:2,UBAC:2,CAST:2,CRYS:2,UCOM:2,BILUA:2 in the last 72 hours  MICROBIOLOGY: No results found for this or any previous visit (from the past 240 hour(s)).  RADIOLOGY STUDIES/RESULTS: Dg Chest 2 View  08/23/2011  *RADIOLOGY REPORT*  Clinical Data: 76 year old male with cough, obstructive pulmonary disease.  Question CHF.  CHEST - 2 VIEW  Comparison: 01/19/2008 and earlier.  Findings: Chronic large lung  volumes.  Cardiac size is stable at the upper limits of normal.  Chronic tortuous thoracic aorta. Other mediastinal contours are within normal limits.  Visualized tracheal air column is within normal limits.  No pneumothorax, pulmonary edema, pleural effusion or confluent pulmonary opacity.  There is chronic apical scarring.  Asymmetry in the left lung apex appears increased from prior exams, but significance is unclear. No acute osseous abnormality identified.  IMPRESSION: 1.  No acute pneumonia or evidence of congestive  heart failure identified. 2.  Chronic lung disease.  Asymmetric opacity in the left apex has increased since 2009.  This may be related to scarring, but recommend follow-up chest CT (IV contrast preferred) to characterize further.  These results will be called to the ordering clinician or representative by the Radiologist Assistant, and communication documented in the PACS Dashboard.  Original Report Authenticated By: Harley Hallmark, M.D.   Ct Knee Left Wo Contrast  08/24/2011  *RADIOLOGY REPORT*  Clinical Data: Pain and swelling.  Fracture on prior MRI.  CT OF THE LEFT KNEE WITHOUT CONTRAST  Technique:  Multidetector CT imaging was performed according to the standard protocol. Multiplanar CT image reconstructions were also generated.  Comparison: Plain films 08/24/2011 and 11/06/1978 and MRI of the left lower extremity 08/23/2011.  Findings: As seen on the patient's MRI, there is a fracture of the medial tibial plateau.  The fracture involves the posterior aspect of the tibial plateau. The fracture fragment measures 3.1 cm transverse by 2.3 cm A P at the articular surface.  The fracture exits the metaphysis 3 cm below the articular surface.  There is minimal step off of 1-2 mm at the articular surface.  No other fracture is identified.  The patient has tricompartmental osteoarthritis which appears severe.  Chondrocalcinosis is present and appears worst in the lateral meniscus.  Calcific tendinopathy of the popliteus tendon is identified.  There is a small to moderate joint effusion.  IMPRESSION:  1.  Fracture of the posterior medial tibial plateau with minimal step off at the articular surface of 1-2 mm.  Associated joint effusion noted. 2.  Advanced tricompartmental degenerative disease. 3.  Chondrocalcinosis.  Original Report Authenticated By: Bernadene Bell. Maricela Curet, M.D.   Mr Tibia Fibula Left Wo Contrast  08/23/2011  *RADIOLOGY REPORT*  Clinical Data: Left leg pain and swelling.  MRI LEFT LOWER EXTREMITY  WITHOUT CONTRAST  Technique:  Multiplanar, multisequence MR imaging of the left lower extremity was performed.  No intravenous contrast was administered.  Comparison:   None  Findings:  There is a medial tibial plateau fracture on the left. No significant depression or displacement.  A CT examination of the knee may be helpful for further evaluation. There is an associated joint effusion.  There are significant tricompartmental degenerative changes involving both knees.  The tibial and fibular shafts are intact.  No marrow edema, bone contusion or fracture.  There is diffuse soft tissue swelling/edema and fluid in the subcutaneous fat involving entire left lower extremity.  Findings could be due to cellulitis.  No discrete/focal abscess is identified.  No findings for myofasciitis, osteomyelitis or septic arthritis.  IMPRESSION:  1.  Medial tibial plateau fracture.  CT examination of the knee would be helpful for further evaluation. 2.  Diffuse subcutaneous soft tissue swelling/edema suggesting cellulitis without focal abscess or findings for myofasciitis, septic arthritis or osteomyelitis.  Original Report Authenticated By: P. Loralie Champagne, M.D.   Dg Knee Complete 4 Views Left  08/24/2011  *RADIOLOGY REPORT*  Clinical Data:  Tibial fracture.  LEFT KNEE - COMPLETE 4+ VIEW  Comparison: 08/23/2011  Findings: No joint effusion.  Severe tricompartment osteoarthritis noted.  Slightly depressed medial tibial plateau fracture is identified. This is better illustrated on recent MRI.  IMPRESSION:  1.  Medial plateau fracture which is better seen on MRI from 08/23/2011. 2.  Severe tricompartment osteoarthritis.  Original Report Authenticated By: Rosealee Albee, M.D.    MEDICATIONS: Scheduled Meds:    . amLODipine  10 mg Oral Daily  . ampicillin-sulbactam (UNASYN) IV  3 g Intravenous Q8H  . aspirin  81 mg Oral Daily  . carvedilol  6.25 mg Oral BID WC  . enoxaparin  40 mg Subcutaneous Q24H  . furosemide  20 mg  Oral Daily  . hydrALAZINE  50 mg Oral TID  . pneumococcal 23 valent vaccine  0.5 mL Intramuscular Tomorrow-1000  . senna  1 tablet Oral BID  . simvastatin  5 mg Oral QHS  . sodium chloride  3 mL Intravenous Q12H  . sodium chloride  3 mL Intravenous Q12H  . tiotropium  18 mcg Inhalation Daily  . vancomycin  1,000 mg Intravenous Q24H   Continuous Infusions:  PRN Meds:.sodium chloride, acetaminophen, acetaminophen, albuterol, alum & mag hydroxide-simeth, HYDROcodone-acetaminophen, morphine injection, ondansetron (ZOFRAN) IV, ondansetron, sodium chloride, zolpidem  Antibiotics: Anti-infectives     Start     Dose/Rate Route Frequency Ordered Stop   08/23/11 2000   vancomycin (VANCOCIN) IVPB 1000 mg/200 mL premix        1,000 mg 200 mL/hr over 60 Minutes Intravenous Every 24 hours 08/23/11 1649     08/23/11 1800   Ampicillin-Sulbactam (UNASYN) 3 g in sodium chloride 0.9 % 100 mL IVPB        3 g 100 mL/hr over 60 Minutes Intravenous Every 8 hours 08/23/11 1649            Assessment/Plan: Principal Problem:  *Cellulitis-LLE -slowly getting better -MRI done 6/6-no abscess, but "incidental" Tibial plateau fracture -Doppler's-no obvious DVT-unlikely to be hyperacute DVT-given 7 day history of left leg swelling -continue with empiric Vanco/Unasyn -elevate left leg  Medial Tibial Plateau Fracture -seen by ortho-no further tx required -patient denies recent trauma or fall  RENAL INSUFFICIENCY  -At baseline  -Chronic kidney disease stage III at baseline   HYPERLIPIDEMIA  -Continue with statin   .Essential hypertension, benign -BP controlled -continue with Hydralazine, Lasix, Coreg  CORONARY ATHEROSCLEROSIS NATIVE CORONARY ARTERY  -Apparently has a history of nonobstructive coronary artery disease  -Continue with aspirin-no apparent abscess   Cardiomyopathy  -Clinically euvolemic  -Last echocardiogram done in 2012 shows a preserved ejection fraction  Disposition: Remain  inpatient  DVT Prophylaxis: Prophylactic Lovenox  Code Status: Full Code  Jeoffrey Massed, MD  [facility] Pager [phone]  If 7PM-7AM, please contact night-coverage [email or web address] Password [email or web address] 08/25/2011, 12:10 PM   LOS: 2 days

## 2011-08-26 DIAGNOSIS — L03119 Cellulitis of unspecified part of limb: Secondary | ICD-10-CM

## 2011-08-26 DIAGNOSIS — N189 Chronic kidney disease, unspecified: Secondary | ICD-10-CM

## 2011-08-26 DIAGNOSIS — L02419 Cutaneous abscess of limb, unspecified: Secondary | ICD-10-CM

## 2011-08-26 DIAGNOSIS — E782 Mixed hyperlipidemia: Secondary | ICD-10-CM

## 2011-08-26 DIAGNOSIS — I10 Essential (primary) hypertension: Secondary | ICD-10-CM

## 2011-08-26 NOTE — Progress Notes (Signed)
ANTIBIOTIC CONSULT NOTE - Follow up  Pharmacy Consult for Unasyn/Vancomycin Indication: cellulitis  Assessment: 76yom admitted for LLE cellulitis. No fevers have been noted and wbc is decreasing to wnl. Renal function remains stable with a scr of 1.5, no uop documented yesterday. No culture data was obtained. Marked improvement in erythema and swelling noted.   Goal of Therapy:  Vancomycin trough level 10-15 mcg/ml  Plan:  1.Continue Unasyn 3Gm q8 2.Continue Vancomycin 1GM q24 3.Will plan on checking vancomycin trough next 1-2 days if IV therapy is to continue.  Vital Signs: Temp: 97.9 F (36.6 C) (06/09 0426) Temp src: Oral (06/09 0426) BP: 130/63 mmHg (06/09 0426) Pulse Rate: 74  (06/09 0426)  Labs:  Basename 08/25/11 0650 08/24/11 0648 08/23/11 1333  WBC 6.7 7.2 8.2  HGB 10.0* 9.3* 10.7*  PLT 134* 126* 141*  LABCREA -- -- --  CREATININE 1.55* 1.51* 1.74*   Microbiology: No results found for this or any previous visit (from the past 720 hour(s)).  Medical History: Past Medical History  Diagnosis Date  . NICM (nonischemic cardiomyopathy)     LVEF 55% 3/11  . Coronary atherosclerosis of native coronary artery     Nonobstructive  . Essential hypertension, benign   . COPD (chronic obstructive pulmonary disease)   . Pulmonary hypertension   . Mitral regurgitation     Moderate  . Ventricular dysfunction, right   . Tricuspid regurgitation   . Lower extremity edema   . GERD (gastroesophageal reflux disease)   . DJD (degenerative joint disease)   . Bradycardia   . Paroxysmal atrial fibrillation   . CKD (chronic kidney disease), stage III   . Dialysis patient     "went 3 days/wk for about 3 months; don't remember when I went"     Severiano Gilbert 08/26/2011,11:37 AM

## 2011-08-26 NOTE — Progress Notes (Signed)
PATIENT DETAILS Name: [name] Rice Age: 76 y.o. Sex: male Date of Birth: [birth year] Admit Date: 08/23/2011 ZOX:WRUE,AVWU, MD, MD  Subjective: -erythema/swelling of the left leg continue to get better-erythema now fading significantly within the marked area  Objective: Vital signs in last 24 hours: Filed Vitals:   08/25/11 0915 08/25/11 1400 08/25/11 2116 08/26/11 0426  BP:  111/60 114/60 130/63  Pulse:  61 57 74  Temp:  97.9 F (36.6 C) 97.8 F (36.6 C) 97.9 F (36.6 C)  TempSrc:  Oral Oral Oral  Resp:  20 16 18   Height:      Weight:    80 kg (176 lb 5.9 oz)  SpO2: 97% 94% 91% 91%    Weight change: 1.9 kg (4 lb 3 oz)  Body mass index is 27.62 kg/(m^2).  Intake/Output from previous day:  Intake/Output Summary (Last 24 hours) at 08/26/11 1036 Last data filed at 08/25/11 1800  Gross per 24 hour  Intake   1043 ml  Output      0 ml  Net   1043 ml    PHYSICAL EXAM: Gen Exam: Awake and alert with clear speech.  Neck: Supple, No JVD.   Chest: B/L Clear.   CVS: S1 S2 Regular, no murmurs.  Abdomen: soft, BS +, non tender, non distended.  Extremities:LLE-slight decrease in swelling, decrease in erythema from marked area, erythema continues to fade as well-and significantly within the marked area Neurologic: Non Focal.   Skin: No Rash.   Wounds: N/A.    CONSULTS:  orthopedic surgery-Dr Charlann Boxer  LAB RESULTS: CBC  Lab 08/25/11 0650 08/24/11 0648 08/23/11 1333  WBC 6.7 7.2 8.2  HGB 10.0* 9.3* 10.7*  HCT 30.1* 28.2* 32.2*  PLT 134* 126* 141*  MCV 91.8 91.0 92.3  MCH 30.5 30.0 30.7  MCHC 33.2 33.0 33.2  RDW 13.9 14.1 14.0  LYMPHSABS -- -- --  MONOABS -- -- --  EOSABS -- -- --  BASOSABS -- -- --  BANDABS -- -- --    Chemistries   Lab 08/25/11 0650 08/24/11 0648 08/23/11 1333  NA 136 139 141  K 3.7 3.2* 4.0  CL 102 106 104  CO2 21 22 24   GLUCOSE 100* 98 106*  BUN 17 22 31*  CREATININE 1.55* 1.51* 1.74*  CALCIUM 8.8 8.8 9.7  MG -- -- --    CBG: No  results found for this basename: GLUCAP:5 in the last 168 hours  GFR Estimated Creatinine Clearance: 42.4 ml/min (by C-G formula based on Cr of 1.55).  Coagulation profile No results found for this basename: INR:5,PROTIME:5 in the last 168 hours  Cardiac Enzymes No results found for this basename: CK:3,CKMB:3,TROPONINI:3,MYOGLOBIN:3 in the last 168 hours  No components found with this basename: POCBNP:3 No results found for this basename: DDIMER:2 in the last 72 hours No results found for this basename: HGBA1C:2 in the last 72 hours No results found for this basename: CHOL:2,HDL:2,LDLCALC:2,TRIG:2,CHOLHDL:2,LDLDIRECT:2 in the last 72 hours No results found for this basename: TSH,T4TOTAL,FREET3,T3FREE,THYROIDAB in the last 72 hours No results found for this basename: VITAMINB12:2,FOLATE:2,FERRITIN:2,TIBC:2,IRON:2,RETICCTPCT:2 in the last 72 hours No results found for this basename: LIPASE:2,AMYLASE:2 in the last 72 hours  Urine Studies No results found for this basename: UACOL:2,UAPR:2,USPG:2,UPH:2,UTP:2,UGL:2,UKET:2,UBIL:2,UHGB:2,UNIT:2,UROB:2,ULEU:2,UEPI:2,UWBC:2,URBC:2,UBAC:2,CAST:2,CRYS:2,UCOM:2,BILUA:2 in the last 72 hours  MICROBIOLOGY: No results found for this or any previous visit (from the past 240 hour(s)).  RADIOLOGY STUDIES/RESULTS: Dg Chest 2 View  08/23/2011  *RADIOLOGY REPORT*  Clinical Data: 76 year old male with cough, obstructive pulmonary disease.  Question CHF.  CHEST - 2 VIEW  Comparison: 01/19/2008 and earlier.  Findings: Chronic large lung volumes.  Cardiac size is stable at the upper limits of normal.  Chronic tortuous thoracic aorta. Other mediastinal contours are within normal limits.  Visualized tracheal air column is within normal limits.  No pneumothorax, pulmonary edema, pleural effusion or confluent pulmonary opacity.  There is chronic apical scarring.  Asymmetry in the left lung apex appears increased from prior exams, but significance is unclear. No acute  osseous abnormality identified.  IMPRESSION: 1.  No acute pneumonia or evidence of congestive heart failure identified. 2.  Chronic lung disease.  Asymmetric opacity in the left apex has increased since 2009.  This may be related to scarring, but recommend follow-up chest CT (IV contrast preferred) to characterize further.  These results will be called to the ordering clinician or representative by the Radiologist Assistant, and communication documented in the PACS Dashboard.  Original Report Authenticated By: Harley Hallmark, M.D.   Ct Knee Left Wo Contrast  08/24/2011  *RADIOLOGY REPORT*  Clinical Data: Pain and swelling.  Fracture on prior MRI.  CT OF THE LEFT KNEE WITHOUT CONTRAST  Technique:  Multidetector CT imaging was performed according to the standard protocol. Multiplanar CT image reconstructions were also generated.  Comparison: Plain films 08/24/2011 and 11/06/1978 and MRI of the left lower extremity 08/23/2011.  Findings: As seen on the patient's MRI, there is a fracture of the medial tibial plateau.  The fracture involves the posterior aspect of the tibial plateau. The fracture fragment measures 3.1 cm transverse by 2.3 cm A P at the articular surface.  The fracture exits the metaphysis 3 cm below the articular surface.  There is minimal step off of 1-2 mm at the articular surface.  No other fracture is identified.  The patient has tricompartmental osteoarthritis which appears severe.  Chondrocalcinosis is present and appears worst in the lateral meniscus.  Calcific tendinopathy of the popliteus tendon is identified.  There is a small to moderate joint effusion.  IMPRESSION:  1.  Fracture of the posterior medial tibial plateau with minimal step off at the articular surface of 1-2 mm.  Associated joint effusion noted. 2.  Advanced tricompartmental degenerative disease. 3.  Chondrocalcinosis.  Original Report Authenticated By: Bernadene Bell. Maricela Curet, M.D.   Mr Tibia Fibula Left Wo Contrast  08/23/2011   *RADIOLOGY REPORT*  Clinical Data: Left leg pain and swelling.  MRI LEFT LOWER EXTREMITY WITHOUT CONTRAST  Technique:  Multiplanar, multisequence MR imaging of the left lower extremity was performed.  No intravenous contrast was administered.  Comparison:   None  Findings:  There is a medial tibial plateau fracture on the left. No significant depression or displacement.  A CT examination of the knee may be helpful for further evaluation. There is an associated joint effusion.  There are significant tricompartmental degenerative changes involving both knees.  The tibial and fibular shafts are intact.  No marrow edema, bone contusion or fracture.  There is diffuse soft tissue swelling/edema and fluid in the subcutaneous fat involving entire left lower extremity.  Findings could be due to cellulitis.  No discrete/focal abscess is identified.  No findings for myofasciitis, osteomyelitis or septic arthritis.  IMPRESSION:  1.  Medial tibial plateau fracture.  CT examination of the knee would be helpful for further evaluation. 2.  Diffuse subcutaneous soft tissue swelling/edema suggesting cellulitis without focal abscess or findings for myofasciitis, septic arthritis or osteomyelitis.  Original Report Authenticated By: P. Loralie Champagne, M.D.  Dg Knee Complete 4 Views Left  08/24/2011  *RADIOLOGY REPORT*  Clinical Data: Tibial fracture.  LEFT KNEE - COMPLETE 4+ VIEW  Comparison: 08/23/2011  Findings: No joint effusion.  Severe tricompartment osteoarthritis noted.  Slightly depressed medial tibial plateau fracture is identified. This is better illustrated on recent MRI.  IMPRESSION:  1.  Medial plateau fracture which is better seen on MRI from 08/23/2011. 2.  Severe tricompartment osteoarthritis.  Original Report Authenticated By: Rosealee Albee, M.D.    MEDICATIONS: Scheduled Meds:    . amLODipine  10 mg Oral Daily  . ampicillin-sulbactam (UNASYN) IV  3 g Intravenous Q8H  . aspirin  81 mg Oral Daily  .  carvedilol  6.25 mg Oral BID WC  . enoxaparin  40 mg Subcutaneous Q24H  . furosemide  20 mg Oral Daily  . hydrALAZINE  50 mg Oral TID  . senna  1 tablet Oral BID  . simvastatin  5 mg Oral QHS  . sodium chloride  3 mL Intravenous Q12H  . tiotropium  18 mcg Inhalation Daily  . vancomycin  1,000 mg Intravenous Q24H  . DISCONTD: sodium chloride  3 mL Intravenous Q12H   Continuous Infusions:  PRN Meds:.acetaminophen, acetaminophen, albuterol, alum & mag hydroxide-simeth, HYDROcodone-acetaminophen, morphine injection, ondansetron (ZOFRAN) IV, ondansetron, zolpidem, DISCONTD: sodium chloride, DISCONTD: sodium chloride  Antibiotics: Anti-infectives     Start     Dose/Rate Route Frequency Ordered Stop   08/23/11 2000   vancomycin (VANCOCIN) IVPB 1000 mg/200 mL premix        1,000 mg 200 mL/hr over 60 Minutes Intravenous Every 24 hours 08/23/11 1649     08/23/11 1800   Ampicillin-Sulbactam (UNASYN) 3 g in sodium chloride 0.9 % 100 mL IVPB        3 g 100 mL/hr over 60 Minutes Intravenous Every 8 hours 08/23/11 1649            Assessment/Plan: Principal Problem:  *Cellulitis-LLE -slowly getting better-erythema fading-and now much better than on admit, decrease in swelling too -MRI done 6/6-no abscess, but "incidental" Tibial plateau fracture -Doppler's-no obvious DVT-unlikely to be hyperacute DVT-given 7 day history of left leg swelling -continue with empiric Vanco/Unasyn -elevate left leg  Medial Tibial Plateau Fracture -seen by ortho-no further tx required -patient denies recent trauma or fall  RENAL INSUFFICIENCY  -At baseline  -Chronic kidney disease stage III at baseline   HYPERLIPIDEMIA  -Continue with statin   .Essential hypertension, benign -BP controlled -continue with Hydralazine, Lasix, Coreg  CORONARY ATHEROSCLEROSIS NATIVE CORONARY ARTERY  -Apparently has a history of nonobstructive coronary artery disease  -Continue with aspirin-no apparent abscess    Cardiomyopathy  -Clinically euvolemic  -Last echocardiogram done in 2012 shows a preserved ejection fraction  Disposition: Remain inpatient-likely d/c home 6/10  DVT Prophylaxis: Prophylactic Lovenox  Code Status: Full Code  Jeoffrey Massed, MD  [facility] Pager [phone]  If 7PM-7AM, please contact night-coverage [email or web address] Password [email or web address] 08/26/2011, 10:36 AM   LOS: 3 days

## 2011-08-27 DIAGNOSIS — E782 Mixed hyperlipidemia: Secondary | ICD-10-CM

## 2011-08-27 DIAGNOSIS — I10 Essential (primary) hypertension: Secondary | ICD-10-CM

## 2011-08-27 DIAGNOSIS — L03119 Cellulitis of unspecified part of limb: Secondary | ICD-10-CM

## 2011-08-27 DIAGNOSIS — N189 Chronic kidney disease, unspecified: Secondary | ICD-10-CM

## 2011-08-27 DIAGNOSIS — L02419 Cutaneous abscess of limb, unspecified: Secondary | ICD-10-CM

## 2011-08-27 MED ORDER — SULFAMETHOXAZOLE-TRIMETHOPRIM 800-160 MG PO TABS: 1.0000 | ORAL_TABLET | Freq: Two times a day (BID) | ORAL | Status: AC

## 2011-08-27 NOTE — Progress Notes (Signed)
Pt. discharge to floor,verbalized understanding of discharged instruction,medication,restriction,diet and follow up appointment.Baseline Vitals sign stable,Pt comfortable,no sign and symptom of distress. 

## 2011-08-27 NOTE — Discharge Summary (Signed)
PATIENT DETAILS Name: [name] Rice Age: 76 y.o. Sex: male Date of Birth: 1935/03/25 MRN: [id number]. Admit Date: 08/23/2011 Admitting Physician: Maretta Bees, MD WUJ:WJXB,JYNW, MD, MD  PRIMARY DISCHARGE DIAGNOSIS:  Principal Problem:  *Cellulitis Active Problems:  HYPERLIPIDEMIA  Essential hypertension, benign  CORONARY ATHEROSCLEROSIS NATIVE CORONARY ARTERY  Secondary cardiomyopathy  RENAL INSUFFICIENCY      PAST MEDICAL HISTORY: Past Medical History  Diagnosis Date  . NICM (nonischemic cardiomyopathy)     LVEF 55% 3/11  . Coronary atherosclerosis of native coronary artery     Nonobstructive  . Essential hypertension, benign   . COPD (chronic obstructive pulmonary disease)   . Pulmonary hypertension   . Mitral regurgitation     Moderate  . Ventricular dysfunction, right   . Tricuspid regurgitation   . Lower extremity edema   . GERD (gastroesophageal reflux disease)   . DJD (degenerative joint disease)   . Bradycardia   . Paroxysmal atrial fibrillation   . CKD (chronic kidney disease), stage III   . Dialysis patient     "went 3 days/wk for about 3 months; don't remember when I went"    DISCHARGE MEDICATIONS: Medication List  As of 08/27/2011 10:59 AM   TAKE these medications         amLODipine 10 MG tablet   Commonly known as: NORVASC   Take 10 mg by mouth daily.      aspirin 81 MG tablet   Take 81 mg by mouth daily.      carvedilol 6.25 MG tablet   Commonly known as: COREG   Take 6.25 mg by mouth 2 (two) times daily with a meal.      furosemide 40 MG tablet   Commonly known as: LASIX   Take 20 mg by mouth daily.      hydrALAZINE 50 MG tablet   Commonly known as: APRESOLINE   Take 50 mg by mouth 3 (three) times daily.      simvastatin 5 MG tablet   Commonly known as: ZOCOR   Take 5 mg by mouth at bedtime.      sulfamethoxazole-trimethoprim 800-160 MG per tablet   Commonly known as: BACTRIM DS,SEPTRA DS   Take 1 tablet by mouth 2 (two) times  daily.      tiotropium 18 MCG inhalation capsule   Commonly known as: SPIRIVA   Place 18 mcg into inhaler and inhale daily.           BRIEF HPI:  See H&P, Labs, Consult and Test reports for all details in brief, patient was admitted for Left leg swelling/erythema and pain that started around 7 days prior to admission, he was seen by his PCP, and subsequently presented to the hospital as a direct admit for further evaluation and treatment.  CONSULTATIONS:   orthopedic surgery  PERTINENT RADIOLOGIC STUDIES: Dg Chest 2 View  08/23/2011  *RADIOLOGY REPORT*  Clinical Data: 76 year old male with cough, obstructive pulmonary disease.  Question CHF.  CHEST - 2 VIEW  Comparison: 01/19/2008 and earlier.  Findings: Chronic large lung volumes.  Cardiac size is stable at the upper limits of normal.  Chronic tortuous thoracic aorta. Other mediastinal contours are within normal limits.  Visualized tracheal air column is within normal limits.  No pneumothorax, pulmonary edema, pleural effusion or confluent pulmonary opacity.  There is chronic apical scarring.  Asymmetry in the left lung apex appears increased from prior exams, but significance is unclear. No acute osseous abnormality identified.  IMPRESSION: 1.  No  acute pneumonia or evidence of congestive heart failure identified. 2.  Chronic lung disease.  Asymmetric opacity in the left apex has increased since 2009.  This may be related to scarring, but recommend follow-up chest CT (IV contrast preferred) to characterize further.  These results will be called to the ordering clinician or representative by the Radiologist Assistant, and communication documented in the PACS Dashboard.  Original Report Authenticated By: Harley Hallmark, M.D.   Ct Knee Left Wo Contrast  08/24/2011  *RADIOLOGY REPORT*  Clinical Data: Pain and swelling.  Fracture on prior MRI.  CT OF THE LEFT KNEE WITHOUT CONTRAST  Technique:  Multidetector CT imaging was performed according to the  standard protocol. Multiplanar CT image reconstructions were also generated.  Comparison: Plain films 08/24/2011 and 11/06/1978 and MRI of the left lower extremity 08/23/2011.  Findings: As seen on the patient's MRI, there is a fracture of the medial tibial plateau.  The fracture involves the posterior aspect of the tibial plateau. The fracture fragment measures 3.1 cm transverse by 2.3 cm A P at the articular surface.  The fracture exits the metaphysis 3 cm below the articular surface.  There is minimal step off of 1-2 mm at the articular surface.  No other fracture is identified.  The patient has tricompartmental osteoarthritis which appears severe.  Chondrocalcinosis is present and appears worst in the lateral meniscus.  Calcific tendinopathy of the popliteus tendon is identified.  There is a small to moderate joint effusion.  IMPRESSION:  1.  Fracture of the posterior medial tibial plateau with minimal step off at the articular surface of 1-2 mm.  Associated joint effusion noted. 2.  Advanced tricompartmental degenerative disease. 3.  Chondrocalcinosis.  Original Report Authenticated By: Bernadene Bell. Maricela Curet, M.D.   Mr Tibia Fibula Left Wo Contrast  08/23/2011  *RADIOLOGY REPORT*  Clinical Data: Left leg pain and swelling.  MRI LEFT LOWER EXTREMITY WITHOUT CONTRAST  Technique:  Multiplanar, multisequence MR imaging of the left lower extremity was performed.  No intravenous contrast was administered.  Comparison:   None  Findings:  There is a medial tibial plateau fracture on the left. No significant depression or displacement.  A CT examination of the knee may be helpful for further evaluation. There is an associated joint effusion.  There are significant tricompartmental degenerative changes involving both knees.  The tibial and fibular shafts are intact.  No marrow edema, bone contusion or fracture.  There is diffuse soft tissue swelling/edema and fluid in the subcutaneous fat involving entire left lower  extremity.  Findings could be due to cellulitis.  No discrete/focal abscess is identified.  No findings for myofasciitis, osteomyelitis or septic arthritis.  IMPRESSION:  1.  Medial tibial plateau fracture.  CT examination of the knee would be helpful for further evaluation. 2.  Diffuse subcutaneous soft tissue swelling/edema suggesting cellulitis without focal abscess or findings for myofasciitis, septic arthritis or osteomyelitis.  Original Report Authenticated By: P. Loralie Champagne, M.D.   Dg Knee Complete 4 Views Left  08/24/2011  *RADIOLOGY REPORT*  Clinical Data: Tibial fracture.  LEFT KNEE - COMPLETE 4+ VIEW  Comparison: 08/23/2011  Findings: No joint effusion.  Severe tricompartment osteoarthritis noted.  Slightly depressed medial tibial plateau fracture is identified. This is better illustrated on recent MRI.  IMPRESSION:  1.  Medial plateau fracture which is better seen on MRI from 08/23/2011. 2.  Severe tricompartment osteoarthritis.  Original Report Authenticated By: Rosealee Albee, M.D.     PERTINENT LAB RESULTS: CBC:  Basename 08/25/11 0650  WBC 6.7  HGB 10.0*  HCT 30.1*  PLT 134*   CMET CMP     Component Value Date/Time   NA 136 08/25/2011 0650   K 3.7 08/25/2011 0650   CL 102 08/25/2011 0650   CO2 21 08/25/2011 0650   GLUCOSE 100* 08/25/2011 0650   BUN 17 08/25/2011 0650   CREATININE 1.55* 08/25/2011 0650   CALCIUM 8.8 08/25/2011 0650   CALCIUM 7.2* 03/21/2007 1725   PROT 6.2 08/24/2011 0648   ALBUMIN 3.1* 08/24/2011 0648   AST 11 08/24/2011 0648   ALT 7 08/24/2011 0648   ALKPHOS 64 08/24/2011 0648   BILITOT 0.4 08/24/2011 0648   GFRNONAA 42* 08/25/2011 0650   GFRAA 49* 08/25/2011 0650    GFR Estimated Creatinine Clearance: 42.3 ml/min (by C-G formula based on Cr of 1.55). No results found for this basename: LIPASE:2,AMYLASE:2 in the last 72 hours No results found for this basename: CKTOTAL:3,CKMB:3,CKMBINDEX:3,TROPONINI:3 in the last 72 hours No components found with this basename:  POCBNP:3 No results found for this basename: DDIMER:2 in the last 72 hours No results found for this basename: HGBA1C:2 in the last 72 hours No results found for this basename: CHOL:2,HDL:2,LDLCALC:2,TRIG:2,CHOLHDL:2,LDLDIRECT:2 in the last 72 hours No results found for this basename: TSH,T4TOTAL,FREET3,T3FREE,THYROIDAB in the last 72 hours No results found for this basename: VITAMINB12:2,FOLATE:2,FERRITIN:2,TIBC:2,IRON:2,RETICCTPCT:2 in the last 72 hours Coags: No results found for this basename: PT:2,INR:2 in the last 72 hours Microbiology: No results found for this or any previous visit (from the past 240 hour(s)).   BRIEF HOSPITAL COURSE:   Principal Problem: *Cellulitis-LLE  -patient was admitted with significant swelling/erythema and pain of his lower extremity. He however was afebrile and did not have leukocytosis -A doppler Ultrasound of the B/L lower ext was done, which showed:  No evidence of deep vein or superficial thrombosis involving  the right lower extremity and left lower extremity. The left  mid to distal femoral vein and proximal popliteal vein  segments were noncompressible and dilated. Thisis likely due  to swelling and edema, however hyperacute thrombus cannot be  Excluded. -Given the fact that the history was of 7 days-hyperacute thrombus as noted above as felt to be highly unlikely -Patient was subsequently admitted and started on empriric Vanco/Unasysn, legs were kept elevated. Because of significant swelling a MRI of the left leg without contrast was done, which shower:  Medial tibial plateau fracture.    Diffuse subcutaneous soft tissue swelling/edema suggesting   cellulitis without focal abscess or findings for myofasciitis,   septic arthritis or osteomyelitis. -Given the fact there was the above noted fracture-a CT scan of left knee was done, which showed:  Fracture of the posterior medial tibial plateau with minimal   step off at the articular surface of  1-2 mm. Associated joint   effusion noted.    Advanced tricompartmental degenerative disease.   Chondrocalcinosis. -Subsequently orthopedics was consulted-who advised to further intervention or work up -Patient did rather well with empiric antibiotics, his leg swelling has significantly come down, and now is only minimal, his erythematous are was marked on admission, and by the time of discharge it has subsequently decreased as well. Patient is anxious to go home, he will be transitioned to bactrim on discharge to continue for another week, he is to continue to keep his left leg elevated as much as possible.  Medial Tibial Plateau Fracture  -seen by ortho-no further tx required -see above   RENAL INSUFFICIENCY  -At baseline  -  Chronic kidney disease stage III at baseline   HYPERLIPIDEMIA  -Continue with statin   .Essential hypertension, benign  -BP controlled  -continue with Hydralazine, Lasix, Coreg   CORONARY ATHEROSCLEROSIS NATIVE CORONARY ARTERY  -Apparently has a history of nonobstructive coronary artery disease  -Continue with aspirin-no apparent abscess   Cardiomyopathy  -Clinically euvolemic  -Last echocardiogram done in 2012 shows a preserved ejection fraction   TODAY-DAY OF DISCHARGE:  Subjective:   Jaysen Wey today has no headache,no chest abdominal pain,no new weakness tingling or numbness, feels much better wants to go home today. His left leg is much more improved, with significant decrease in swelling/erythema/pain.  Objective:   Blood pressure 122/66, pulse 67, temperature 97.8 F (36.6 C), temperature source Oral, resp. rate 17, height 5\' 7"  (1.702 m), weight 79.561 kg (175 lb 6.4 oz), SpO2 90.00%.  Intake/Output Summary (Last 24 hours) at 08/27/11 1059 Last data filed at 08/27/11 0924  Gross per 24 hour  Intake   1140 ml  Output    650 ml  Net    490 ml    Exam Awake Alert, Oriented *3, No new F.N deficits, Normal affect [place].AT,PERRAL Supple  Neck,No JVD, No cervical lymphadenopathy appriciated.  Symmetrical Chest wall movement, Good air movement bilaterally, CTAB RRR,No Gallops,Rubs or new Murmurs, No Parasternal Heave +ve B.Sounds, Abd Soft, Non tender, No organomegaly appriciated, No rebound -guarding or rigidity. No Cyanosis, Clubbing or edema, No new Rash or bruise Left Lower Extremity: now with minimal edema, erythema significantly better from the marked area-with significant receding of the erythema!  DISPOSITION: Home   DISCHARGE INSTRUCTIONS:    Follow-up Information    Follow up with HALL,ZACK, MD. Schedule an appointment as soon as possible for a visit in 1 week.   Contact information:   [address] [place] [address] [phone]       Follow up with Shelda Pal, MD. Schedule an appointment as soon as possible for a visit in 2 weeks.   Contact information:   [facility] [address], [address] [place] [address] [phone]         Total Time spent on discharge equals 45 minutes.  SignedJeoffrey Massed 08/27/2011 10:59 AM

## 2011-09-03 ENCOUNTER — Other Ambulatory Visit (HOSPITAL_COMMUNITY): Payer: Self-pay | Admitting: Nephrology

## 2011-09-03 DIAGNOSIS — N289 Disorder of kidney and ureter, unspecified: Secondary | ICD-10-CM

## 2011-09-13 ENCOUNTER — Ambulatory Visit (HOSPITAL_COMMUNITY)
Admission: RE | Admit: 2011-09-13 | Discharge: 2011-09-13 | Disposition: A | Discharge status: Home / Self Care | Payer: Medicare Other | Source: Ambulatory Visit | Attending: Nephrology | Admitting: Nephrology

## 2011-09-13 ENCOUNTER — Ambulatory Visit (HOSPITAL_COMMUNITY)
Admission: RE | Admit: 2011-09-13 | Discharge: 2011-09-13 | Disposition: A | Discharge status: Home / Self Care | Payer: Medicare Other | Source: Ambulatory Visit | Attending: Internal Medicine | Admitting: Internal Medicine

## 2011-09-13 DIAGNOSIS — I10 Essential (primary) hypertension: Secondary | ICD-10-CM | POA: Insufficient documentation

## 2011-09-13 DIAGNOSIS — I359 Nonrheumatic aortic valve disorder, unspecified: Secondary | ICD-10-CM

## 2011-09-13 DIAGNOSIS — I059 Rheumatic mitral valve disease, unspecified: Secondary | ICD-10-CM | POA: Insufficient documentation

## 2011-09-13 DIAGNOSIS — I270 Primary pulmonary hypertension: Secondary | ICD-10-CM | POA: Insufficient documentation

## 2011-09-13 DIAGNOSIS — I517 Cardiomegaly: Secondary | ICD-10-CM | POA: Insufficient documentation

## 2011-09-13 DIAGNOSIS — I2510 Atherosclerotic heart disease of native coronary artery without angina pectoris: Secondary | ICD-10-CM | POA: Insufficient documentation

## 2011-09-13 DIAGNOSIS — I079 Rheumatic tricuspid valve disease, unspecified: Secondary | ICD-10-CM | POA: Insufficient documentation

## 2011-09-13 DIAGNOSIS — N289 Disorder of kidney and ureter, unspecified: Secondary | ICD-10-CM

## 2011-09-13 NOTE — Progress Notes (Signed)
*  PRELIMINARY RESULTS* Echocardiogram 2D Echocardiogram has been performed.  Caswell Corwin 09/13/2011, 11:25 AM

## 2011-10-04 ENCOUNTER — Ambulatory Visit (INDEPENDENT_AMBULATORY_CARE_PROVIDER_SITE_OTHER): Payer: Medicare Other | Admitting: Adult Health

## 2011-10-04 ENCOUNTER — Encounter: Payer: Self-pay | Admitting: Adult Health

## 2011-10-04 VITALS — BP 128/60 | HR 60 | Ht 67.0 in | Wt 162.0 lb

## 2011-10-04 DIAGNOSIS — I10 Essential (primary) hypertension: Secondary | ICD-10-CM

## 2011-10-04 DIAGNOSIS — I2510 Atherosclerotic heart disease of native coronary artery without angina pectoris: Secondary | ICD-10-CM

## 2011-10-04 DIAGNOSIS — N259 Disorder resulting from impaired renal tubular function, unspecified: Secondary | ICD-10-CM

## 2011-10-04 MED ORDER — FUROSEMIDE 40 MG PO TABS: 40.0000 mg | ORAL_TABLET | Freq: Every day | ORAL | Status: DC

## 2011-10-04 NOTE — Assessment & Plan Note (Signed)
Blood pressure is well controlled. NO changes in medications at this time. Will see him in 6 months.

## 2011-10-04 NOTE — Assessment & Plan Note (Signed)
Review of most recent last on 09/06/2011 show Creatinine of 1.55. He is now taking lasix 40 mg daily. He has appointment with Dr. Fausto Skillern next week with labs. Will defer to him concerning his lasix. Currently, other than mild LEE there are no overt signs of CHF.

## 2011-10-04 NOTE — Assessment & Plan Note (Signed)
NO cardiac complaints. Continue current medications.

## 2011-10-04 NOTE — Progress Notes (Signed)
HPI: [name] is a 76 y/o patient of Dr. Diona Browner we are seeing for ongoing assessment and treatment of hypertension, nonobstructive CAD,with known history of CKD followed by Dr. Fausto Skillern, chronic LEE, COPD. He was recently hospitalized in 08/2011 for bilateral cellulitis where he was treated with antibiotics. He had recent renal ultrasound with normal results on 09/13/2011. He has been without cardiac complaint. His wife is confused about lasix dose. He was on Lasix 40 mg !/2 table daily, but home health nurse told her that he should be taking 40 mg BID per nephrology.. I have no notes from Dr. Fausto Skillern to confirm. His wife only has him on 40 mg daily now, stating she was uncomfortable going up more than that. He has been on this dose for 3 weeks. He denies cramping or dizziness. No chest pain, but has chronic DOE, NYHA Class II.  No Known Allergies  Current Outpatient Prescriptions  Medication Sig Dispense Refill  . amLODipine (NORVASC) 10 MG tablet Take 10 mg by mouth daily.      Marland Kitchen aspirin 81 MG tablet Take 81 mg by mouth daily.        . carvedilol (COREG) 6.25 MG tablet Take 6.25 mg by mouth 2 (two) times daily with a meal.      . furosemide (LASIX) 40 MG tablet Take 1 tablet (40 mg total) by mouth daily.  30 tablet  6  . hydrALAZINE (APRESOLINE) 50 MG tablet Take 50 mg by mouth 3 (three) times daily.      . simvastatin (ZOCOR) 5 MG tablet Take 5 mg by mouth at bedtime.      Marland Kitchen tiotropium (SPIRIVA) 18 MCG inhalation capsule Place 18 mcg into inhaler and inhale daily.       Marland Kitchen DISCONTD: amLODipine (NORVASC) 10 MG tablet Take 1 tablet (10 mg total) by mouth daily.  90 tablet  3  . DISCONTD: carvedilol (COREG) 6.25 MG tablet Take 1 tablet (6.25 mg total) by mouth 2 (two) times daily with a meal.  60 tablet  6  . DISCONTD: simvastatin (ZOCOR) 5 MG tablet Take 1 tablet (5 mg total) by mouth at bedtime.  30 tablet  6    Past Medical History  Diagnosis Date  . NICM (nonischemic cardiomyopathy)    LVEF 55% 3/11  . Coronary atherosclerosis of native coronary artery     Nonobstructive  . Essential hypertension, benign   . COPD (chronic obstructive pulmonary disease)   . Pulmonary hypertension   . Mitral regurgitation     Moderate  . Ventricular dysfunction, right   . Tricuspid regurgitation   . Lower extremity edema   . GERD (gastroesophageal reflux disease)   . DJD (degenerative joint disease)   . Bradycardia   . Paroxysmal atrial fibrillation   . CKD (chronic kidney disease), stage III   . Dialysis patient     "went 3 days/wk for about 3 months; don't remember when I went"    Past Surgical History  Procedure Date  . Total hip arthroplasty ~ 2005    left  . Dialysis fistula creation     left forearm  . Cataract extraction w/ intraocular lens implant     right    ZOX:WRUEAV of systems complete and found to be negative unless listed above  PHYSICAL EXAM BP 128/60  Pulse 60  Ht 5\' 7"  (1.702 m)  Wt 162 lb (73.483 kg)  BMI 25.37 kg/m2  General: Well developed, well nourished, in no acute distress Head: Eyes  PERRLA, No xanthomas.   Normal cephalic and atramatic  Lungs: Clear bilaterally to auscultation and percussion, with mild scattered crackles in the bases. Heart: HRRR S1 S2, 2/6 systolic murmur both at LSB and apex, Pulses are 2+ & equal.            No carotid bruit. No JVD.  No abdominal bruits. No femoral bruits. Abdomen: Bowel sounds are positive, abdomen soft and non-tender without masses or                  Hernia's noted. Msk:  Back normal, normal gait. Normal strength and tone for age. Extremities: No clubbing, cyanosis or edema.  DP +1, Erythema and mild edema noted in the LEE bilaterally, L>R, but pt states that        His legs look 100% better than a few weeks ago. Neuro: Alert and oriented X 3. Psych:  Good affect, responds appropriately   ASSESSMENT AND PLAN

## 2011-10-04 NOTE — Addendum Note (Signed)
Addended by: Reather Laurence A on: 10/04/2011 03:59 PM   Modules accepted: Orders

## 2011-10-04 NOTE — Patient Instructions (Addendum)
Your physician recommends that you schedule a follow-up appointment in: 6 months  Your physician has recommended you make the following change in your medication:  1 - Continue Lasix 40 mg daily

## 2011-12-04 ENCOUNTER — Other Ambulatory Visit: Payer: Self-pay | Admitting: *Deleted

## 2011-12-04 MED ORDER — CARVEDILOL 6.25 MG PO TABS: 6.2500 mg | ORAL_TABLET | Freq: Two times a day (BID) | ORAL | Status: DC

## 2012-01-15 ENCOUNTER — Other Ambulatory Visit (HOSPITAL_COMMUNITY): Payer: Self-pay | Admitting: Internal Medicine

## 2012-01-29 ENCOUNTER — Other Ambulatory Visit: Payer: Self-pay | Admitting: *Deleted

## 2012-01-29 MED ORDER — SIMVASTATIN 5 MG PO TABS: 5.0000 mg | ORAL_TABLET | Freq: Every day | ORAL | Status: DC

## 2012-02-05 ENCOUNTER — Other Ambulatory Visit: Payer: Self-pay | Admitting: Cardiology

## 2012-02-05 MED ORDER — SIMVASTATIN 5 MG PO TABS: 5.0000 mg | ORAL_TABLET | Freq: Every day | ORAL | Status: DC

## 2012-04-02 ENCOUNTER — Encounter: Payer: Self-pay | Admitting: Cardiology

## 2012-04-02 ENCOUNTER — Ambulatory Visit (INDEPENDENT_AMBULATORY_CARE_PROVIDER_SITE_OTHER): Payer: Medicare Other | Admitting: Cardiology

## 2012-04-02 VITALS — BP 120/69 | HR 62 | Ht 67.0 in | Wt 171.1 lb

## 2012-04-02 DIAGNOSIS — I10 Essential (primary) hypertension: Secondary | ICD-10-CM

## 2012-04-02 DIAGNOSIS — R609 Edema, unspecified: Secondary | ICD-10-CM

## 2012-04-02 DIAGNOSIS — I2510 Atherosclerotic heart disease of native coronary artery without angina pectoris: Secondary | ICD-10-CM

## 2012-04-02 DIAGNOSIS — I429 Cardiomyopathy, unspecified: Secondary | ICD-10-CM

## 2012-04-02 MED ORDER — FUROSEMIDE 40 MG PO TABS: 60.0000 mg | ORAL_TABLET | Freq: Every day | ORAL | Status: DC

## 2012-04-02 NOTE — Patient Instructions (Addendum)
Your physician recommends that you schedule a follow-up appointment in: 3 MONTHS WITH SM  Your physician has recommended you make the following change in your medication:   1) INCREASE LASIX TO ONE AND HALF TABLETS DAILY TO EQUAL 60MG  DAILY

## 2012-04-02 NOTE — Assessment & Plan Note (Signed)
Will increase Lasix to 60 mg daily for now, continue to wrap legs as directed. He will see Dr. Kristian Covey for followup renal function early March. We will see him in 3 months.

## 2012-04-02 NOTE — Assessment & Plan Note (Signed)
History of nonobstructive disease, no active angina.

## 2012-04-02 NOTE — Assessment & Plan Note (Signed)
Nonischemic cardiomyopathy with normalization of LV function on medical therapy.

## 2012-04-02 NOTE — Progress Notes (Signed)
Clinical Summary [name] Rice is a 77 y.o.male presenting for followup. He last saw Ms. Lawrence NP back in July 2013. He is here with his wife today, reports worsening leg edema. They have been wrapping his legs which has helped some. He is still on Lasix at 40 mg daily, states that he had stable renal function as of December with Dr. Kristian Covey.  Weight is up 9 pounds from the last visit. ECG today shows sinus bradycardia.  Followup echocardiogram in June 2013 showed mild LVH with LV EF 55-60%, grade 1 diastolic dysfunction, mild aortic regurgitation, mild mitral regurgitation, mild tricuspid regurgitation.  No Known Allergies  Current Outpatient Prescriptions  Medication Sig Dispense Refill  . amLODipine (NORVASC) 10 MG tablet Take 10 mg by mouth daily.      Marland Kitchen aspirin 81 MG tablet Take 81 mg by mouth daily.        . carvedilol (COREG) 6.25 MG tablet Take 1 tablet (6.25 mg total) by mouth 2 (two) times daily with a meal.  60 tablet  11  . Cholecalciferol (VITAMIN D3) 1000 UNITS CAPS Take 1 capsule by mouth daily.      . furosemide (LASIX) 40 MG tablet Take 1.5 tablets (60 mg total) by mouth daily.  45 tablet  6  . gabapentin (NEURONTIN) 100 MG capsule Take 300 mg by mouth every 8 (eight) hours as needed.      . hydrALAZINE (APRESOLINE) 50 MG tablet Take 50 mg by mouth 3 (three) times daily.      . simvastatin (ZOCOR) 5 MG tablet Take 1 tablet (5 mg total) by mouth at bedtime.  90 tablet  3  . tiotropium (SPIRIVA) 18 MCG inhalation capsule Place 18 mcg into inhaler and inhale daily.       . [DISCONTINUED] amLODipine (NORVASC) 10 MG tablet Take 1 tablet (10 mg total) by mouth daily.  90 tablet  3  . [DISCONTINUED] carvedilol (COREG) 6.25 MG tablet Take 1 tablet (6.25 mg total) by mouth 2 (two) times daily with a meal.  60 tablet  6  . [DISCONTINUED] simvastatin (ZOCOR) 5 MG tablet Take 1 tablet (5 mg total) by mouth at bedtime.  30 tablet  6    Past Medical History  Diagnosis Date  . NICM  (nonischemic cardiomyopathy)     LVEF 55% 3/11  . Coronary atherosclerosis of native coronary artery     Nonobstructive  . Essential hypertension, benign   . COPD (chronic obstructive pulmonary disease)   . Pulmonary hypertension   . Mitral regurgitation     Moderate  . Ventricular dysfunction, right   . Tricuspid regurgitation   . Lower extremity edema   . GERD (gastroesophageal reflux disease)   . DJD (degenerative joint disease)   . Bradycardia   . Paroxysmal atrial fibrillation   . CKD (chronic kidney disease), stage III   . Dialysis patient     "went 3 days/wk for about 3 months; don't remember when I went"    Social History [name] Rice reports that he quit smoking about 11 years ago. His smoking use included Cigarettes. He has a 40 pack-year smoking history. He has never used smokeless tobacco. [name] Rice reports that he drinks alcohol.  Review of Systems No angina, no palpitations or syncope.  Physical Examination Filed Vitals:   04/02/12 1459  BP: 120/69  Pulse: 62   Filed Weights   04/02/12 1459  Weight: 171 lb 1.9 oz (77.62 kg)    Chronically ill-appearing male in  no acute distress, ambulates with walker.  HEENT: Conjunctiva and lids are normal, oropharynx clear with poor dentition.  Neck: Supple, no loud carotid bruits. Jugular venous pressure approximately 8 cm of water.  Lungs: Diminished breath sounds, otherwise clear without wheezing. Nonlabored breathing at rest.  Cardiac: Regular rate and rhythm, indistinct PMI, no S3 gallop.  Abdomen: Protuberant, bowel sounds present, no tenderness.  Extremities: Bilateral lower legs are wrapped.     Problem List and Plan   Edema Will increase Lasix to 60 mg daily for now, continue to wrap legs as directed. He will see Dr. Kristian Covey for followup renal function early March. We will see him in 3 months.  Essential hypertension, benign Reasonable blood pressure control today.  CORONARY ATHEROSCLEROSIS NATIVE  CORONARY ARTERY History of nonobstructive disease, no active angina.  Secondary cardiomyopathy Nonischemic cardiomyopathy with normalization of LV function on medical therapy.    Jonelle Sidle, M.D., F.A.C.C.

## 2012-04-02 NOTE — Assessment & Plan Note (Signed)
Reasonable blood pressure control today. 

## 2012-04-28 ENCOUNTER — Other Ambulatory Visit: Payer: Self-pay | Admitting: *Deleted

## 2012-04-28 MED ORDER — AMLODIPINE BESYLATE 10 MG PO TABS: 10.0000 mg | ORAL_TABLET | Freq: Every day | ORAL | Status: DC

## 2012-07-01 ENCOUNTER — Ambulatory Visit (INDEPENDENT_AMBULATORY_CARE_PROVIDER_SITE_OTHER): Payer: Medicare Other | Admitting: Cardiology

## 2012-07-01 ENCOUNTER — Encounter: Payer: Self-pay | Admitting: Cardiology

## 2012-07-01 VITALS — BP 126/79 | HR 70 | Ht 67.0 in | Wt 171.2 lb

## 2012-07-01 DIAGNOSIS — I2510 Atherosclerotic heart disease of native coronary artery without angina pectoris: Secondary | ICD-10-CM

## 2012-07-01 DIAGNOSIS — I429 Cardiomyopathy, unspecified: Secondary | ICD-10-CM

## 2012-07-01 DIAGNOSIS — I10 Essential (primary) hypertension: Secondary | ICD-10-CM

## 2012-07-01 DIAGNOSIS — R609 Edema, unspecified: Secondary | ICD-10-CM

## 2012-07-01 NOTE — Progress Notes (Signed)
Clinical Summary Mr. Kenley is a 77 y.o.male last seen in January of this year. Lasix was increased at the last visit to help with leg edema. He states that this has improved. He still has wraps put on his legs every other day, also elevates them. He reports having follow up with Dr. Kristian Covey, and we will request his most recent BMET.  Weight is stable compared to last visit.  No Known Allergies  Current Outpatient Prescriptions  Medication Sig Dispense Refill  . amLODipine (NORVASC) 10 MG tablet Take 1 tablet (10 mg total) by mouth daily.  90 tablet  3  . aspirin 81 MG tablet Take 81 mg by mouth daily.        . carvedilol (COREG) 6.25 MG tablet Take 1 tablet (6.25 mg total) by mouth 2 (two) times daily with a meal.  60 tablet  11  . Cholecalciferol (VITAMIN D3) 1000 UNITS CAPS Take 1 capsule by mouth daily.      . furosemide (LASIX) 40 MG tablet Take 1.5 tablets (60 mg total) by mouth daily.  45 tablet  6  . gabapentin (NEURONTIN) 100 MG capsule Take 300 mg by mouth every 8 (eight) hours as needed.      . hydrALAZINE (APRESOLINE) 50 MG tablet Take 50 mg by mouth 3 (three) times daily.      . simvastatin (ZOCOR) 5 MG tablet Take 1 tablet (5 mg total) by mouth at bedtime.  90 tablet  3  . tiotropium (SPIRIVA) 18 MCG inhalation capsule Place 18 mcg into inhaler and inhale daily.        No current facility-administered medications for this visit.    Past Medical History  Diagnosis Date  . NICM (nonischemic cardiomyopathy)     LVEF 55% 3/11  . Coronary atherosclerosis of native coronary artery     Nonobstructive  . Essential hypertension, benign   . COPD (chronic obstructive pulmonary disease)   . Pulmonary hypertension   . Mitral regurgitation     Moderate  . Ventricular dysfunction, right   . Tricuspid regurgitation   . Lower extremity edema   . GERD (gastroesophageal reflux disease)   . DJD (degenerative joint disease)   . Bradycardia   . Paroxysmal atrial fibrillation   .  CKD (chronic kidney disease), stage III     Has had temporary dialysis in the past    Social History Mr. Cataldo reports that he quit smoking about 11 years ago. His smoking use included Cigarettes. He has a 40 pack-year smoking history. He has never used smokeless tobacco. Mr. Kerce reports that  drinks alcohol.  Review of Systems No chest pain or palpitations. No orthopnea. Stable appetite.  Physical Examination Filed Vitals:   07/01/12 1320  BP: 126/79  Pulse: 70   Filed Weights   07/01/12 1320  Weight: 171 lb 4 oz (77.678 kg)    Chronically ill-appearing male in no acute distress, ambulates with walker.  HEENT: Conjunctiva and lids are normal, oropharynx clear with poor dentition.  Neck: Supple, no loud carotid bruits. Jugular venous pressure approximately 8 cm of water.  Lungs: Diminished breath sounds, otherwise clear without wheezing. Nonlabored breathing at rest.  Cardiac: Regular rate and rhythm, indistinct PMI, no S3 gallop.  Abdomen: Protuberant, bowel sounds present, no tenderness.  Extremities: Bilateral lower legs are wrapped.    Problem List and Plan   Edema Generally improved. Continue compression measures and elevation. No change in current Lasix dose. Will request most recent BMET for  followup renal function.  Essential hypertension, benign Blood pressure control is good today. No changes made.  CORONARY ATHEROSCLEROSIS NATIVE CORONARY ARTERY No active angina symptoms with prior documentation of nonobstructive disease.  Secondary cardiomyopathy LVEF 55-60% as of June 2013.    Jonelle Sidle, M.D., F.A.C.C.

## 2012-07-01 NOTE — Assessment & Plan Note (Signed)
Blood pressure control is good today. No changes made. 

## 2012-07-01 NOTE — Assessment & Plan Note (Signed)
LVEF 55-60% as of June 2013.

## 2012-07-01 NOTE — Assessment & Plan Note (Signed)
Generally improved. Continue compression measures and elevation. No change in current Lasix dose. Will request most recent BMET for followup renal function.

## 2012-07-01 NOTE — Patient Instructions (Addendum)
Your physician recommends that you schedule a follow-up appointment in: 3 months.  

## 2012-07-01 NOTE — Assessment & Plan Note (Signed)
No active angina symptoms with prior documentation of nonobstructive disease.

## 2012-07-04 ENCOUNTER — Encounter: Payer: Self-pay | Admitting: Cardiology

## 2012-10-08 ENCOUNTER — Encounter: Payer: Self-pay | Admitting: Cardiology

## 2012-10-08 ENCOUNTER — Ambulatory Visit (INDEPENDENT_AMBULATORY_CARE_PROVIDER_SITE_OTHER): Payer: Medicare Other | Admitting: Cardiology

## 2012-10-08 VITALS — BP 150/67 | HR 66 | Ht 67.0 in | Wt 166.2 lb

## 2012-10-08 DIAGNOSIS — R609 Edema, unspecified: Secondary | ICD-10-CM

## 2012-10-08 DIAGNOSIS — I2510 Atherosclerotic heart disease of native coronary artery without angina pectoris: Secondary | ICD-10-CM

## 2012-10-08 DIAGNOSIS — I429 Cardiomyopathy, unspecified: Secondary | ICD-10-CM

## 2012-10-08 DIAGNOSIS — N259 Disorder resulting from impaired renal tubular function, unspecified: Secondary | ICD-10-CM

## 2012-10-08 NOTE — Assessment & Plan Note (Signed)
LVEF 55-60% as of June 2013. Weight is down 5 pounds. Continue medical therapy.

## 2012-10-08 NOTE — Progress Notes (Signed)
Clinical Summary [name] is a 77 y.o.male last seen in April. He is here with his cousin. He reports stable leg edema, using lower extremity compression wraps every other day. Lasix dose has been stable. He reports a recent visit with Dr. Kristian Rice with stable renal dysfunction.  Weight is down 5 pounds from last visit.  Lab work from March of this year revealed BUN 39, creatinine 1.8, GFR 35, potassium 3.7.  ECG today shows sinus rhythm with PVC, nonspecific ST-T changes.  No Known Allergies  Current Outpatient Prescriptions  Medication Sig Dispense Refill  . amLODipine (NORVASC) 10 MG tablet Take 1 tablet (10 mg total) by mouth daily.  90 tablet  3  . aspirin 81 MG tablet Take 81 mg by mouth daily.        . carvedilol (COREG) 6.25 MG tablet Take 1 tablet (6.25 mg total) by mouth 2 (two) times daily with a meal.  60 tablet  11  . Cholecalciferol (VITAMIN D3) 1000 UNITS CAPS Take 1 capsule by mouth daily.      . furosemide (LASIX) 40 MG tablet Take 1.5 tablets (60 mg total) by mouth daily.  45 tablet  6  . gabapentin (NEURONTIN) 100 MG capsule Take 300 mg by mouth every 8 (eight) hours as needed.      . hydrALAZINE (APRESOLINE) 50 MG tablet Take 50 mg by mouth 3 (three) times daily.      . simvastatin (ZOCOR) 5 MG tablet Take 1 tablet (5 mg total) by mouth at bedtime.  90 tablet  3  . tiotropium (SPIRIVA) 18 MCG inhalation capsule Place 18 mcg into inhaler and inhale daily.        No current facility-administered medications for this visit.    Past Medical History  Diagnosis Date  . NICM (nonischemic cardiomyopathy)     LVEF 55% 3/11  . Coronary atherosclerosis of native coronary artery     Nonobstructive  . Essential hypertension, benign   . COPD (chronic obstructive pulmonary disease)   . Pulmonary hypertension   . Mitral regurgitation     Moderate  . Ventricular dysfunction, right   . Tricuspid regurgitation   . Lower extremity edema   . GERD (gastroesophageal reflux  disease)   . DJD (degenerative joint disease)   . Bradycardia   . Paroxysmal atrial fibrillation   . CKD (chronic kidney disease), stage III     Has had temporary dialysis in the past    Social History [name] reports that he quit smoking about 11 years ago. His smoking use included Cigarettes. He has a 40 pack-year smoking history. He has never used smokeless tobacco. [name] reports that  drinks alcohol.  Review of Systems Stable dyspnea on exertion. Using a walker, reports no falls. Stable appetite. No fevers or chills. Otherwise negative.  Physical Examination Filed Vitals:   10/08/12 1313  BP: 150/67  Pulse: 66   Filed Weights   10/08/12 1313  Weight: 166 lb 4 oz (75.411 kg)    Chronically ill-appearing male in no acute distress, ambulates with walker.  HEENT: Conjunctiva and lids are normal, oropharynx clear with poor dentition.  Neck: Supple, no loud carotid bruits. Jugular venous pressure approximately 8 cm of water.  Lungs: Diminished breath sounds, otherwise clear without wheezing. Nonlabored breathing at rest.  Cardiac: Regular rate and rhythm, indistinct PMI, no S3 gallop.  Abdomen: Protuberant, bowel sounds present, no tenderness.  Extremities: Stable bilateral lower leg edema and stasis.   Problem List  and Plan   CORONARY ATHEROSCLEROSIS NATIVE CORONARY ARTERY Nonobstructive, no active angina. ECG stable.  Secondary cardiomyopathy LVEF 55-60% as of June 2013. Weight is down 5 pounds. Continue medical therapy.  Edema Generally improved.  RENAL INSUFFICIENCY Keep followup with Dr. Kristian Rice.    [name] Rice, M.D., F.A.C.C.

## 2012-10-08 NOTE — Patient Instructions (Addendum)
Your physician recommends that you schedule a follow-up appointment in: 4 MONTHS WITH DR Dionicia Abler

## 2012-10-08 NOTE — Assessment & Plan Note (Signed)
Nonobstructive, no active angina. ECG stable.

## 2012-10-08 NOTE — Assessment & Plan Note (Signed)
Keep followup with Dr. Befekadu. 

## 2012-10-08 NOTE — Assessment & Plan Note (Signed)
Generally improved.

## 2012-10-13 ENCOUNTER — Encounter: Payer: Self-pay | Admitting: Cardiology

## 2012-12-01 ENCOUNTER — Other Ambulatory Visit: Payer: Self-pay | Admitting: *Deleted

## 2012-12-01 MED ORDER — CARVEDILOL 6.25 MG PO TABS: 6.2500 mg | ORAL_TABLET | Freq: Two times a day (BID) | ORAL | Status: DC

## 2013-02-05 ENCOUNTER — Ambulatory Visit (INDEPENDENT_AMBULATORY_CARE_PROVIDER_SITE_OTHER): Payer: Medicare Other | Admitting: Cardiology

## 2013-02-05 ENCOUNTER — Encounter: Payer: Self-pay | Admitting: Cardiology

## 2013-02-05 VITALS — BP 142/62 | HR 72 | Ht 67.0 in | Wt 166.0 lb

## 2013-02-05 DIAGNOSIS — N183 Chronic kidney disease, stage 3 unspecified: Secondary | ICD-10-CM

## 2013-02-05 DIAGNOSIS — I429 Cardiomyopathy, unspecified: Secondary | ICD-10-CM

## 2013-02-05 DIAGNOSIS — I10 Essential (primary) hypertension: Secondary | ICD-10-CM

## 2013-02-05 DIAGNOSIS — I2510 Atherosclerotic heart disease of native coronary artery without angina pectoris: Secondary | ICD-10-CM

## 2013-02-05 NOTE — Assessment & Plan Note (Signed)
Blood pressure mildly elevated today, no change to current regimen. Keep followup with Dr. Margo Aye.

## 2013-02-05 NOTE — Patient Instructions (Signed)
Your physician wants you to follow-up in: 6 MONTHS You will receive a reminder letter in the mail two months in advance. If you don't receive a letter, please call our office to schedule the follow-up appointment. 

## 2013-02-05 NOTE — Assessment & Plan Note (Signed)
No angina symptoms with history of nonobstructive CAD. Continues on aspirin and statin. 

## 2013-02-05 NOTE — Progress Notes (Signed)
Clinical Summary Mr. Strey is a 77 y.o.male last seen in July. No major change in cardiac status, no hospitalizations. He reports good control of his leg edema which is chronic. Uses compression hose occasionally.  Lab work in July showed potassium 3.9, BUN 34, creatinine 1.6. Weight is stable compared to July. He has nephrology for followup pending in December.  Echocardiogram from June 2013 showed mild LVH with LVEF 55-60%, grade 1 diastolic dysfunction, mild aortic regurgitation, mild mitral regurgitation, mild tricuspid regurgitation, PASP 39 mm mercury.  We reviewed his medications, Lasix is 80 mg daily at this point.  No Known Allergies  Current Outpatient Prescriptions  Medication Sig Dispense Refill  . amLODipine (NORVASC) 10 MG tablet Take 1 tablet (10 mg total) by mouth daily.  90 tablet  3  . aspirin 81 MG tablet Take 81 mg by mouth daily.        . carvedilol (COREG) 6.25 MG tablet Take 1 tablet (6.25 mg total) by mouth 2 (two) times daily with a meal.  60 tablet  3  . Cholecalciferol (VITAMIN D3) 1000 UNITS CAPS Take 1 capsule by mouth daily.      . furosemide (LASIX) 40 MG tablet Take 1.5 tablets (60 mg total) by mouth daily.  45 tablet  6  . gabapentin (NEURONTIN) 100 MG capsule Take 300 mg by mouth every 8 (eight) hours as needed.      . hydrALAZINE (APRESOLINE) 50 MG tablet Take 50 mg by mouth 3 (three) times daily.      Marland Kitchen tiotropium (SPIRIVA) 18 MCG inhalation capsule Place 18 mcg into inhaler and inhale daily.       . simvastatin (ZOCOR) 5 MG tablet Take 1 tablet (5 mg total) by mouth at bedtime.  90 tablet  3   No current facility-administered medications for this visit.    Past Medical History  Diagnosis Date  . NICM (nonischemic cardiomyopathy)     LVEF 55% 3/11  . Coronary atherosclerosis of native coronary artery     Nonobstructive  . Essential hypertension, benign   . COPD (chronic obstructive pulmonary disease)   . Pulmonary hypertension   . Mitral  regurgitation     Moderate  . Ventricular dysfunction, right   . Tricuspid regurgitation   . Lower extremity edema   . GERD (gastroesophageal reflux disease)   . DJD (degenerative joint disease)   . Bradycardia   . Paroxysmal atrial fibrillation   . CKD (chronic kidney disease), stage III     Has had temporary dialysis in the past    Social History Mr. Mesenbrink reports that he quit smoking about 11 years ago. His smoking use included Cigarettes. He has a 40 pack-year smoking history. He has never used smokeless tobacco. Mr. Boodram reports that he drinks alcohol.  Review of Systems No palpitations, NYHA class II dyspnea. No chest pain. Fairly inactive in terms of functional status. Stable appetite. Otherwise negative.  Physical Examination Filed Vitals:   02/05/13 1327  BP: 142/62  Pulse: 72   Filed Weights   02/05/13 1327  Weight: 166 lb (75.297 kg)    Chronically ill-appearing male in no acute distress, ambulates with walker.  HEENT: Conjunctiva and lids are normal, oropharynx clear with poor dentition.  Neck: Supple, no loud carotid bruits. Jugular venous pressure approximately 8 cm of water.  Lungs: Diminished breath sounds, otherwise clear without wheezing. Nonlabored breathing at rest.  Cardiac: Regular rate and rhythm, indistinct PMI, no S3 gallop.  Abdomen: Protuberant,  bowel sounds present, no tenderness.  Extremities: Stable bilateral lower leg edema and stasis.   Problem List and Plan   Secondary cardiomyopathy Most recent LVEF 55-60% by echocardiogram last year. Continue medical therapy.  CORONARY ATHEROSCLEROSIS NATIVE CORONARY ARTERY No angina symptoms with history of nonobstructive CAD. Continues on aspirin and statin.  CKD (chronic kidney disease) stage 3, GFR 30-59 ml/min Creatinine 1.6 in July, nephrology followup is pending soon.  Essential hypertension, benign Blood pressure mildly elevated today, no change to current regimen. Keep followup with Dr.  Margo Aye.    Jonelle Sidle, M.D., F.A.C.C.

## 2013-02-05 NOTE — Assessment & Plan Note (Signed)
Creatinine 1.6 in July, nephrology followup is pending soon.

## 2013-02-05 NOTE — Assessment & Plan Note (Signed)
Most recent LVEF 55-60% by echocardiogram last year. Continue medical therapy.

## 2013-02-05 NOTE — Addendum Note (Signed)
Addended by: Derry Lory A on: 02/05/2013 02:03 PM   Modules accepted: Orders

## 2013-03-20 ENCOUNTER — Telehealth: Payer: Self-pay

## 2013-03-20 MED ORDER — AMLODIPINE BESYLATE 10 MG PO TABS: 10.0000 mg | ORAL_TABLET | Freq: Every day | ORAL | Status: DC

## 2013-03-20 NOTE — Telephone Encounter (Signed)
Received fax refill request  Rx # [id number] Medication:  Amlodipine Besylate 10 Mg Tab Qty 90 Sig:  Take 1 tablet by mouth once daily Physician:  Diona Browner

## 2013-03-20 NOTE — Telephone Encounter (Signed)
Medication sent via escribe.  

## 2013-04-03 ENCOUNTER — Encounter (INDEPENDENT_AMBULATORY_CARE_PROVIDER_SITE_OTHER): Payer: Medicare Other | Admitting: Ophthalmology

## 2013-04-03 DIAGNOSIS — I10 Essential (primary) hypertension: Secondary | ICD-10-CM

## 2013-04-03 DIAGNOSIS — H43819 Vitreous degeneration, unspecified eye: Secondary | ICD-10-CM

## 2013-04-03 DIAGNOSIS — H2510 Age-related nuclear cataract, unspecified eye: Secondary | ICD-10-CM

## 2013-04-03 DIAGNOSIS — H3530 Unspecified macular degeneration: Secondary | ICD-10-CM

## 2013-04-03 DIAGNOSIS — H26499 Other secondary cataract, unspecified eye: Secondary | ICD-10-CM

## 2013-04-03 DIAGNOSIS — H35039 Hypertensive retinopathy, unspecified eye: Secondary | ICD-10-CM

## 2013-04-08 ENCOUNTER — Telehealth: Payer: Self-pay | Admitting: *Deleted

## 2013-04-08 ENCOUNTER — Telehealth: Payer: Self-pay | Admitting: Cardiology

## 2013-04-08 MED ORDER — HYDRALAZINE HCL 50 MG PO TABS: 50.0000 mg | ORAL_TABLET | Freq: Three times a day (TID) | ORAL | Status: DC

## 2013-04-08 MED ORDER — FUROSEMIDE 40 MG PO TABS: 80.0000 mg | ORAL_TABLET | Freq: Every day | ORAL | Status: DC

## 2013-04-08 MED ORDER — CARVEDILOL 6.25 MG PO TABS: 6.2500 mg | ORAL_TABLET | Freq: Two times a day (BID) | ORAL | Status: DC

## 2013-04-08 NOTE — Telephone Encounter (Signed)
Received fax refill request  Rx # [id number] Medication:  Carvedilol 6.25 mg tablet Qty 60 Sig:  Take one tablet twice a day with food Physician:  McDowellt

## 2013-04-08 NOTE — Telephone Encounter (Signed)
Medication sent via escribe.  

## 2013-04-08 NOTE — Telephone Encounter (Signed)
Pt has someone calling for him and needs all his medications called in to [facility]. She is not aware of the names of medications. Normal person that helps him is out of town.

## 2013-04-10 ENCOUNTER — Encounter: Payer: Self-pay | Admitting: Cardiology

## 2013-04-20 ENCOUNTER — Telehealth: Payer: Self-pay

## 2013-04-20 MED ORDER — SIMVASTATIN 5 MG PO TABS: 5.0000 mg | ORAL_TABLET | Freq: Every day | ORAL | Status: DC

## 2013-04-20 NOTE — Telephone Encounter (Signed)
Medication sent via escribe.  

## 2013-04-20 NOTE — Telephone Encounter (Signed)
Received fax refill request  Rx # [id number] Medication:  Simvastatin 5 mg tablet Qty 90 Sig:  Take 1 tablet by mouth at bedtime Physician:  mcdowell

## 2013-08-05 ENCOUNTER — Ambulatory Visit (INDEPENDENT_AMBULATORY_CARE_PROVIDER_SITE_OTHER): Payer: Medicare Other | Admitting: Cardiology

## 2013-08-05 ENCOUNTER — Encounter: Payer: Self-pay | Admitting: Cardiology

## 2013-08-05 VITALS — BP 140/70 | HR 57 | Ht 67.0 in | Wt 178.0 lb

## 2013-08-05 DIAGNOSIS — I10 Essential (primary) hypertension: Secondary | ICD-10-CM

## 2013-08-05 DIAGNOSIS — N183 Chronic kidney disease, stage 3 unspecified: Secondary | ICD-10-CM

## 2013-08-05 DIAGNOSIS — I429 Cardiomyopathy, unspecified: Secondary | ICD-10-CM

## 2013-08-05 DIAGNOSIS — I2510 Atherosclerotic heart disease of native coronary artery without angina pectoris: Secondary | ICD-10-CM

## 2013-08-05 MED ORDER — FUROSEMIDE 80 MG PO TABS: 80.0000 mg | ORAL_TABLET | Freq: Every day | ORAL | Status: DC

## 2013-08-05 NOTE — Assessment & Plan Note (Signed)
No angina symptoms with history of nonobstructive disease. 

## 2013-08-05 NOTE — Patient Instructions (Addendum)
Your physician wants you to follow-up in: 4 months You will receive a reminder letter in the mail two months in advance. If you don't receive a letter, please call our office to schedule the follow-up appointment.   Your physician has recommended you make the following change in your medication:     PLEASE INCREASE YOUR LASIX TO 80 MG TWICE A DAY FOR THE NEXT 5 DAYS and then take Lasix 80 mg once a day      Thank you for choosing [facility] !

## 2013-08-05 NOTE — Addendum Note (Signed)
Addended by: Marlyn Corporal A on: 08/05/2013 02:49 PM   Modules accepted: Orders

## 2013-08-05 NOTE — Assessment & Plan Note (Signed)
LVEF normal with diastolic dysfunction based on last assessment. He has evidence of fluid gain and increasing leg edema. Plan will be to advance Lasix to 80 mg twice daily for the next 5 days, then go back to standing dose at 80 mg in the morning. Reinforced sodium restriction.

## 2013-08-05 NOTE — Progress Notes (Signed)
Clinical Summary [name] is a 78 y.o.male last seen in November 2014. He reports no change in baseline shortness of breath, still somewhat functionally limited, uses a walker to ambulate. He has chronic leg edema.  Lab work from January of this year showed BUN 47, creatinine 1.9, sodium 142, potassium 3.7. He follows with Dr. Kristian Covey. Weight is up 12 pounds from his last visit.  Echocardiogram from June 2013 showed mild LVH with LVEF 55-60%, grade 1 diastolic dysfunction, mild aortic regurgitation, mild mitral regurgitation, mild tricuspid regurgitation, PASP 39 mm mercury.   No Known Allergies  Current Outpatient Prescriptions  Medication Sig Dispense Refill  . amLODipine (NORVASC) 10 MG tablet Take 1 tablet (10 mg total) by mouth daily.  90 tablet  3  . aspirin 81 MG tablet Take 81 mg by mouth daily.        . carvedilol (COREG) 6.25 MG tablet Take 1 tablet (6.25 mg total) by mouth 2 (two) times daily with a meal.  60 tablet  3  . Cholecalciferol (VITAMIN D3) 1000 UNITS CAPS Take 1 capsule by mouth daily.      . furosemide (LASIX) 40 MG tablet Take 2 tablets (80 mg total) by mouth daily.  30 tablet  6  . gabapentin (NEURONTIN) 100 MG capsule Take 300 mg by mouth every 8 (eight) hours as needed.      . hydrALAZINE (APRESOLINE) 50 MG tablet Take 1 tablet (50 mg total) by mouth 3 (three) times daily.  90 tablet  6  . simvastatin (ZOCOR) 5 MG tablet Take 1 tablet (5 mg total) by mouth at bedtime.  90 tablet  1  . tiotropium (SPIRIVA) 18 MCG inhalation capsule Place 18 mcg into inhaler and inhale daily.        No current facility-administered medications for this visit.    Past Medical History  Diagnosis Date  . NICM (nonischemic cardiomyopathy)     LVEF 55% 3/11  . Coronary atherosclerosis of native coronary artery     Nonobstructive  . Essential hypertension, benign   . COPD (chronic obstructive pulmonary disease)   . Pulmonary hypertension   . Mitral regurgitation    Moderate  . Ventricular dysfunction, right   . Tricuspid regurgitation   . Lower extremity edema   . GERD (gastroesophageal reflux disease)   . DJD (degenerative joint disease)   . Bradycardia   . Paroxysmal atrial fibrillation   . CKD (chronic kidney disease), stage III     Has had temporary dialysis in the past    Social History Mr. Imes reports that he quit smoking about 12 years ago. His smoking use included Cigarettes. He has a 40 pack-year smoking history. He has never used smokeless tobacco. Mr. Kriner reports that he drinks alcohol.  Review of Systems Denies any falls. Reports stable appetite. No chest pain or palpitations. Otherwise as outlined.  Physical Examination Filed Vitals:   08/05/13 1403  BP: 140/70  Pulse: 57   Filed Weights   08/05/13 1403  Weight: 178 lb (80.74 kg)    Chronically ill-appearing male in no acute distress, ambulates with walker.  HEENT: Conjunctiva and lids are normal, oropharynx clear with poor dentition.  Neck: Supple, no loud carotid bruits. Jugular venous pressure approximately 8 cm of water.  Lungs: Diminished breath sounds, otherwise clear without wheezing. Nonlabored breathing at rest - pursed lip breathing when he talks. Cardiac: Regular rate and rhythm, indistinct PMI, no S3 gallop.  Abdomen: Protuberant, bowel sounds present, no  tenderness.  Extremities: 2-3+ bilateral lower leg edema and stasis. Skin: Warm and dry. Muscular skeletal: No kyphosis. Neuropsychiatric: Alert and oriented x3, affect appropriate.   Problem List and Plan   Secondary cardiomyopathy LVEF normal with diastolic dysfunction based on last assessment. He has evidence of fluid gain and increasing leg edema. Plan will be to advance Lasix to 80 mg twice daily for the next 5 days, then go back to standing dose at 80 mg in the morning. Reinforced sodium restriction.  Essential hypertension, benign No change to current regimen.  CKD (chronic kidney disease)  stage 3, GFR 30-59 ml/min Followed by Dr. Kristian CoveyBefekadu. Last creatinine 1.9.  CORONARY ATHEROSCLEROSIS NATIVE CORONARY ARTERY No angina symptoms with history of nonobstructive disease.    Jonelle SidleSamuel G. McDowell, M.D., F.A.C.C.

## 2013-08-05 NOTE — Assessment & Plan Note (Signed)
Followed by Dr. Kristian Covey. Last creatinine 1.9.

## 2013-08-05 NOTE — Assessment & Plan Note (Signed)
No change to current regimen. 

## 2013-08-06 ENCOUNTER — Telehealth: Payer: Self-pay | Admitting: Cardiology

## 2013-08-06 MED ORDER — CARVEDILOL 6.25 MG PO TABS: 6.2500 mg | ORAL_TABLET | Freq: Two times a day (BID) | ORAL | Status: DC

## 2013-08-06 NOTE — Telephone Encounter (Signed)
refilled 

## 2013-08-06 NOTE — Telephone Encounter (Signed)
Needs refill on Carvedilol / tgs

## 2013-08-11 ENCOUNTER — Telehealth: Payer: Self-pay | Admitting: *Deleted

## 2013-08-11 MED ORDER — CARVEDILOL 6.25 MG PO TABS: 6.2500 mg | ORAL_TABLET | Freq: Two times a day (BID) | ORAL | Status: DC

## 2013-08-11 NOTE — Telephone Encounter (Signed)
Medication sent via escribe.  

## 2013-08-11 NOTE — Telephone Encounter (Signed)
CARVEDILOL 6.25 MG #30

## 2013-10-16 ENCOUNTER — Telehealth: Payer: Self-pay | Admitting: Cardiology

## 2013-10-16 MED ORDER — SIMVASTATIN 5 MG PO TABS: 5.0000 mg | ORAL_TABLET | Freq: Every day | ORAL | Status: DC

## 2013-10-16 NOTE — Telephone Encounter (Signed)
Refill request complete 

## 2013-10-16 NOTE — Telephone Encounter (Signed)
Received fax refill request  Rx # [id number]  Medication:  Simvastatin 5 mg tablet Qty 90 Sig:  Take one tablet by mouth at bedtime Physician:  Diona Browner

## 2013-11-24 ENCOUNTER — Telehealth: Payer: Self-pay | Admitting: *Deleted

## 2013-11-24 MED ORDER — HYDRALAZINE HCL 50 MG PO TABS: 50.0000 mg | ORAL_TABLET | Freq: Three times a day (TID) | ORAL | Status: DC

## 2013-11-24 NOTE — Telephone Encounter (Signed)
Refill complete 

## 2013-11-24 NOTE — Telephone Encounter (Signed)
[facility] FAXED REQUEST FOR hydralazine 50 mg #30

## 2013-12-10 ENCOUNTER — Encounter: Payer: Self-pay | Admitting: Cardiology

## 2013-12-10 ENCOUNTER — Ambulatory Visit (INDEPENDENT_AMBULATORY_CARE_PROVIDER_SITE_OTHER): Payer: Medicare Other | Admitting: Cardiology

## 2013-12-10 VITALS — BP 118/58 | HR 60 | Ht 67.0 in | Wt 156.0 lb

## 2013-12-10 DIAGNOSIS — N183 Chronic kidney disease, stage 3 unspecified: Secondary | ICD-10-CM

## 2013-12-10 DIAGNOSIS — E785 Hyperlipidemia, unspecified: Secondary | ICD-10-CM

## 2013-12-10 DIAGNOSIS — I2510 Atherosclerotic heart disease of native coronary artery without angina pectoris: Secondary | ICD-10-CM

## 2013-12-10 DIAGNOSIS — I10 Essential (primary) hypertension: Secondary | ICD-10-CM

## 2013-12-10 DIAGNOSIS — I429 Cardiomyopathy, unspecified: Secondary | ICD-10-CM

## 2013-12-10 NOTE — Assessment & Plan Note (Signed)
On Zocor, follows with Dr. Margo Aye.

## 2013-12-10 NOTE — Patient Instructions (Signed)
Your physician wants you to follow-up in: 4 months with Dr. McDowell You will receive a reminder letter in the mail two months in advance. If you don't receive a letter, please call our office to schedule the follow-up appointment.  Your physician recommends that you continue on your current medications as directed. Please refer to the Current Medication list given to you today.  Thank you for choosing [facility]!!    

## 2013-12-10 NOTE — Assessment & Plan Note (Signed)
Weight is down significantly and volume status improved. He has actually been taking his Lasix twice daily for an extended period of time. Followup BMET as dose may need to be adjusted.

## 2013-12-10 NOTE — Assessment & Plan Note (Signed)
History of nonobstructive disease, no active angina symptoms. 

## 2013-12-10 NOTE — Assessment & Plan Note (Signed)
Last creatinine 1.9 in January.

## 2013-12-10 NOTE — Progress Notes (Signed)
Clinical Summary [name] Rice is a 78 y.o.male last seen in May. Lasix was temporarily intensified at the last visit due to leg edema. Since then he has actually been taking Lasix twice a day with better control of his leg edema and chronic stasis. Reports stable dyspnea on exertion, his weight is also down nearly 20 pounds. No recent followup BMET. Last creatinine 1.9 in January with Dr. Kristian Covey.  Echocardiogram from June 2013 showed mild LVH with LVEF 55-60%, grade 1 diastolic dysfunction, mild aortic regurgitation, mild mitral regurgitation, mild tricuspid regurgitation, PASP 39 mm mercury.   No Known Allergies  Current Outpatient Prescriptions  Medication Sig Dispense Refill  . amLODipine (NORVASC) 10 MG tablet Take 1 tablet (10 mg total) by mouth daily.  90 tablet  3  . aspirin 81 MG tablet Take 81 mg by mouth daily.        . carvedilol (COREG) 6.25 MG tablet Take 1 tablet (6.25 mg total) by mouth 2 (two) times daily with a meal.  60 tablet  3  . Cholecalciferol (VITAMIN D3) 1000 UNITS CAPS Take 1 capsule by mouth daily.      . furosemide (LASIX) 80 MG tablet Take 1 tablet (80 mg total) by mouth daily.  90 tablet  3  . hydrALAZINE (APRESOLINE) 50 MG tablet Take 1 tablet (50 mg total) by mouth 3 (three) times daily.  90 tablet  11  . simvastatin (ZOCOR) 5 MG tablet Take 1 tablet (5 mg total) by mouth at bedtime.  90 tablet  1  . tiotropium (SPIRIVA) 18 MCG inhalation capsule Place 18 mcg into inhaler and inhale daily.        No current facility-administered medications for this visit.    Past Medical History  Diagnosis Date  . NICM (nonischemic cardiomyopathy)     LVEF 55% 3/11  . Coronary atherosclerosis of native coronary artery     Nonobstructive  . Essential hypertension, benign   . COPD (chronic obstructive pulmonary disease)   . Pulmonary hypertension   . Mitral regurgitation     Moderate  . Ventricular dysfunction, right   . Tricuspid regurgitation   . Lower  extremity edema   . GERD (gastroesophageal reflux disease)   . DJD (degenerative joint disease)   . Bradycardia   . Paroxysmal atrial fibrillation   . CKD (chronic kidney disease), stage III     Has had temporary dialysis in the past    Social History [name] Rice reports that he quit smoking about 12 years ago. His smoking use included Cigarettes. He has a 40 pack-year smoking history. He has never used smokeless tobacco. [name] Rice reports that he drinks alcohol.  Review of Systems No chest pain or palpitations. No cardiac hospitalizations. Stable appetite. Other systems reviewed and negative.  Physical Examination Filed Vitals:   12/10/13 1029  BP: 118/58  Pulse: 60   Filed Weights   12/10/13 1029  Weight: 156 lb (70.761 kg)    Chronically ill-appearing male in no acute distress, ambulates with walker.  HEENT: Conjunctiva and lids are normal, oropharynx clear with poor dentition.  Neck: Supple, no loud carotid bruits. Jugular venous pressure approximately 8 cm of water.  Lungs: Diminished breath sounds, otherwise clear without wheezing. Nonlabored breathing at rest - pursed lip breathing when he talks.  Cardiac: Regular rate and rhythm, indistinct PMI, no S3 gallop.  Abdomen: Protuberant, bowel sounds present, no tenderness.  Extremities: 1-2+ bilateral lower leg edema and significant venous stasis.  Skin: Warm  and dry.  Muscular skeletal: No kyphosis.  Neuropsychiatric: Alert and oriented x3, affect appropriate.   Problem List and Plan   Secondary cardiomyopathy Weight is down significantly and volume status improved. He has actually been taking his Lasix twice daily for an extended period of time. Followup BMET as dose may need to be adjusted.  CORONARY ATHEROSCLEROSIS NATIVE CORONARY ARTERY History of nonobstructive disease, no active angina symptoms.  CKD (chronic kidney disease) stage 3, GFR 30-59 ml/min Last creatinine 1.9 in January.  HYPERLIPIDEMIA On Zocor,  follows with Dr. Margo Aye.    Jonelle Sidle, M.D., F.A.C.C.

## 2013-12-17 ENCOUNTER — Telehealth: Payer: Self-pay

## 2013-12-17 LAB — BASIC METABOLIC PANEL
BUN: 44 mg/dL — ABNORMAL HIGH (ref 6–23)
CALCIUM: 9.3 mg/dL (ref 8.4–10.5)
CHLORIDE: 101 meq/L (ref 96–112)
CO2: 25 mEq/L (ref 19–32)
CREATININE: 1.81 mg/dL — AB (ref 0.50–1.35)
Glucose, Bld: 103 mg/dL — ABNORMAL HIGH (ref 70–99)
Potassium: 3.6 mEq/L (ref 3.5–5.3)
Sodium: 138 mEq/L (ref 135–145)

## 2013-12-17 MED ORDER — FUROSEMIDE 40 MG PO TABS: 60.0000 mg | ORAL_TABLET | Freq: Every day | ORAL | Status: DC

## 2013-12-17 NOTE — Telephone Encounter (Signed)
Message copied by Nori Riis on Thu Dec 17, 2013  9:06 AM ------      Message from: MCDOWELL, Illene Bolus      Created: Thu Dec 17, 2013  8:11 AM       Reviewed. Potassium normal. Creatinine has come up from 1.5-1.8, although has been higher in the past. Let's try and cut his Lasix to 60 mg twice a day from 80 mg twice a day. ------

## 2013-12-17 NOTE — Telephone Encounter (Signed)
Spoke with cousin [name] who sets up pt's meds and she will decrease lasix to 60 mg bid

## 2014-04-05 ENCOUNTER — Telehealth: Payer: Self-pay | Admitting: *Deleted

## 2014-04-05 MED ORDER — CARVEDILOL 6.25 MG PO TABS: 6.2500 mg | ORAL_TABLET | Freq: Two times a day (BID) | ORAL | Status: DC

## 2014-04-05 NOTE — Telephone Encounter (Signed)
[facility]  Fax for carvedilol 6.25 mg

## 2014-04-19 ENCOUNTER — Ambulatory Visit (INDEPENDENT_AMBULATORY_CARE_PROVIDER_SITE_OTHER): Payer: Medicare Other | Admitting: Cardiology

## 2014-04-19 ENCOUNTER — Encounter: Payer: Self-pay | Admitting: Cardiology

## 2014-04-19 VITALS — BP 120/78 | HR 70 | Ht 67.0 in | Wt 149.8 lb

## 2014-04-19 DIAGNOSIS — I429 Cardiomyopathy, unspecified: Secondary | ICD-10-CM

## 2014-04-19 DIAGNOSIS — N183 Chronic kidney disease, stage 3 unspecified: Secondary | ICD-10-CM

## 2014-04-19 DIAGNOSIS — I10 Essential (primary) hypertension: Secondary | ICD-10-CM

## 2014-04-19 NOTE — Progress Notes (Signed)
Reason for visit: Cardiomyopathy, cor pulmonale, CAD  Clinical Summary Mr. Kok is a 79 y.o.male last seen in September 2015. He is here for a follow-up visit. No major change in baseline shortness of breath, leg edema looks better, although it is never completely resolved. His weight is down compared to the last visit.  Lab work from September 2015 showed potassium 3.6, BUN 44, creatinine 1.8 up from 1.5. Lasix was cut to 60 mg twice daily. Continues to follow with Dr. Kristian Covey and reportedly had lab work in December 2015, to be requested.  Echocardiogram from June 2013 showed mild LVH with LVEF 55-60%, grade 1 diastolic dysfunction, mild aortic regurgitation, mild mitral regurgitation, mild tricuspid regurgitation, PASP 39 mm mercury.  No Known Allergies  Current Outpatient Prescriptions  Medication Sig Dispense Refill  . amLODipine (NORVASC) 10 MG tablet Take 1 tablet (10 mg total) by mouth daily. 90 tablet 3  . aspirin 81 MG tablet Take 81 mg by mouth daily.      . carvedilol (COREG) 6.25 MG tablet Take 1 tablet (6.25 mg total) by mouth 2 (two) times daily with a meal. 60 tablet 3  . Cholecalciferol (VITAMIN D3) 1000 UNITS CAPS Take 1 capsule by mouth daily.    . furosemide (LASIX) 80 MG tablet Take 80 mg by mouth daily.    . hydrALAZINE (APRESOLINE) 50 MG tablet Take 1 tablet (50 mg total) by mouth 3 (three) times daily. 90 tablet 11  . Multiple Vitamins-Minerals (VISION-VITE PRESERVE PO) Take by mouth.    . simvastatin (ZOCOR) 5 MG tablet Take 1 tablet (5 mg total) by mouth at bedtime. 90 tablet 1  . tiotropium (SPIRIVA) 18 MCG inhalation capsule Place 18 mcg into inhaler and inhale daily.      No current facility-administered medications for this visit.    Past Medical History  Diagnosis Date  . NICM (nonischemic cardiomyopathy)     LVEF 55% 3/11  . Coronary atherosclerosis of native coronary artery     Nonobstructive  . Essential hypertension, benign   . COPD (chronic  obstructive pulmonary disease)   . Pulmonary hypertension   . Mitral regurgitation     Moderate  . Ventricular dysfunction, right   . Tricuspid regurgitation   . Lower extremity edema   . GERD (gastroesophageal reflux disease)   . DJD (degenerative joint disease)   . Bradycardia   . Paroxysmal atrial fibrillation   . CKD (chronic kidney disease), stage III     Has had temporary dialysis in the past    Social History Mr. Berrones reports that he quit smoking about 13 years ago. His smoking use included Cigarettes. He started smoking about 53 years ago. He has a 40 pack-year smoking history. He has never used smokeless tobacco. Mr. Troost reports that he drinks alcohol.  Review of Systems Complete review of systems negative except as otherwise outlined in the clinical summary and also the following. No fevers or chills. Stable appetite. No chest pain.  Physical Examination Filed Vitals:   04/19/14 1041  BP: 120/78  Pulse: 70    Wt Readings from Last 3 Encounters:  04/19/14 149 lb 12.8 oz (67.949 kg)  12/10/13 156 lb (70.761 kg)  08/05/13 178 lb (80.74 kg)    Chronically ill-appearing male in no acute distress, ambulates with walker.  HEENT: Conjunctiva and lids are normal, oropharynx clear with poor dentition.  Neck: Supple, no loud carotid bruits. Jugular venous pressure approximately 8 cm of water.  Lungs: Diminished breath  sounds, otherwise clear without wheezing. Nonlabored breathing at rest - pursed lip breathing when he talks.  Cardiac: Regular rate and rhythm, indistinct PMI, no S3 gallop.  Abdomen: Protuberant, bowel sounds present, no tenderness.  Extremities: 1-2+ bilateral lower leg edema and significant venous stasis.  Skin: Warm and dry.  Muscular skeletal: No kyphosis.  Neuropsychiatric: Alert and oriented x3, affect appropriate.   Problem List and Plan   Secondary cardiomyopathy Normal LVEF with diastolic dysfunction and cor pulmonale. He has had  good diuresis and weight loss on present dose of Lasix. Will request most recent BMET for review. Otherwise no changes for now.   Essential hypertension, benign Blood pressure control is good today.   CKD (chronic kidney disease) stage 3, GFR 30-59 ml/min Last creatinine was 1.8, follow-up lab work to be requested.     Jonelle Sidle, M.D., F.A.C.C.

## 2014-04-19 NOTE — Assessment & Plan Note (Signed)
Last creatinine was 1.8, follow-up lab work to be requested.

## 2014-04-19 NOTE — Assessment & Plan Note (Signed)
Normal LVEF with diastolic dysfunction and cor pulmonale. He has had good diuresis and weight loss on present dose of Lasix. Will request most recent BMET for review. Otherwise no changes for now.

## 2014-04-19 NOTE — Assessment & Plan Note (Signed)
Blood pressure control is good today. 

## 2014-04-19 NOTE — Patient Instructions (Signed)
Your physician wants you to follow-up in: 4 months with Dr.McDowell You will receive a reminder letter in the mail two months in advance. If you don't receive a letter, please call our office to schedule the follow-up appointment.    Your physician recommends that you continue on your current medications as directed. Please refer to the Current Medication list given to you today.     Thank you for choosing [facility] !    

## 2014-04-20 ENCOUNTER — Encounter: Payer: Self-pay | Admitting: Cardiology

## 2014-04-22 ENCOUNTER — Other Ambulatory Visit: Payer: Self-pay | Admitting: Cardiology

## 2014-04-22 MED ORDER — SIMVASTATIN 5 MG PO TABS: 5.0000 mg | ORAL_TABLET | Freq: Every day | ORAL | Status: DC

## 2014-04-22 NOTE — Telephone Encounter (Signed)
escribed refill per request 

## 2014-04-22 NOTE — Telephone Encounter (Signed)
Received fax refill request  Rx # [id number] Medication:  Simvastatin 5 mg tablet Qty 90 Sig:  Take one tablet by mouth at bedtime Physician:  Diona Browner

## 2014-05-16 IMAGING — CR DG CHEST 2V
2 series · 2 of 2 positions shown · non-contrast
Comparison: 01/19/2008 and earlier.

CLINICAL DATA: 76-year-old male with cough, obstructive pulmonary
disease.  Question CHF.

CHEST - 2 VIEW

[w chest pa]
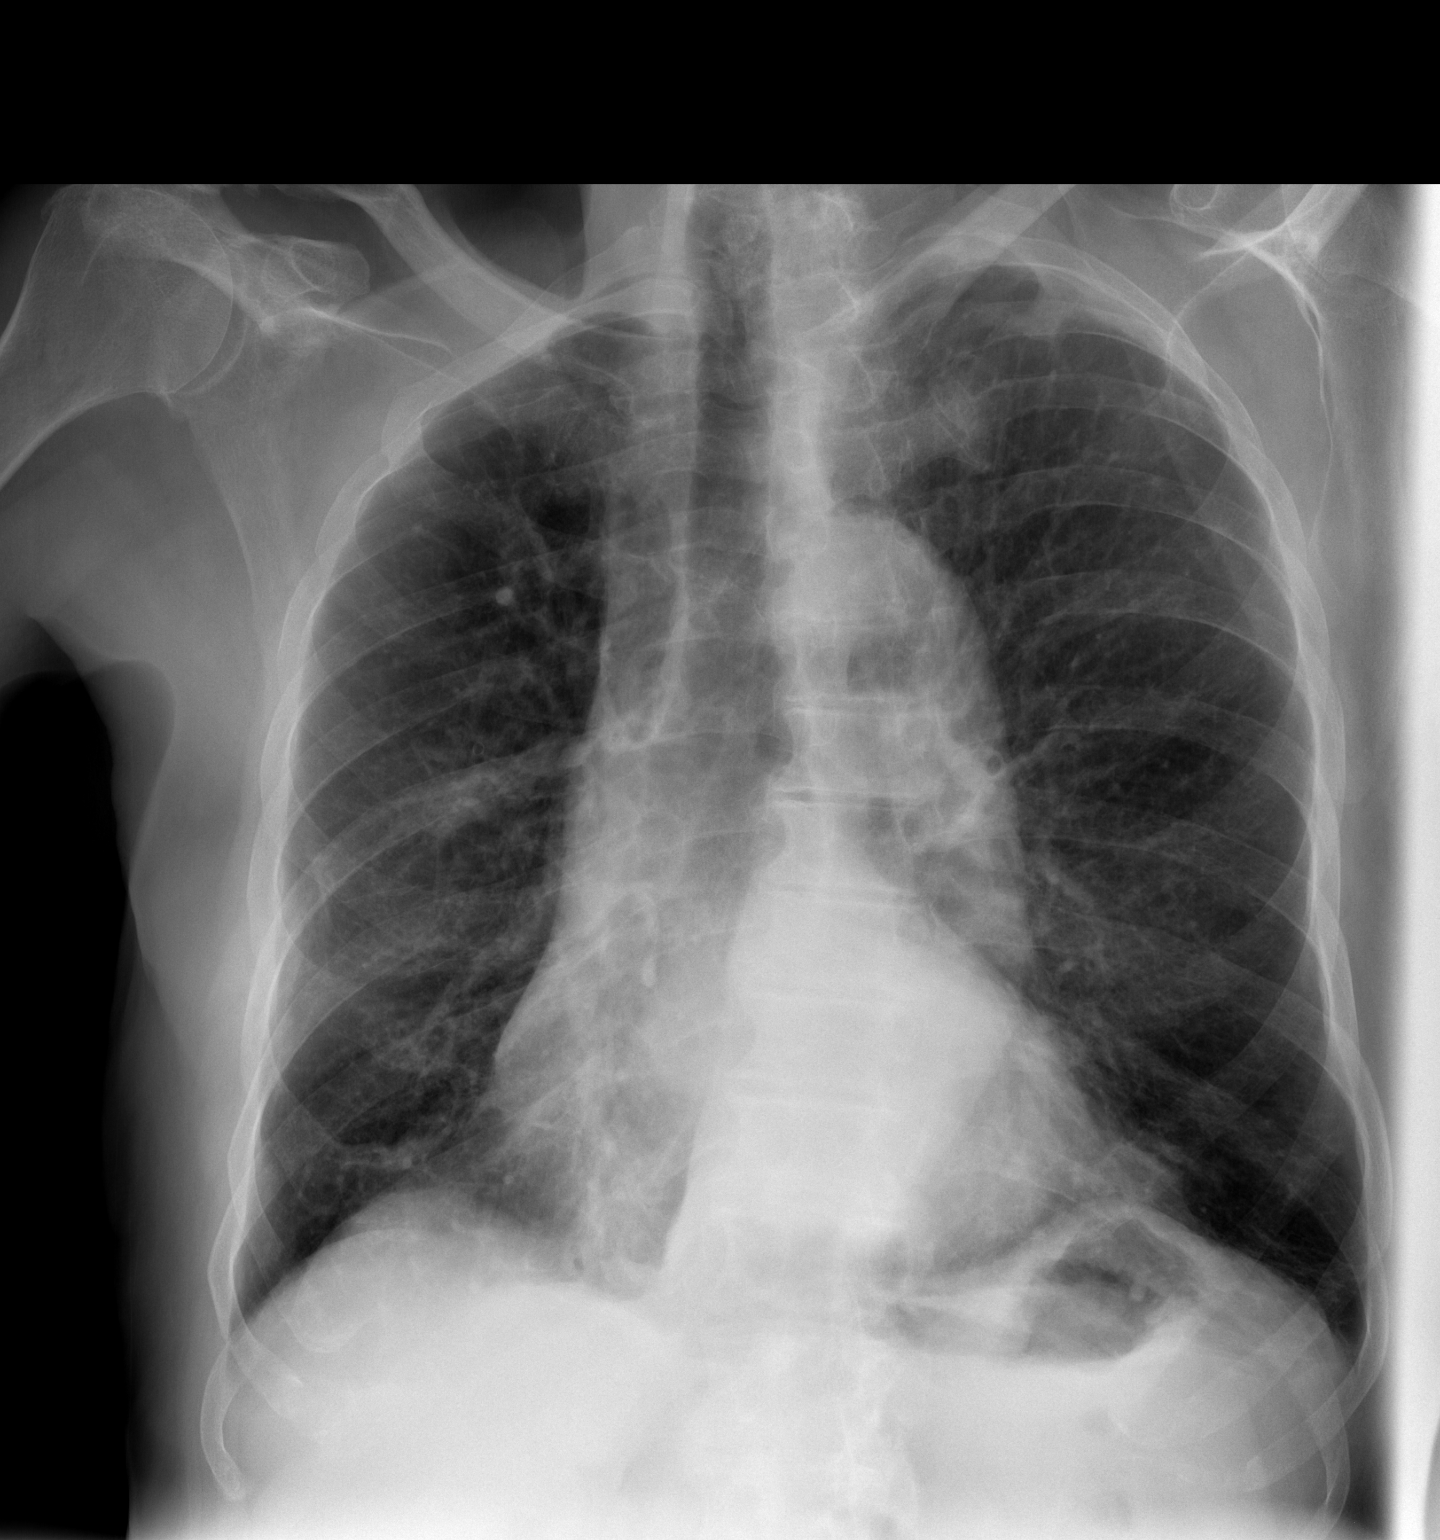

[w chest lat]
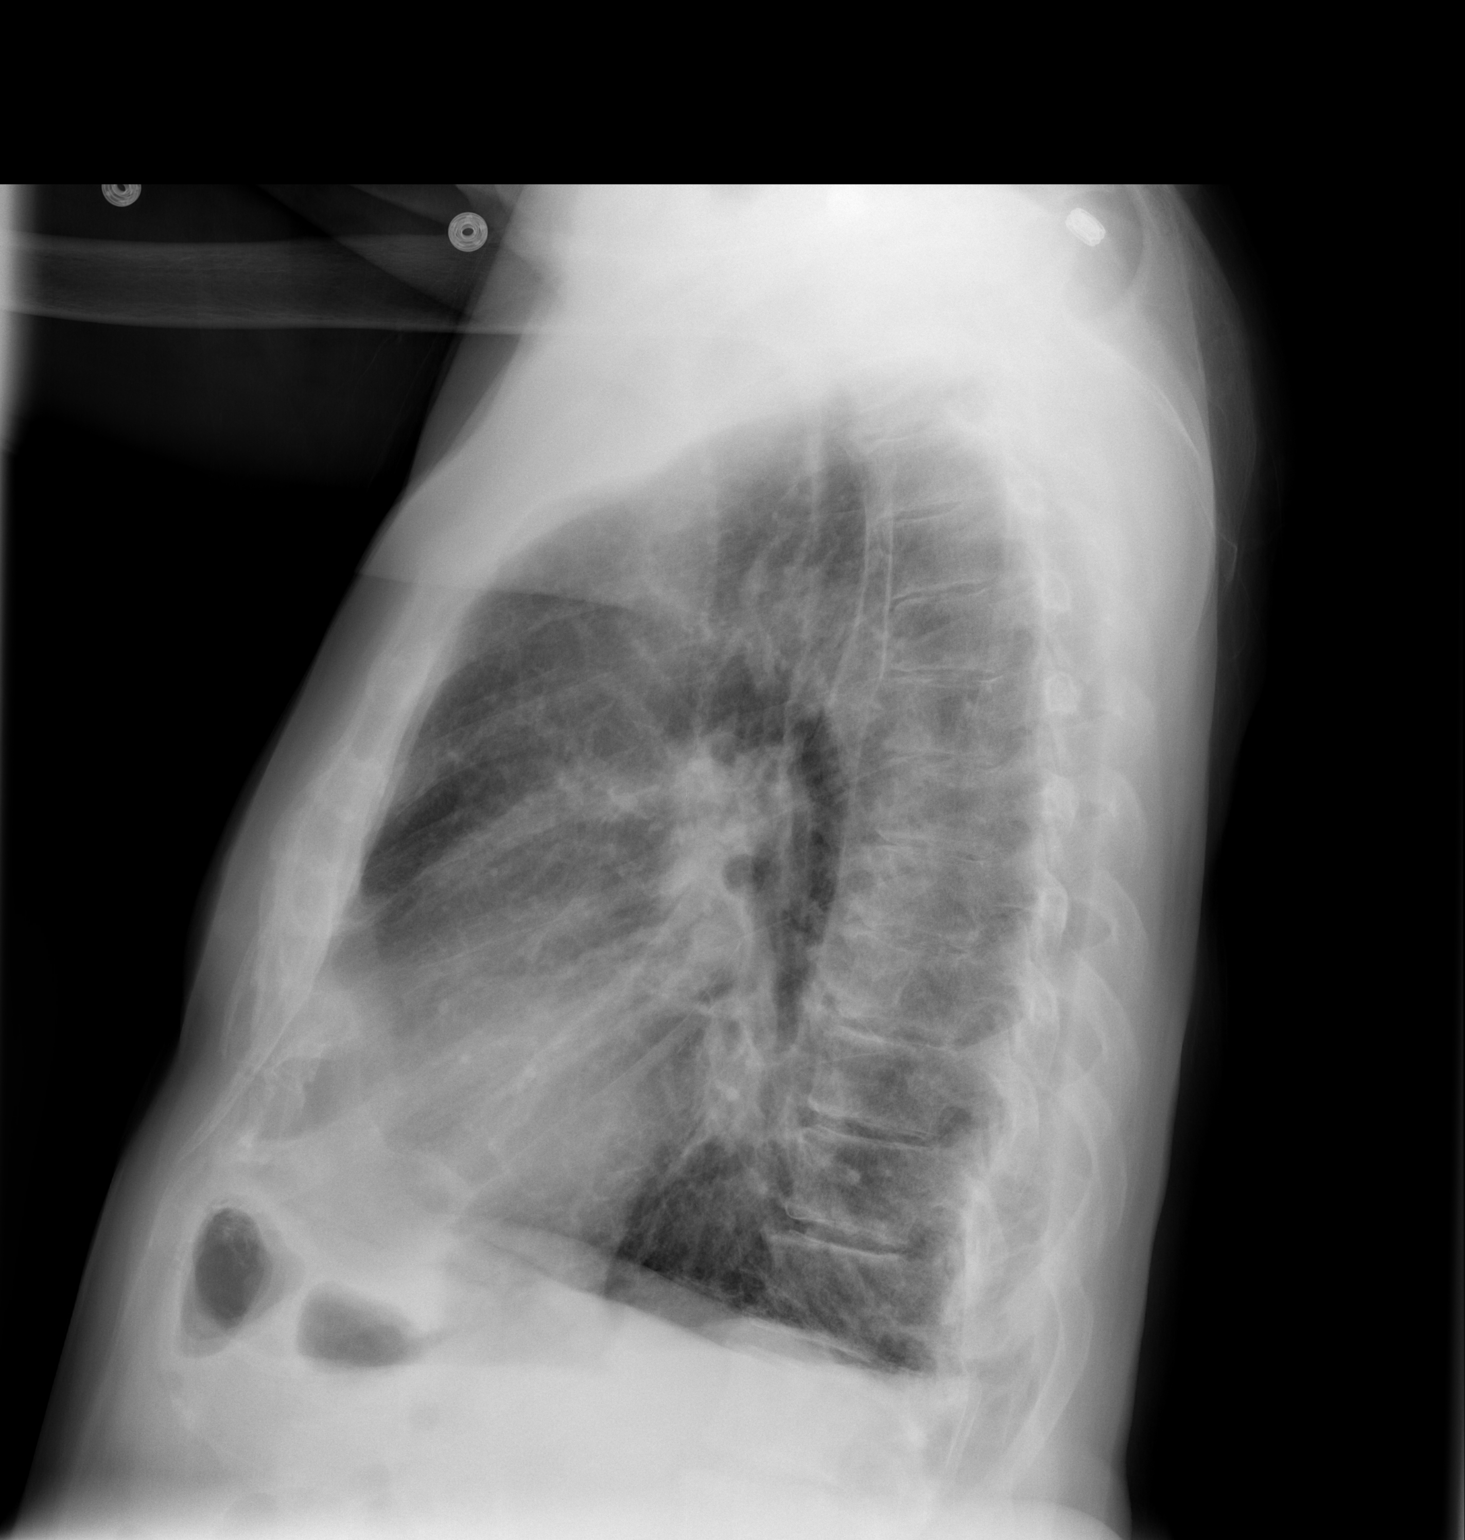

[2 of 2 positions shown; findings below may reference images not displayed]

FINDINGS: Chronic large lung volumes.  Cardiac size is stable at
the upper limits of normal.  Chronic tortuous thoracic aorta. Other
mediastinal contours are within normal limits.  Visualized tracheal
air column is within normal limits.  No pneumothorax, pulmonary
edema, pleural effusion or confluent pulmonary opacity.

There is chronic apical scarring.  Asymmetry in the left lung apex
appears increased from prior exams, but significance is unclear. No
acute osseous abnormality identified.
IMPRESSION: 1.  No acute pneumonia or evidence of congestive heart failure
identified.
2.  Chronic lung disease.  Asymmetric opacity in the left apex has
increased since 8558.  This may be related to scarring, but
recommend follow-up chest CT (IV contrast preferred) to
characterize further.

These results will be called to the ordering clinician or
representative by the Radiologist Assistant, and communication
documented in the PACS Dashboard.

## 2014-05-17 IMAGING — CR DG KNEE COMPLETE 4+V*L*
4 series · 4 of 4 positions shown · non-contrast
Comparison: 08/23/2011

CLINICAL DATA: Tibial fracture.

LEFT KNEE - COMPLETE 4+ VIEW

[x knee ap left]
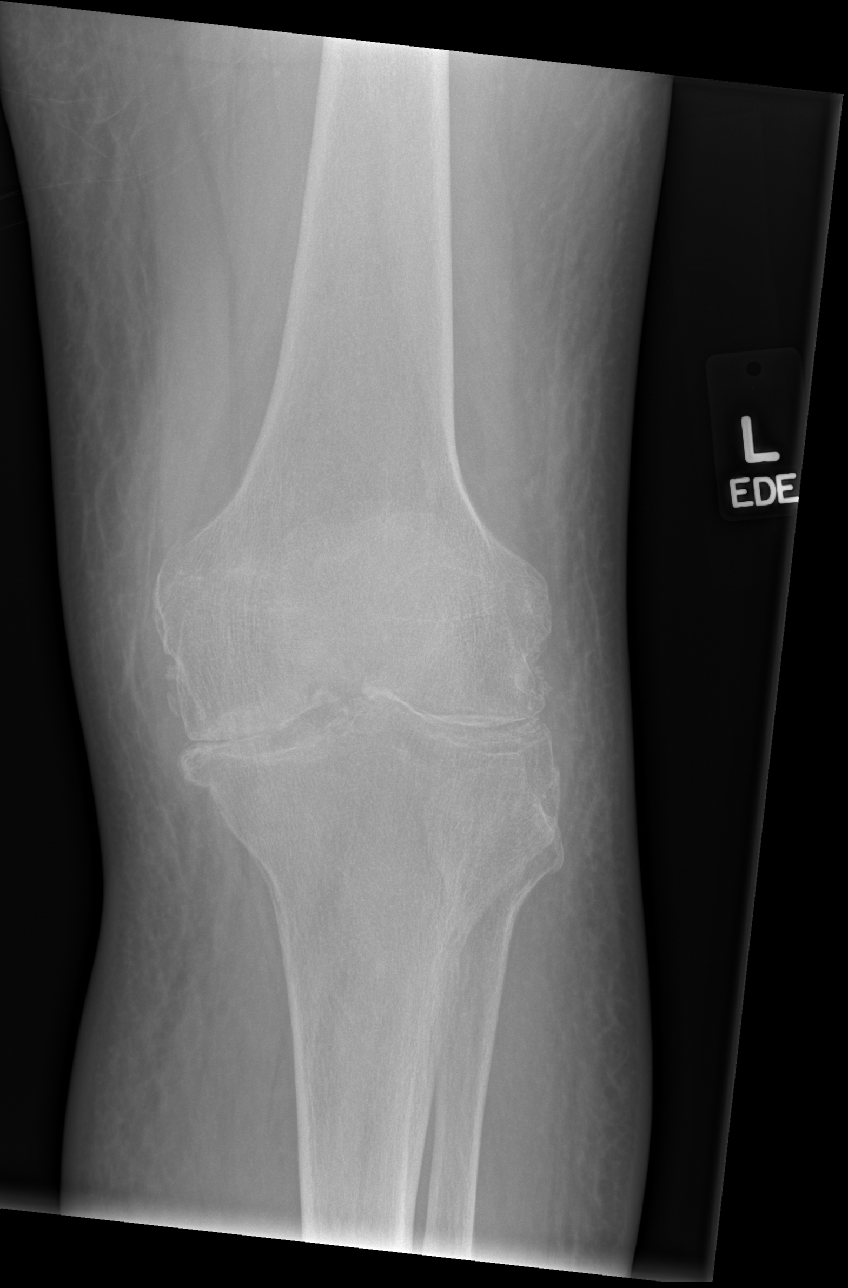

[x knee obl left (1 of 2)]
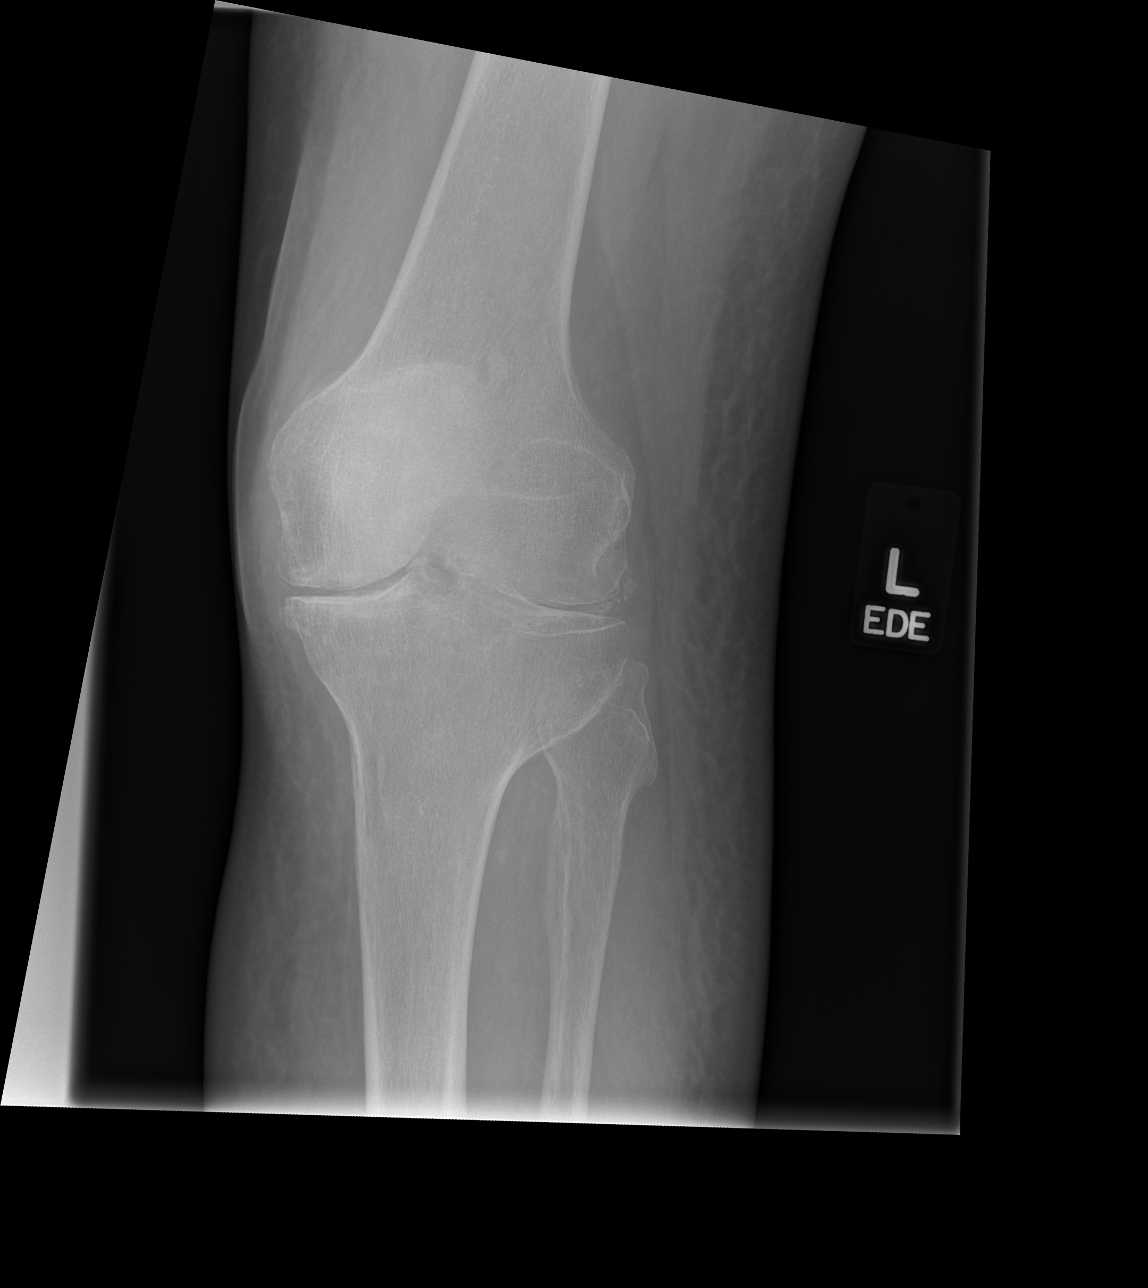

[x knee obl left (2 of 2)]
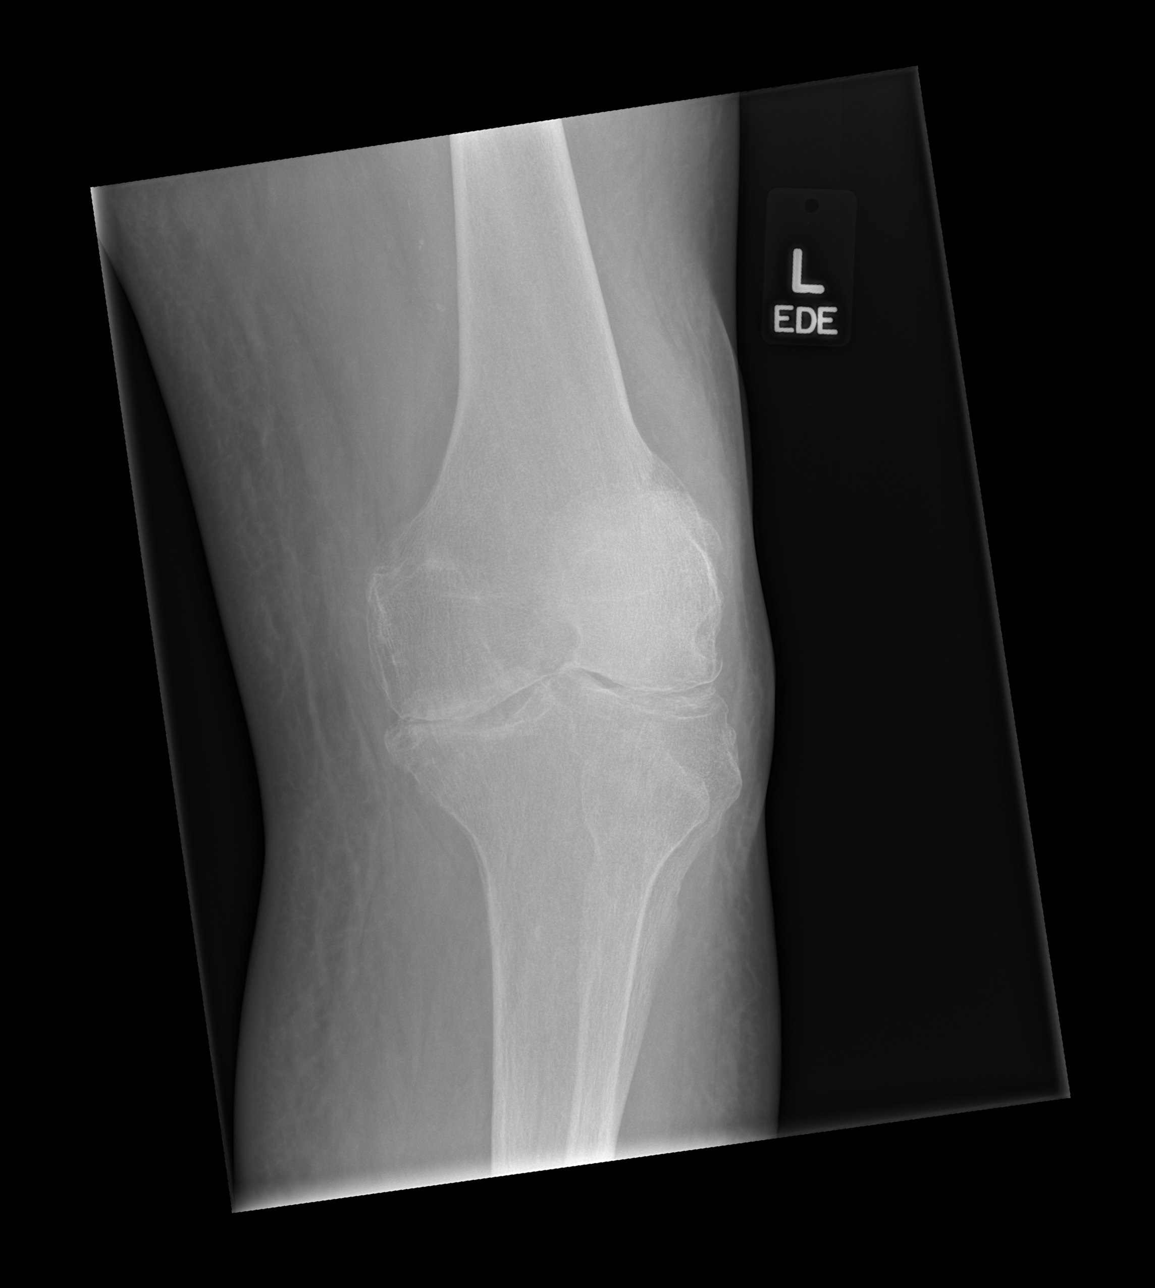

[x knee lat left]
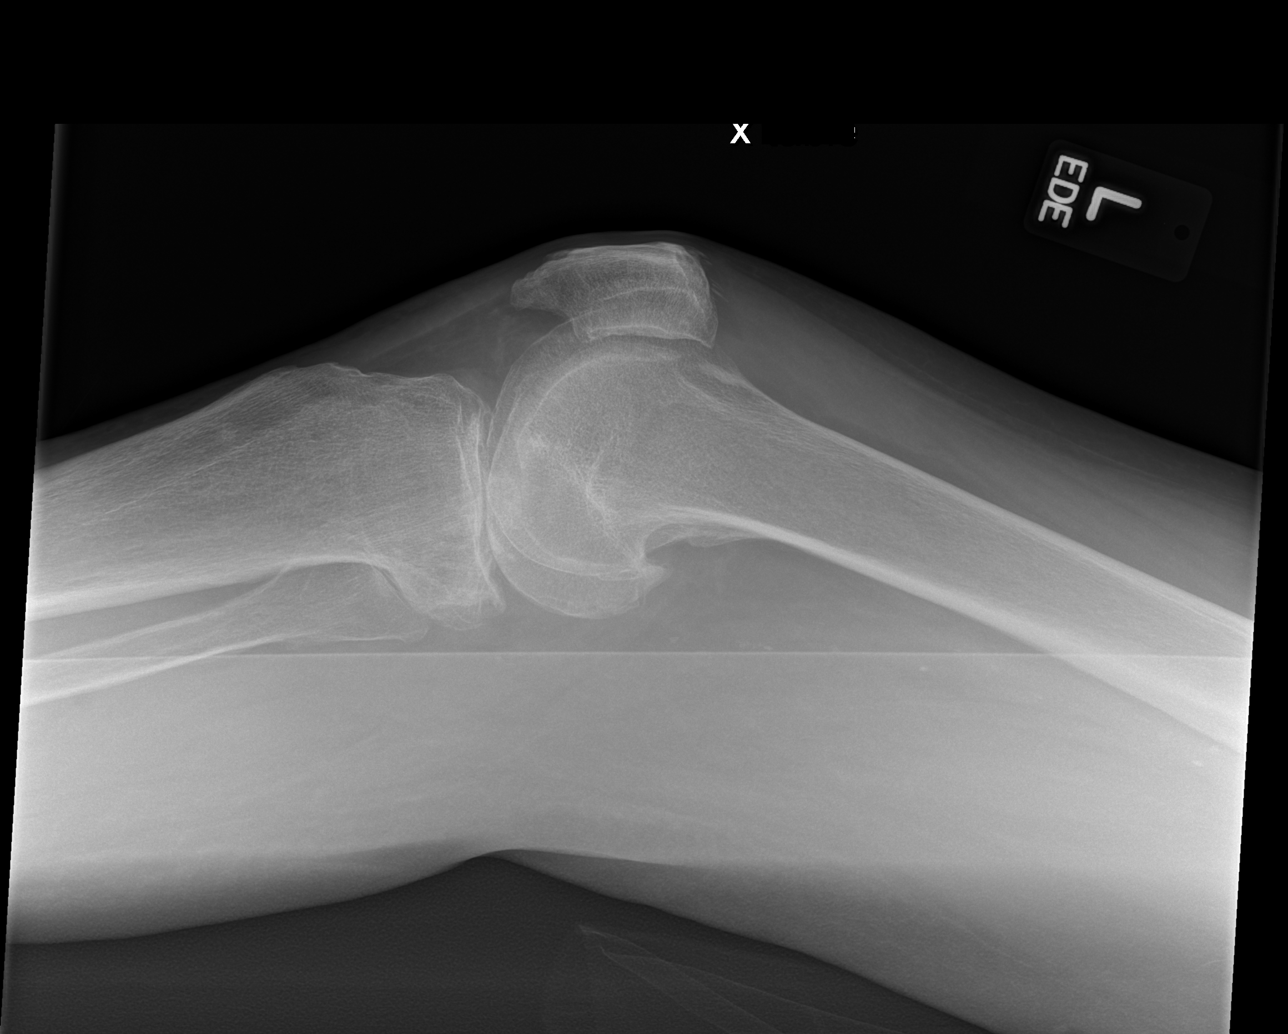

[4 of 4 positions shown; findings below may reference images not displayed]

FINDINGS: No joint effusion.

Severe tricompartment osteoarthritis noted.

Slightly depressed medial tibial plateau fracture is identified.
This is better illustrated on recent MRI.
IMPRESSION: 1.  Medial plateau fracture which is better seen on MRI from
08/23/2011.
[DATE].  Severe tricompartment osteoarthritis.

## 2014-05-20 ENCOUNTER — Telehealth: Payer: Self-pay | Admitting: *Deleted

## 2014-05-20 MED ORDER — AMLODIPINE BESYLATE 10 MG PO TABS: 10.0000 mg | ORAL_TABLET | Freq: Every day | ORAL | Status: DC

## 2014-05-20 NOTE — Telephone Encounter (Signed)
Refill request from [facility] [place] for amlodipine 10 mg. Medication sent to pharmacy.

## 2014-06-22 DIAGNOSIS — H2512 Age-related nuclear cataract, left eye: Secondary | ICD-10-CM | POA: Diagnosis not present

## 2014-06-30 DIAGNOSIS — N183 Chronic kidney disease, stage 3 (moderate): Secondary | ICD-10-CM | POA: Diagnosis not present

## 2014-06-30 DIAGNOSIS — D509 Iron deficiency anemia, unspecified: Secondary | ICD-10-CM | POA: Diagnosis not present

## 2014-06-30 DIAGNOSIS — Z79899 Other long term (current) drug therapy: Secondary | ICD-10-CM | POA: Diagnosis not present

## 2014-06-30 DIAGNOSIS — R809 Proteinuria, unspecified: Secondary | ICD-10-CM | POA: Diagnosis not present

## 2014-06-30 DIAGNOSIS — I10 Essential (primary) hypertension: Secondary | ICD-10-CM | POA: Diagnosis not present

## 2014-06-30 DIAGNOSIS — E559 Vitamin D deficiency, unspecified: Secondary | ICD-10-CM | POA: Diagnosis not present

## 2014-07-07 DIAGNOSIS — R809 Proteinuria, unspecified: Secondary | ICD-10-CM | POA: Diagnosis not present

## 2014-07-07 DIAGNOSIS — D510 Vitamin B12 deficiency anemia due to intrinsic factor deficiency: Secondary | ICD-10-CM | POA: Diagnosis not present

## 2014-07-07 DIAGNOSIS — I10 Essential (primary) hypertension: Secondary | ICD-10-CM | POA: Diagnosis not present

## 2014-07-07 DIAGNOSIS — N183 Chronic kidney disease, stage 3 (moderate): Secondary | ICD-10-CM | POA: Diagnosis not present

## 2014-07-19 DIAGNOSIS — D519 Vitamin B12 deficiency anemia, unspecified: Secondary | ICD-10-CM | POA: Diagnosis not present

## 2014-07-23 DIAGNOSIS — Z79899 Other long term (current) drug therapy: Secondary | ICD-10-CM | POA: Diagnosis not present

## 2014-07-23 DIAGNOSIS — N183 Chronic kidney disease, stage 3 (moderate): Secondary | ICD-10-CM | POA: Diagnosis not present

## 2014-07-23 DIAGNOSIS — I10 Essential (primary) hypertension: Secondary | ICD-10-CM | POA: Diagnosis not present

## 2014-08-02 DIAGNOSIS — H25012 Cortical age-related cataract, left eye: Secondary | ICD-10-CM | POA: Diagnosis not present

## 2014-08-02 DIAGNOSIS — H01003 Unspecified blepharitis right eye, unspecified eyelid: Secondary | ICD-10-CM | POA: Diagnosis not present

## 2014-08-02 DIAGNOSIS — H35033 Hypertensive retinopathy, bilateral: Secondary | ICD-10-CM | POA: Diagnosis not present

## 2014-08-02 DIAGNOSIS — H524 Presbyopia: Secondary | ICD-10-CM | POA: Diagnosis not present

## 2014-08-02 DIAGNOSIS — H2512 Age-related nuclear cataract, left eye: Secondary | ICD-10-CM | POA: Diagnosis not present

## 2014-08-10 DIAGNOSIS — Z79899 Other long term (current) drug therapy: Secondary | ICD-10-CM | POA: Diagnosis not present

## 2014-08-10 DIAGNOSIS — N183 Chronic kidney disease, stage 3 (moderate): Secondary | ICD-10-CM | POA: Diagnosis not present

## 2014-08-10 DIAGNOSIS — I10 Essential (primary) hypertension: Secondary | ICD-10-CM | POA: Diagnosis not present

## 2014-08-12 DIAGNOSIS — I10 Essential (primary) hypertension: Secondary | ICD-10-CM | POA: Diagnosis not present

## 2014-08-12 DIAGNOSIS — N183 Chronic kidney disease, stage 3 (moderate): Secondary | ICD-10-CM | POA: Diagnosis not present

## 2014-08-12 DIAGNOSIS — N179 Acute kidney failure, unspecified: Secondary | ICD-10-CM | POA: Diagnosis not present

## 2014-08-17 ENCOUNTER — Other Ambulatory Visit: Payer: Self-pay | Admitting: Cardiology

## 2014-08-17 MED ORDER — CARVEDILOL 6.25 MG PO TABS: 6.2500 mg | ORAL_TABLET | Freq: Two times a day (BID) | ORAL | Status: DC

## 2014-08-17 NOTE — Telephone Encounter (Signed)
Received fax refill request  Rx # [id number] Medication:  Carvedilol 6.25mg  tablet Qty 60 Sig:  Take one tablet by mouth twice daily Physician:  Diona Browner

## 2014-08-19 ENCOUNTER — Ambulatory Visit (INDEPENDENT_AMBULATORY_CARE_PROVIDER_SITE_OTHER): Payer: Medicare Other | Admitting: Cardiology

## 2014-08-19 ENCOUNTER — Encounter: Payer: Self-pay | Admitting: Cardiology

## 2014-08-19 VITALS — BP 128/62 | HR 100 | Ht 67.0 in | Wt 143.6 lb

## 2014-08-19 DIAGNOSIS — I429 Cardiomyopathy, unspecified: Secondary | ICD-10-CM | POA: Diagnosis not present

## 2014-08-19 DIAGNOSIS — N183 Chronic kidney disease, stage 3 unspecified: Secondary | ICD-10-CM

## 2014-08-19 DIAGNOSIS — I10 Essential (primary) hypertension: Secondary | ICD-10-CM | POA: Diagnosis not present

## 2014-08-19 NOTE — Progress Notes (Signed)
Cardiology Office Note  Date: 08/19/2014   ID: [name] Rice, DOB Oct 05, 1935, MRN [id number]  PCP: Catalina Pizza, MD  Primary Cardiologist: Nona Dell, MD   Chief Complaint  Patient presents with  . Cardiomyopathy    History of Present Illness: [name] Rice is a 79 y.o. male last seen in February. He presents for a routine follow-up visit. Weight is down compared to the last visit, however he tells me that he was temporarily off Lasix per his nephrologist, and had gained back a significant amount of weight. He has been back on Lasix since last week. Records are being requested. Otherwise, he does not endorse any chest pain or palpitations.  I reviewed his medications, he is now also off Norvasc. Blood pressure today is normal.   Past Medical History  Diagnosis Date  . NICM (nonischemic cardiomyopathy)     LVEF 55% 3/11  . Coronary atherosclerosis of native coronary artery     Nonobstructive  . Essential hypertension, benign   . COPD (chronic obstructive pulmonary disease)   . Pulmonary hypertension   . Mitral regurgitation     Moderate  . Ventricular dysfunction, right   . Tricuspid regurgitation   . Lower extremity edema   . GERD (gastroesophageal reflux disease)   . DJD (degenerative joint disease)   . Bradycardia   . Paroxysmal atrial fibrillation   . CKD (chronic kidney disease), stage III     Has had temporary dialysis in the past     Current Outpatient Prescriptions  Medication Sig Dispense Refill  . aspirin 81 MG tablet Take 81 mg by mouth daily.      . carvedilol (COREG) 6.25 MG tablet Take 1 tablet (6.25 mg total) by mouth 2 (two) times daily with a meal. 60 tablet 3  . Cholecalciferol (VITAMIN D3) 1000 UNITS CAPS Take 1 capsule by mouth daily.    . cyanocobalamin (,VITAMIN B-12,) 1000 MCG/ML injection   0  . furosemide (LASIX) 80 MG tablet Take 80 mg by mouth daily.    . hydrALAZINE (APRESOLINE) 50 MG tablet Take 1 tablet (50 mg total) by mouth 3  (three) times daily. 90 tablet 11  . Multiple Vitamins-Minerals (VISION-VITE PRESERVE PO) Take by mouth.    . simvastatin (ZOCOR) 5 MG tablet Take 1 tablet (5 mg total) by mouth at bedtime. 90 tablet 1  . tiotropium (SPIRIVA) 18 MCG inhalation capsule Place 18 mcg into inhaler and inhale daily.      No current facility-administered medications for this visit.    Allergies:  Review of patient's allergies indicates no known allergies.   Social History: The patient  reports that he quit smoking about 13 years ago. His smoking use included Cigarettes. He started smoking about 53 years ago. He has a 40 pack-year smoking history. He has never used smokeless tobacco. He reports that he drinks alcohol. He reports that he does not use illicit drugs.   ROS:  Please see the history of present illness. Otherwise, complete review of systems is positive for none.  All other systems are reviewed and negative.   Physical Exam: VS:  BP 128/62 mmHg  Pulse 100  Ht 5\' 7"  (1.702 m)  Wt 143 lb 9.6 oz (65.137 kg)  BMI 22.49 kg/m2  SpO2 95%, BMI Body mass index is 22.49 kg/(m^2).  Wt Readings from Last 3 Encounters:  08/19/14 143 lb 9.6 oz (65.137 kg)  04/19/14 149 lb 12.8 oz (67.949 kg)  12/10/13 156 lb (70.761 kg)  Chronically ill-appearing male in no acute distress, ambulates with walker.  HEENT: Conjunctiva and lids are normal, oropharynx clear with poor dentition.  Neck: Supple, no loud carotid bruits. Jugular venous pressure approximately 8 cm of water.  Lungs: Diminished breath sounds, otherwise clear without wheezing. Nonlabored breathing at rest - pursed lip breathing when he talks.  Cardiac: Regular rate and rhythm, indistinct PMI, no S3 gallop.  Abdomen: Protuberant, bowel sounds present, no tenderness.  Extremities: 1-2+ bilateral lower leg edema and significant venous stasis.  Skin: Warm and dry.  Muscular skeletal: No kyphosis.  Neuropsychiatric: Alert and oriented x3, affect  appropriate.   ECG: ECG is not ordered today.  Recent Labwork:  02/25/2014: Hemoglobin 10.1, potassium 4.4, BUN 28, creatinine 1.4  Other Studies Reviewed Today:  Echocardiogram from June 2013 showed mild LVH with LVEF 55-60%, grade 1 diastolic dysfunction, mild aortic regurgitation, mild mitral regurgitation, mild tricuspid regurgitation, PASP 39 mm mercury.   ASSESSMENT AND PLAN:  1. Nonischemic cardiomyopathy with normalization of LVEF as of last testing but associated diastolic dysfunction and also cor pulmonale. Plan is to continue current cardiac regimen. Blood pressure is normal today. Will request most recently records from nephrology.  2. Essential hypertension, blood pressure is normal today.  3. History of CKD, stage 3.  Current medicines were reviewed at length with the patient today.   Disposition: FU with me in 3 months.   Signed, Jonelle Sidle, MD, [facility] 08/19/2014 10:54 AM    [facility] at [facility] [address], [place], [place] [address] Phone: [phone]; Fax: [phone]

## 2014-08-19 NOTE — Patient Instructions (Signed)
Your physician recommends that you schedule a follow-up appointment in: 3 months with Dr. Diona Browner  Your physician recommends that you continue on your current medications as directed. Please refer to the Current Medication list given to you today.  Thanks for choosing [facility]!!!

## 2014-08-20 DIAGNOSIS — D519 Vitamin B12 deficiency anemia, unspecified: Secondary | ICD-10-CM | POA: Diagnosis not present

## 2014-08-24 DIAGNOSIS — H2512 Age-related nuclear cataract, left eye: Secondary | ICD-10-CM | POA: Diagnosis not present

## 2014-09-14 DIAGNOSIS — Z961 Presence of intraocular lens: Secondary | ICD-10-CM | POA: Diagnosis not present

## 2014-09-21 DIAGNOSIS — D519 Vitamin B12 deficiency anemia, unspecified: Secondary | ICD-10-CM | POA: Diagnosis not present

## 2014-10-22 DIAGNOSIS — D519 Vitamin B12 deficiency anemia, unspecified: Secondary | ICD-10-CM | POA: Diagnosis not present

## 2014-10-26 ENCOUNTER — Other Ambulatory Visit: Payer: Self-pay | Admitting: *Deleted

## 2014-10-26 MED ORDER — SIMVASTATIN 5 MG PO TABS: 5.0000 mg | ORAL_TABLET | Freq: Every day | ORAL | Status: DC

## 2014-11-02 DIAGNOSIS — N183 Chronic kidney disease, stage 3 (moderate): Secondary | ICD-10-CM | POA: Diagnosis not present

## 2014-11-02 DIAGNOSIS — D509 Iron deficiency anemia, unspecified: Secondary | ICD-10-CM | POA: Diagnosis not present

## 2014-11-02 DIAGNOSIS — R809 Proteinuria, unspecified: Secondary | ICD-10-CM | POA: Diagnosis not present

## 2014-11-02 DIAGNOSIS — I10 Essential (primary) hypertension: Secondary | ICD-10-CM | POA: Diagnosis not present

## 2014-11-02 DIAGNOSIS — E559 Vitamin D deficiency, unspecified: Secondary | ICD-10-CM | POA: Diagnosis not present

## 2014-11-02 DIAGNOSIS — Z79899 Other long term (current) drug therapy: Secondary | ICD-10-CM | POA: Diagnosis not present

## 2014-11-10 DIAGNOSIS — R809 Proteinuria, unspecified: Secondary | ICD-10-CM | POA: Diagnosis not present

## 2014-11-10 DIAGNOSIS — I509 Heart failure, unspecified: Secondary | ICD-10-CM | POA: Diagnosis not present

## 2014-11-10 DIAGNOSIS — N183 Chronic kidney disease, stage 3 (moderate): Secondary | ICD-10-CM | POA: Diagnosis not present

## 2014-11-10 DIAGNOSIS — D649 Anemia, unspecified: Secondary | ICD-10-CM | POA: Diagnosis not present

## 2014-11-19 ENCOUNTER — Ambulatory Visit (INDEPENDENT_AMBULATORY_CARE_PROVIDER_SITE_OTHER): Payer: Medicare Other | Admitting: Cardiology

## 2014-11-19 ENCOUNTER — Encounter: Payer: Self-pay | Admitting: Cardiology

## 2014-11-19 VITALS — BP 124/66 | HR 72 | Ht 67.0 in | Wt 136.0 lb

## 2014-11-19 DIAGNOSIS — I429 Cardiomyopathy, unspecified: Secondary | ICD-10-CM

## 2014-11-19 DIAGNOSIS — I10 Essential (primary) hypertension: Secondary | ICD-10-CM | POA: Diagnosis not present

## 2014-11-19 DIAGNOSIS — I5032 Chronic diastolic (congestive) heart failure: Secondary | ICD-10-CM

## 2014-11-19 DIAGNOSIS — D519 Vitamin B12 deficiency anemia, unspecified: Secondary | ICD-10-CM | POA: Diagnosis not present

## 2014-11-19 NOTE — Patient Instructions (Signed)
Your physician recommends that you schedule a follow-up appointment in: 3 months Dr McDowell    Your physician recommends that you continue on your current medications as directed. Please refer to the Current Medication list given to you today.     Thank you for choosing [facility] !         

## 2014-11-19 NOTE — Progress Notes (Signed)
Cardiology Office Note  Date: 11/19/2014   ID: JABRELL RACZKOWSKI, DOB December 28, 1935, MRN [id number]  PCP: Catalina Pizza, MD  Primary Cardiologist: Nona Dell, MD   Chief Complaint  Patient presents with  . Cardiomyopathy    History of Present Illness: [name] Rice is a 79 y.o. male last seen in June. He presents for a follow-up visit. He reports no overall change in stamina or appetite. He is sedentary and uses a walker as before. Weight is down approximately 7 pounds since last evaluation. He has been on a consistent dose of Lasix and reports having recent lab work with Dr. Kristian Covey.  Previous lab work from May of this year showed creatinine down to 1.2 with potassium of 4.4.  ECG today shows sinus rhythm with increased voltage and nonspecific ST changes.   Past Medical History  Diagnosis Date  . NICM (nonischemic cardiomyopathy)     LVEF 55% 3/11  . Coronary atherosclerosis of native coronary artery     Nonobstructive  . Essential hypertension, benign   . COPD (chronic obstructive pulmonary disease)   . Pulmonary hypertension   . Mitral regurgitation     Moderate  . Ventricular dysfunction, right   . Tricuspid regurgitation   . Lower extremity edema   . GERD (gastroesophageal reflux disease)   . DJD (degenerative joint disease)   . Bradycardia   . Paroxysmal atrial fibrillation   . CKD (chronic kidney disease), stage III     Has had temporary dialysis in the past     Current Outpatient Prescriptions  Medication Sig Dispense Refill  . aspirin 81 MG tablet Take 81 mg by mouth daily.      . carvedilol (COREG) 6.25 MG tablet Take 1 tablet (6.25 mg total) by mouth 2 (two) times daily with a meal. 60 tablet 3  . Cholecalciferol (VITAMIN D3) 1000 UNITS CAPS Take 1 capsule by mouth daily.    . cyanocobalamin (,VITAMIN B-12,) 1000 MCG/ML injection   0  . furosemide (LASIX) 40 MG tablet Take 40 mg by mouth daily.    . hydrALAZINE (APRESOLINE) 50 MG tablet Take 1 tablet (50  mg total) by mouth 3 (three) times daily. 90 tablet 11  . Multiple Vitamins-Minerals (VISION-VITE PRESERVE PO) Take by mouth.    . simvastatin (ZOCOR) 5 MG tablet Take 1 tablet (5 mg total) by mouth at bedtime. 90 tablet 1  . tiotropium (SPIRIVA) 18 MCG inhalation capsule Place 18 mcg into inhaler and inhale daily.      No current facility-administered medications for this visit.    Allergies:  Review of patient's allergies indicates no known allergies.   Social History: The patient  reports that he quit smoking about 13 years ago. His smoking use included Cigarettes. He started smoking about 53 years ago. He has a 40 pack-year smoking history. He has never used smokeless tobacco. He reports that he drinks alcohol. He reports that he does not use illicit drugs.   ROS:  Please see the history of present illness. Otherwise, complete review of systems is positive for decreased hearing, chronic leg edema that has been stable.  All other systems are reviewed and negative.   Physical Exam: VS:  BP 124/66 mmHg  Pulse 72  Ht 5\' 7"  (1.702 m)  Wt 136 lb (61.689 kg)  BMI 21.30 kg/m2  SpO2 93%, BMI Body mass index is 21.3 kg/(m^2).  Wt Readings from Last 3 Encounters:  11/19/14 136 lb (61.689 kg)  08/19/14 143 lb  9.6 oz (65.137 kg)  04/19/14 149 lb 12.8 oz (67.949 kg)     Chronically ill-appearing male in no acute distress, ambulates with walker.  HEENT: Conjunctiva and lids are normal, oropharynx clear with poor dentition.  Neck: Supple, no elevated JVP.  Lungs: Diminished breath sounds with rhonchi. Cardiac: Regular rate and rhythm, indistinct PMI, no S3 gallop.  Abdomen: Nontender, bowel sounds present, no tenderness.  Extremities: 1-2+ bilateral lower leg edema and significant venous stasis.  Skin: Warm and dry.  Muscular skeletal: No kyphosis.  Neuropsychiatric: Alert and oriented x3, affect appropriate.   ECG: ECG is ordered today.   Recent Labwork: 12/16/2013: BUN 44*;  Creat 1.81*; Potassium 3.6; Sodium 138  08/10/2014: BUN 24, creatinine 1.2, potassium 4.4  Other Studies Reviewed Today:  Echocardiogram from June 2013 showed mild LVH with LVEF 55-60%, grade 1 diastolic dysfunction, mild aortic regurgitation, mild mitral regurgitation, mild tricuspid regurgitation, PASP 39 mm mercury.  Assessment and Plan:  1. History of nonischemic cardiomyopathy with improvement in LVEF, now chronic diastolic heart failure. Weight is down, volume status looks better since last encounter. I would like to request his recent lab work from Dr. Kristian Covey to make sure that creatinine has remained stable.  2. Essential hypertension, blood pressure control is good today.  Current medicines were reviewed with the patient today.   Orders Placed This Encounter  Procedures  . EKG 12-Lead    Disposition: FU with me in 3 months.   Signed, Jonelle Sidle, MD, [facility] 11/19/2014 11:23 AM    [facility] at [facility] [address] [place], [place], [place] [address] Phone: [phone]; Fax: [phone]

## 2014-11-30 ENCOUNTER — Other Ambulatory Visit: Payer: Self-pay | Admitting: *Deleted

## 2014-11-30 MED ORDER — HYDRALAZINE HCL 50 MG PO TABS: 50.0000 mg | ORAL_TABLET | Freq: Three times a day (TID) | ORAL | Status: DC

## 2014-12-13 DIAGNOSIS — N184 Chronic kidney disease, stage 4 (severe): Secondary | ICD-10-CM | POA: Diagnosis not present

## 2014-12-13 DIAGNOSIS — Z23 Encounter for immunization: Secondary | ICD-10-CM | POA: Diagnosis not present

## 2014-12-13 DIAGNOSIS — I429 Cardiomyopathy, unspecified: Secondary | ICD-10-CM | POA: Diagnosis not present

## 2014-12-13 DIAGNOSIS — J449 Chronic obstructive pulmonary disease, unspecified: Secondary | ICD-10-CM | POA: Diagnosis not present

## 2014-12-13 DIAGNOSIS — I259 Chronic ischemic heart disease, unspecified: Secondary | ICD-10-CM | POA: Diagnosis not present

## 2014-12-27 ENCOUNTER — Other Ambulatory Visit: Payer: Self-pay

## 2014-12-27 MED ORDER — CARVEDILOL 6.25 MG PO TABS: 6.2500 mg | ORAL_TABLET | Freq: Two times a day (BID) | ORAL | Status: DC

## 2014-12-27 NOTE — Telephone Encounter (Signed)
Refill complete 

## 2015-01-18 DIAGNOSIS — D519 Vitamin B12 deficiency anemia, unspecified: Secondary | ICD-10-CM | POA: Diagnosis not present

## 2015-02-01 DIAGNOSIS — D649 Anemia, unspecified: Secondary | ICD-10-CM | POA: Diagnosis not present

## 2015-02-01 DIAGNOSIS — Z79899 Other long term (current) drug therapy: Secondary | ICD-10-CM | POA: Diagnosis not present

## 2015-02-01 DIAGNOSIS — R809 Proteinuria, unspecified: Secondary | ICD-10-CM | POA: Diagnosis not present

## 2015-02-01 DIAGNOSIS — N183 Chronic kidney disease, stage 3 (moderate): Secondary | ICD-10-CM | POA: Diagnosis not present

## 2015-02-01 DIAGNOSIS — I10 Essential (primary) hypertension: Secondary | ICD-10-CM | POA: Diagnosis not present

## 2015-02-09 DIAGNOSIS — N183 Chronic kidney disease, stage 3 (moderate): Secondary | ICD-10-CM | POA: Diagnosis not present

## 2015-02-09 DIAGNOSIS — I509 Heart failure, unspecified: Secondary | ICD-10-CM | POA: Diagnosis not present

## 2015-02-09 DIAGNOSIS — E559 Vitamin D deficiency, unspecified: Secondary | ICD-10-CM | POA: Diagnosis not present

## 2015-02-09 DIAGNOSIS — D649 Anemia, unspecified: Secondary | ICD-10-CM | POA: Diagnosis not present

## 2015-02-21 ENCOUNTER — Ambulatory Visit (INDEPENDENT_AMBULATORY_CARE_PROVIDER_SITE_OTHER): Payer: Medicare Other | Admitting: Cardiology

## 2015-02-21 ENCOUNTER — Encounter (HOSPITAL_COMMUNITY)
Admission: RE | Admit: 2015-02-21 | Discharge: 2015-02-21 | Disposition: A | Discharge status: Home / Self Care | Payer: Medicare Other | Source: Ambulatory Visit | Attending: Nephrology | Admitting: Nephrology

## 2015-02-21 ENCOUNTER — Encounter: Payer: Self-pay | Admitting: Cardiology

## 2015-02-21 VITALS — BP 116/60 | HR 99 | Ht 67.0 in | Wt 140.0 lb

## 2015-02-21 DIAGNOSIS — I10 Essential (primary) hypertension: Secondary | ICD-10-CM | POA: Diagnosis not present

## 2015-02-21 DIAGNOSIS — D631 Anemia in chronic kidney disease: Secondary | ICD-10-CM | POA: Insufficient documentation

## 2015-02-21 DIAGNOSIS — N183 Chronic kidney disease, stage 3 unspecified: Secondary | ICD-10-CM

## 2015-02-21 DIAGNOSIS — Z79899 Other long term (current) drug therapy: Secondary | ICD-10-CM

## 2015-02-21 DIAGNOSIS — I5032 Chronic diastolic (congestive) heart failure: Secondary | ICD-10-CM | POA: Diagnosis not present

## 2015-02-21 LAB — HEMOGLOBIN AND HEMATOCRIT, BLOOD
HEMATOCRIT: 27.8 % — AB (ref 39.0–52.0)
Hemoglobin: 8.8 g/dL — ABNORMAL LOW (ref 13.0–17.0)

## 2015-02-21 MED ORDER — EPOETIN ALFA 2000 UNIT/ML IJ SOLN
2000.0000 [IU] | INTRAMUSCULAR | Status: DC
  Administered 2015-02-21: 2000 [IU] via SUBCUTANEOUS
  Filled 2015-02-21: qty 1

## 2015-02-21 MED ORDER — EPOETIN ALFA 4000 UNIT/ML IJ SOLN
INTRAMUSCULAR | Status: AC
  Filled 2015-02-21: qty 1

## 2015-02-21 NOTE — Progress Notes (Signed)
Cardiology Office Note  Date: 02/21/2015   ID: [name], DOB [birth year], MRN [id number]  PCP: Dwana Melena, MD  Primary Cardiologist: Nona Dell, MD   Chief Complaint  Patient presents with  . Cardiomyopathy    History of Present Illness: [name] Rice is a 79 y.o. male last seen in September. He is here today for a follow-up visit. Compared to last encounter his weight is up only 4 pounds. He reports good appetite, reasonable control of his chronic leg edema. We discussed his medications which are outlined below. Lasix continues at 40 mg daily.  He has ongoing follow-up with nephrology for chronic kidney disease, creatinine was 1.6 back in August. He is now on iron supplements with anemia of chronic disease.  He does not report any angina symptoms or palpitations.   Past Medical History  Diagnosis Date  . NICM (nonischemic cardiomyopathy) (HCC)     LVEF 55% 3/11  . Coronary atherosclerosis of native coronary artery     Nonobstructive  . Essential hypertension, benign   . COPD (chronic obstructive pulmonary disease) (HCC)   . Pulmonary hypertension (HCC)   . Mitral regurgitation     Moderate  . Ventricular dysfunction, right   . Tricuspid regurgitation   . Lower extremity edema   . GERD (gastroesophageal reflux disease)   . DJD (degenerative joint disease)   . Bradycardia   . Paroxysmal atrial fibrillation (HCC)   . CKD (chronic kidney disease), stage III     Has had temporary dialysis in the past    Current Outpatient Prescriptions  Medication Sig Dispense Refill  . aspirin 81 MG tablet Take 81 mg by mouth daily.      . carvedilol (COREG) 6.25 MG tablet Take 1 tablet (6.25 mg total) by mouth 2 (two) times daily with a meal. 60 tablet 3  . Cholecalciferol (VITAMIN D3) 1000 UNITS CAPS Take 1 capsule by mouth daily.    . cyanocobalamin (,VITAMIN B-12,) 1000 MCG/ML injection   0  . FERREX 150 150 MG capsule Take 150 mg by mouth daily.  0  . furosemide  (LASIX) 40 MG tablet Take 40 mg by mouth daily.    . hydrALAZINE (APRESOLINE) 50 MG tablet Take 1 tablet (50 mg total) by mouth 3 (three) times daily. 90 tablet 11  . Multiple Vitamins-Minerals (VISION-VITE PRESERVE PO) Take by mouth.    . simvastatin (ZOCOR) 5 MG tablet Take 1 tablet (5 mg total) by mouth at bedtime. 90 tablet 1  . tiotropium (SPIRIVA) 18 MCG inhalation capsule Place 18 mcg into inhaler and inhale daily.      No current facility-administered medications for this visit.    Allergies:  Review of patient's allergies indicates no known allergies.   Social History: The patient  reports that he quit smoking about 13 years ago. His smoking use included Cigarettes. He started smoking about 53 years ago. He has a 40 pack-year smoking history. He has never used smokeless tobacco. He reports that he drinks alcohol. He reports that he does not use illicit drugs.   ROS:  Please see the history of present illness. Otherwise, complete review of systems is positive for decreased hearing, chronic stable dyspnea on exertion and leg edema.  All other systems are reviewed and negative.   Physical Exam: VS:  BP 116/60 mmHg  Pulse 99  Ht 5\' 7"  (1.702 m)  Wt 140 lb (63.504 kg)  BMI 21.92 kg/m2  SpO2 98%, BMI Body mass index is  21.92 kg/(m^2).  Wt Readings from Last 3 Encounters:  02/21/15 140 lb (63.504 kg)  11/19/14 136 lb (61.689 kg)  08/19/14 143 lb 9.6 oz (65.137 kg)    Chronically ill-appearing male in no acute distress, ambulates with walker.  HEENT: Conjunctiva and lids are normal, oropharynx clear with poor dentition.  Neck: Supple, no elevated JVP.  Lungs: Diminished breath sounds with rhonchi. Cardiac: Regular rate and rhythm, indistinct PMI, no S3 gallop.  Abdomen: Nontender, bowel sounds present, no tenderness.  Extremities: 1-2+ bilateral lower leg edema and significant venous stasis.    ECG: ECG is not ordered today.  Recent Labwork:  August 2016: BUN 29,  creatinine 1.6, potassium 3.9  Other Studies Reviewed Today:  Echocardiogram 09/13/2011: Study Conclusions  - Left ventricle: The cavity size was normal. Wall thickness was increased in a pattern of mild LVH. Systolic function was normal. The estimated ejection fraction was in the range of 55% to 60%. Wall motion was normal; there were no regional wall motion abnormalities. Doppler parameters are consistent with abnormal left ventricular relaxation (grade 1 diastolic dysfunction). - Aortic valve: Trileaflet; mildly calcified leaflets. Mild regurgitation. Mean gradient: 9mm Hg (S). - Mitral valve: Mild regurgitation. - Atrial septum: No defect or patent foramen ovale was identified. - Tricuspid valve: Mild regurgitation. Peak gradient: 39mm Hg (D). - Pulmonary arteries: Systolic pressure was mildly to moderately increased. - Pericardium, extracardiac: There was no pericardial effusion.  ASSESSMENT AND PLAN:  1. History of nonischemic cardiomyopathy with normalization of LVEF and chronic diastolic heart failure. He is relatively stable at this time. We will continue current medications. BMET for next visit.  2. Essential hypertension, blood pressure control is good today.  3. Chronic kidney disease, stage 3.  Current medicines were reviewed at length with the patient today.   Orders Placed This Encounter  Procedures  . Basic Metabolic Panel (BMET)    Disposition: FU with me in 6 months.   Signed, Jonelle Sidle, MD, [facility] 02/21/2015 11:38 AM    [facility] [address] [place], [place] [address] Phone: [phone]; Fax: [phone]

## 2015-02-21 NOTE — Patient Instructions (Signed)
Your physician wants you to follow-up in: 6 months with Dr.Mcdowell You will receive a reminder letter in the mail two months in advance. If you don't receive a letter, please call our office to schedule the follow-up appointment.    Your physician recommends that you continue on your current medications as directed. Please refer to the Current Medication list given to you today.    If you need a refill on your cardiac medications before your next appointment, please call your pharmacy.   Your physician recommends that you return for lab work in: [employer] lab work [employer] BEFORE NEXT VISIT IN June 2017

## 2015-02-23 ENCOUNTER — Telehealth: Payer: Self-pay

## 2015-02-23 MED ORDER — SIMVASTATIN 5 MG PO TABS: 5.0000 mg | ORAL_TABLET | Freq: Every day | ORAL | Status: DC

## 2015-02-23 NOTE — Telephone Encounter (Signed)
Sent in rx.

## 2015-03-01 DIAGNOSIS — D519 Vitamin B12 deficiency anemia, unspecified: Secondary | ICD-10-CM | POA: Diagnosis not present

## 2015-03-07 ENCOUNTER — Encounter (HOSPITAL_COMMUNITY)
Admission: RE | Admit: 2015-03-07 | Discharge: 2015-03-07 | Disposition: A | Discharge status: Home / Self Care | Payer: Medicare Other | Source: Ambulatory Visit | Attending: Nephrology | Admitting: Nephrology

## 2015-03-07 DIAGNOSIS — N183 Chronic kidney disease, stage 3 (moderate): Secondary | ICD-10-CM | POA: Diagnosis not present

## 2015-03-07 DIAGNOSIS — D631 Anemia in chronic kidney disease: Secondary | ICD-10-CM | POA: Diagnosis not present

## 2015-03-07 LAB — HEMOGLOBIN AND HEMATOCRIT, BLOOD
HCT: 28.1 % — ABNORMAL LOW (ref 39.0–52.0)
Hemoglobin: 8.8 g/dL — ABNORMAL LOW (ref 13.0–17.0)

## 2015-03-07 MED ORDER — EPOETIN ALFA 2000 UNIT/ML IJ SOLN
2000.0000 [IU] | INTRAMUSCULAR | Status: DC
  Administered 2015-03-07: 2000 [IU] via SUBCUTANEOUS
  Filled 2015-03-07: qty 1

## 2015-03-07 MED ORDER — EPOETIN ALFA 4000 UNIT/ML IJ SOLN
INTRAMUSCULAR | Status: AC
  Filled 2015-03-07: qty 1

## 2015-03-07 NOTE — Progress Notes (Signed)
Results for DAISHAWN, SALAMA (MRN [id number]) as of 03/07/2015 10:51  Ref. Range 03/07/2015 10:10  Hemoglobin Latest Ref Range: 13.0-17.0 g/dL 8.8 (L)  HCT Latest Ref Range: 39.0-52.0 % 28.1 (L)

## 2015-03-22 ENCOUNTER — Encounter (HOSPITAL_COMMUNITY)
Admission: RE | Admit: 2015-03-22 | Discharge: 2015-03-22 | Disposition: A | Discharge status: Home / Self Care | Payer: Medicare Other | Source: Ambulatory Visit | Attending: Nephrology | Admitting: Nephrology

## 2015-03-22 ENCOUNTER — Encounter (HOSPITAL_COMMUNITY): Payer: Self-pay

## 2015-03-22 DIAGNOSIS — N189 Chronic kidney disease, unspecified: Secondary | ICD-10-CM | POA: Diagnosis not present

## 2015-03-22 DIAGNOSIS — K861 Other chronic pancreatitis: Secondary | ICD-10-CM | POA: Diagnosis not present

## 2015-03-22 LAB — HEMOGLOBIN AND HEMATOCRIT, BLOOD
HCT: 28 % — ABNORMAL LOW (ref 39.0–52.0)
HEMOGLOBIN: 8.9 g/dL — AB (ref 13.0–17.0)

## 2015-03-22 MED ORDER — EPOETIN ALFA 2000 UNIT/ML IJ SOLN: 2000.0000 [IU] | Freq: Once | INTRAMUSCULAR | Status: DC

## 2015-03-22 MED ORDER — EPOETIN ALFA 4000 UNIT/ML IJ SOLN
2000.0000 [IU] | Freq: Once | INTRAMUSCULAR | Status: AC
  Administered 2015-03-22: 2000 [IU] via SUBCUTANEOUS

## 2015-03-22 MED ORDER — EPOETIN ALFA 4000 UNIT/ML IJ SOLN
INTRAMUSCULAR | Status: AC
  Filled 2015-03-22: qty 1

## 2015-03-22 NOTE — Progress Notes (Signed)
Results for [name], [name] (MRN [id number]) as of 03/22/2015 11:34  Ref. Range 03/22/2015 09:58  Hemoglobin Latest Ref Range: 13.0-17.0 g/dL 8.9 (L)  HCT Latest Ref Range: 39.0-52.0 % 28.0 (L)

## 2015-03-25 ENCOUNTER — Other Ambulatory Visit: Payer: Self-pay | Admitting: Cardiology

## 2015-04-01 DIAGNOSIS — D519 Vitamin B12 deficiency anemia, unspecified: Secondary | ICD-10-CM | POA: Diagnosis not present

## 2015-04-05 ENCOUNTER — Encounter (HOSPITAL_COMMUNITY)
Admission: RE | Admit: 2015-04-05 | Discharge: 2015-04-05 | Disposition: A | Discharge status: Home / Self Care | Payer: Medicare Other | Source: Ambulatory Visit | Attending: Nephrology | Admitting: Nephrology

## 2015-04-05 DIAGNOSIS — N189 Chronic kidney disease, unspecified: Secondary | ICD-10-CM | POA: Diagnosis not present

## 2015-04-05 DIAGNOSIS — K861 Other chronic pancreatitis: Secondary | ICD-10-CM | POA: Diagnosis not present

## 2015-04-05 LAB — HEMOGLOBIN AND HEMATOCRIT, BLOOD
HEMATOCRIT: 27 % — AB (ref 39.0–52.0)
Hemoglobin: 8.6 g/dL — ABNORMAL LOW (ref 13.0–17.0)

## 2015-04-05 MED ORDER — EPOETIN ALFA 3000 UNIT/ML IJ SOLN
2000.0000 [IU] | INTRAMUSCULAR | Status: DC
  Administered 2015-04-05: 2000 [IU] via SUBCUTANEOUS

## 2015-04-05 MED ORDER — EPOETIN ALFA 4000 UNIT/ML IJ SOLN
INTRAMUSCULAR | Status: AC
  Filled 2015-04-05: qty 1

## 2015-04-19 ENCOUNTER — Encounter (HOSPITAL_COMMUNITY)
Admission: RE | Admit: 2015-04-19 | Discharge: 2015-04-19 | Disposition: A | Discharge status: Home / Self Care | Payer: Medicare Other | Source: Ambulatory Visit | Attending: Nephrology | Admitting: Nephrology

## 2015-04-19 ENCOUNTER — Encounter (HOSPITAL_COMMUNITY): Payer: Self-pay

## 2015-04-19 DIAGNOSIS — K861 Other chronic pancreatitis: Secondary | ICD-10-CM | POA: Diagnosis not present

## 2015-04-19 DIAGNOSIS — N189 Chronic kidney disease, unspecified: Secondary | ICD-10-CM | POA: Diagnosis not present

## 2015-04-19 LAB — HEMOGLOBIN AND HEMATOCRIT, BLOOD
HEMATOCRIT: 27.1 % — AB (ref 39.0–52.0)
HEMOGLOBIN: 8.6 g/dL — AB (ref 13.0–17.0)

## 2015-04-19 MED ORDER — EPOETIN ALFA 4000 UNIT/ML IJ SOLN
2000.0000 [IU] | Freq: Once | INTRAMUSCULAR | Status: AC
  Administered 2015-04-19: 2000 [IU] via SUBCUTANEOUS

## 2015-04-19 MED ORDER — EPOETIN ALFA 4000 UNIT/ML IJ SOLN
INTRAMUSCULAR | Status: AC
  Filled 2015-04-19: qty 1

## 2015-04-19 NOTE — Progress Notes (Signed)
Results for ALANMICHAEL, SPINDEL (MRN [id number]) as of 04/19/2015 15:23  Labs drawn today 04/19/2015,  Received Procrit 2000 units SQ as indicated   Ref. Range 04/19/2015 10:00  Hemoglobin Latest Ref Range: 13.0-17.0 g/dL 8.6 (L)  HCT Latest Ref Range: 39.0-52.0 % 27.1 (L)

## 2015-04-26 DIAGNOSIS — D519 Vitamin B12 deficiency anemia, unspecified: Secondary | ICD-10-CM | POA: Diagnosis not present

## 2015-05-02 MED ORDER — IBANDRONATE SODIUM 3 MG/3ML IV SOLN: 3.0000 mg | Freq: Once | INTRAVENOUS | Status: DC

## 2015-05-03 ENCOUNTER — Encounter (HOSPITAL_COMMUNITY)
Admission: RE | Admit: 2015-05-03 | Discharge: 2015-05-03 | Disposition: A | Discharge status: Home / Self Care | Payer: Medicare Other | Source: Ambulatory Visit | Attending: Nephrology | Admitting: Nephrology

## 2015-05-03 ENCOUNTER — Encounter (HOSPITAL_COMMUNITY): Payer: Self-pay

## 2015-05-03 DIAGNOSIS — D631 Anemia in chronic kidney disease: Secondary | ICD-10-CM | POA: Insufficient documentation

## 2015-05-03 DIAGNOSIS — N184 Chronic kidney disease, stage 4 (severe): Secondary | ICD-10-CM | POA: Insufficient documentation

## 2015-05-03 HISTORY — DX: Unspecified hearing loss, unspecified ear: H91.90

## 2015-05-03 LAB — HEMOGLOBIN AND HEMATOCRIT, BLOOD
HEMATOCRIT: 27.4 % — AB (ref 39.0–52.0)
HEMOGLOBIN: 8.7 g/dL — AB (ref 13.0–17.0)

## 2015-05-03 MED ORDER — EPOETIN ALFA 4000 UNIT/ML IJ SOLN
2000.0000 [IU] | Freq: Once | INTRAMUSCULAR | Status: AC
  Administered 2015-05-03: 2000 [IU] via SUBCUTANEOUS

## 2015-05-03 MED ORDER — EPOETIN ALFA 2000 UNIT/ML IJ SOLN: 2000.0000 [IU] | Freq: Once | INTRAMUSCULAR | Status: DC

## 2015-05-03 MED ORDER — EPOETIN ALFA 4000 UNIT/ML IJ SOLN
INTRAMUSCULAR | Status: AC
  Filled 2015-05-03: qty 1

## 2015-05-03 MED ORDER — EPOETIN ALFA 4000 UNIT/ML IJ SOLN: 2000.0000 [IU] | Freq: Once | INTRAMUSCULAR | Status: DC

## 2015-05-03 NOTE — Progress Notes (Signed)
Results for DEVAN, ALLMARAS (MRN [id number]) as of 05/03/2015 10:19  Ref. Range 05/03/2015 09:57  Hemoglobin Latest Ref Range: 13.0-17.0 g/dL 8.7 (L)  HCT Latest Ref Range: 39.0-52.0 % 27.4 (L)   Procrit 2000 units SQ given per MD order.

## 2015-05-17 ENCOUNTER — Encounter (HOSPITAL_COMMUNITY)
Admission: RE | Admit: 2015-05-17 | Discharge: 2015-05-17 | Disposition: A | Discharge status: Home / Self Care | Payer: Medicare Other | Source: Ambulatory Visit | Attending: Nephrology | Admitting: Nephrology

## 2015-05-17 DIAGNOSIS — D631 Anemia in chronic kidney disease: Secondary | ICD-10-CM | POA: Diagnosis not present

## 2015-05-17 DIAGNOSIS — N184 Chronic kidney disease, stage 4 (severe): Secondary | ICD-10-CM | POA: Diagnosis not present

## 2015-05-17 LAB — HEMOGLOBIN AND HEMATOCRIT, BLOOD
HCT: 27.8 % — ABNORMAL LOW (ref 39.0–52.0)
HEMOGLOBIN: 8.7 g/dL — AB (ref 13.0–17.0)

## 2015-05-17 MED ORDER — EPOETIN ALFA 4000 UNIT/ML IJ SOLN
INTRAMUSCULAR | Status: AC
  Filled 2015-05-17: qty 1

## 2015-05-17 MED ORDER — EPOETIN ALFA 4000 UNIT/ML IJ SOLN
2000.0000 [IU] | INTRAMUSCULAR | Status: DC
  Administered 2015-05-17: 2000 [IU] via SUBCUTANEOUS

## 2015-05-17 NOTE — Progress Notes (Signed)
Results for [name] Rice, [name] Rice (MRN [id number]) as of 05/17/2015 12:29  Ref. Range 05/17/2015 09:50  Hemoglobin Latest Ref Range: 13.0-17.0 g/dL 8.7 (L)  HCT Latest Ref Range: 39.0-52.0 % 27.8 (L)   Procrit 2000 units SQ given per MD order.

## 2015-05-24 DIAGNOSIS — D519 Vitamin B12 deficiency anemia, unspecified: Secondary | ICD-10-CM | POA: Diagnosis not present

## 2015-05-31 ENCOUNTER — Encounter (HOSPITAL_COMMUNITY)
Admission: RE | Admit: 2015-05-31 | Discharge: 2015-05-31 | Disposition: A | Discharge status: Home / Self Care | Payer: Medicare Other | Source: Ambulatory Visit | Attending: Nephrology | Admitting: Nephrology

## 2015-05-31 DIAGNOSIS — N184 Chronic kidney disease, stage 4 (severe): Secondary | ICD-10-CM | POA: Diagnosis not present

## 2015-05-31 DIAGNOSIS — D631 Anemia in chronic kidney disease: Secondary | ICD-10-CM | POA: Diagnosis not present

## 2015-05-31 LAB — HEMOGLOBIN AND HEMATOCRIT, BLOOD
HCT: 28.4 % — ABNORMAL LOW (ref 39.0–52.0)
Hemoglobin: 9.1 g/dL — ABNORMAL LOW (ref 13.0–17.0)

## 2015-05-31 MED ORDER — EPOETIN ALFA 3000 UNIT/ML IJ SOLN
2000.0000 [IU] | Freq: Once | INTRAMUSCULAR | Status: DC
  Filled 2015-05-31: qty 1

## 2015-05-31 MED ORDER — EPOETIN ALFA 20000 UNIT/ML IJ SOLN
INTRAMUSCULAR | Status: AC
  Filled 2015-05-31: qty 1

## 2015-05-31 MED ORDER — EPOETIN ALFA 4000 UNIT/ML IJ SOLN
2000.0000 [IU] | INTRAMUSCULAR | Status: DC
  Administered 2015-05-31: 2000 [IU] via SUBCUTANEOUS

## 2015-05-31 NOTE — Progress Notes (Signed)
Results for RHODERICK, KORMOS (MRN [id number]) as of 05/31/2015 14:03  Ref. Range 05/31/2015 09:26  Hemoglobin Latest Ref Range: 13.0-17.0 g/dL 9.1 (L)  HCT Latest Ref Range: 39.0-52.0 % 28.4 (L)   Patient received Procrit 20,000 units

## 2015-06-13 DIAGNOSIS — D518 Other vitamin B12 deficiency anemias: Secondary | ICD-10-CM | POA: Diagnosis not present

## 2015-06-13 DIAGNOSIS — R7301 Impaired fasting glucose: Secondary | ICD-10-CM | POA: Diagnosis not present

## 2015-06-13 DIAGNOSIS — I10 Essential (primary) hypertension: Secondary | ICD-10-CM | POA: Diagnosis not present

## 2015-06-13 DIAGNOSIS — E782 Mixed hyperlipidemia: Secondary | ICD-10-CM | POA: Diagnosis not present

## 2015-06-13 DIAGNOSIS — N184 Chronic kidney disease, stage 4 (severe): Secondary | ICD-10-CM | POA: Diagnosis not present

## 2015-06-14 ENCOUNTER — Encounter (HOSPITAL_COMMUNITY)
Admission: RE | Admit: 2015-06-14 | Discharge: 2015-06-14 | Disposition: A | Discharge status: Home / Self Care | Payer: Medicare Other | Source: Ambulatory Visit | Attending: Nephrology | Admitting: Nephrology

## 2015-06-14 ENCOUNTER — Encounter (HOSPITAL_COMMUNITY): Payer: Self-pay

## 2015-06-14 DIAGNOSIS — N184 Chronic kidney disease, stage 4 (severe): Secondary | ICD-10-CM | POA: Diagnosis not present

## 2015-06-14 DIAGNOSIS — R600 Localized edema: Secondary | ICD-10-CM | POA: Diagnosis not present

## 2015-06-14 DIAGNOSIS — L03115 Cellulitis of right lower limb: Secondary | ICD-10-CM | POA: Diagnosis not present

## 2015-06-14 DIAGNOSIS — L03116 Cellulitis of left lower limb: Secondary | ICD-10-CM | POA: Diagnosis not present

## 2015-06-14 DIAGNOSIS — D631 Anemia in chronic kidney disease: Secondary | ICD-10-CM | POA: Diagnosis not present

## 2015-06-14 LAB — HEMOGLOBIN AND HEMATOCRIT, BLOOD
HCT: 27 % — ABNORMAL LOW (ref 39.0–52.0)
Hemoglobin: 8.5 g/dL — ABNORMAL LOW (ref 13.0–17.0)

## 2015-06-14 MED ORDER — EPOETIN ALFA 2000 UNIT/ML IJ SOLN
2000.0000 [IU] | Freq: Once | INTRAMUSCULAR | Status: AC
  Administered 2015-06-14: 2000 [IU] via SUBCUTANEOUS
  Filled 2015-06-14: qty 1

## 2015-06-14 NOTE — Progress Notes (Signed)
Results for OSA, BARTNIK (MRN [id number]) as of 06/14/2015 10:49  Ref. Range 06/14/2015 09:30  Hemoglobin Latest Ref Range: 13.0-17.0 g/dL 8.5 (L)  HCT Latest Ref Range: 39.0-52.0 % 27.0 (L)

## 2015-06-17 DIAGNOSIS — N184 Chronic kidney disease, stage 4 (severe): Secondary | ICD-10-CM | POA: Diagnosis not present

## 2015-06-17 DIAGNOSIS — L03115 Cellulitis of right lower limb: Secondary | ICD-10-CM | POA: Diagnosis not present

## 2015-06-17 DIAGNOSIS — R600 Localized edema: Secondary | ICD-10-CM | POA: Diagnosis not present

## 2015-06-17 DIAGNOSIS — L03116 Cellulitis of left lower limb: Secondary | ICD-10-CM | POA: Diagnosis not present

## 2015-06-20 ENCOUNTER — Ambulatory Visit (HOSPITAL_COMMUNITY): Payer: Medicare Other | Attending: Internal Medicine | Admitting: Physical Therapy

## 2015-06-20 ENCOUNTER — Other Ambulatory Visit: Payer: Self-pay | Admitting: *Deleted

## 2015-06-20 DIAGNOSIS — I890 Lymphedema, not elsewhere classified: Secondary | ICD-10-CM | POA: Diagnosis not present

## 2015-06-20 DIAGNOSIS — X58XXXA Exposure to other specified factors, initial encounter: Secondary | ICD-10-CM | POA: Diagnosis not present

## 2015-06-20 DIAGNOSIS — T148 Other injury of unspecified body region: Secondary | ICD-10-CM | POA: Diagnosis not present

## 2015-06-20 DIAGNOSIS — R29898 Other symptoms and signs involving the musculoskeletal system: Secondary | ICD-10-CM | POA: Diagnosis not present

## 2015-06-20 DIAGNOSIS — R262 Difficulty in walking, not elsewhere classified: Secondary | ICD-10-CM | POA: Diagnosis not present

## 2015-06-20 DIAGNOSIS — T148XXA Other injury of unspecified body region, initial encounter: Secondary | ICD-10-CM

## 2015-06-20 NOTE — Patient Outreach (Signed)
[facility]) Care Management  06/20/2015  MARKLEY MUJICA 10/21/1935 [id number]  MD referral : Telephone call to patient; call answered by patient's cousin([name] Dockery) who states that patient does not live with her but she takes his calls because he does not have a phone. Advised that I would need to get  permission from patient in order to talk to her about his health concerns. States she will not be at his home till Wednesday so advised me to call that day in order to get permission from him.   Plan: Will follow up.  Colleen Can, RN BSN CCM Care Management Coordinator [facility] Care Management  [phone]

## 2015-06-20 NOTE — Therapy (Signed)
[facility] [address] [place], [place], [address] Phone: [phone]   Fax:  [phone]  Wound Care Evaluation  Patient Details  Name: [name] Rice MRN: [id number] Date of Birth: October 13, 1935 Referring Provider: Nita Sells  Encounter Date: 06/20/2015      PT End of Session - 06/20/15 1224    Visit Number 1   Number of Visits 16   Date for PT Re-Evaluation 07/20/15   Authorization Type [employer] medicare   Authorization - Visit Number 1   Authorization - Number of Visits 10   PT Start Time 1015   PT Stop Time 1150   PT Time Calculation (min) 95 min   Activity Tolerance Patient tolerated treatment well   Behavior During Therapy [facility] for tasks assessed/performed      Past Medical History  Diagnosis Date  . NICM (nonischemic cardiomyopathy) (HCC)     LVEF 55% 3/11  . Coronary atherosclerosis of native coronary artery     Nonobstructive  . Essential hypertension, benign   . COPD (chronic obstructive pulmonary disease) (HCC)   . Pulmonary hypertension (HCC)   . Mitral regurgitation     Moderate  . Ventricular dysfunction, right   . Tricuspid regurgitation   . Lower extremity edema   . GERD (gastroesophageal reflux disease)   . DJD (degenerative joint disease)   . Bradycardia   . Paroxysmal atrial fibrillation (HCC)   . CKD (chronic kidney disease), stage III     Has had temporary dialysis in the past  . HOH (hard of hearing)     Past Surgical History  Procedure Laterality Date  . Total hip arthroplasty  ~ 2005    left  . Dialysis fistula creation      left forearm  . Cataract extraction w/ intraocular lens implant      right    There were no vitals filed for this visit.  Visit Diagnosis:  Lymphedema, not elsewhere classified  Leg heaviness  Difficulty in walking, not elsewhere classified  Nonhealing nonsurgical wound        [facility] PT Assessment - 06/20/15 0001    Assessment   Medical Diagnosis Lymphedema with  cellulitis and infected wound    Referring Provider Nita Sells   Onset Date/Surgical Date 05/12/15   Next MD Visit 07/02/2015   Prior Therapy self care    Precautions   Precautions Other (comment)   Balance Screen   Has the patient fallen in the past 6 months No   Has the patient had a decrease in activity level because of a fear of falling?  Yes   Is the patient reluctant to leave their home because of a fear of falling?  Yes   Home Environment   Living Environment Private residence   Living Arrangements Alone   Prior Function   Level of Independence Independent with household mobility with device   Vocation Retired   IT consultant   Overall Cognitive Status Within Functional Limits for tasks assessed   Observation/Other Assessments   Other Surveys  --  lymphedem scale 46/100          06/20/15 1026  Subjective Assessment  Subjective [name] Rice lives alone.  He has developed swelling in both LE which began oozing  approximately two weeks ago.  He sees no progress in his wounds.  His cousin has been coming over everyother day to help him bath and change his socks as he is unable to fit in any of his  shoes.    Patient and Family Stated Goals for pt's swelling  and oozing to go down   Date of Onset 06/12/15  Prior Treatments self care; antibiotic (has been on for a week); cleansing only.   Pain Assessment  Pain Assessment 0-10  Pain Score 9  Pain Type Acute pain  Pain Location Leg  Pain Orientation Right;Left  Pain Descriptors / Indicators Burning  Pain Onset On-going  Patients Stated Pain Goal 0  Pain Intervention(s) Emotional support  Multiple Pain Sites (Both legs. )  Evaluation and Treatment  Evaluation and Treatment Procedures Explained to Patient/Family Yes  Evaluation and Treatment Procedures agreed to  Wound / Incision (Open or Dehisced) 06/20/15 Other (Comment) Leg Right;Posterior constant weeping from distal 1/3 of posterior leg.  Date First Assessed/Time First  Assessed: 06/20/15 1036   Wound Type: Other (Comment)  Location: Leg  Location Orientation: Right;Posterior  Wound Description (Comments): constant weeping from distal 1/3 of posterior leg.  Dressing Type Gauze (Comment) ((Dressing on pt when arrived see dressing for new dressing))  Dressing Changed Changed  Dressing Status Old drainage  Dressing Change Frequency Every 3 days  Site / Wound Assessment Friable;Painful;Pink  Peri-wound Assessment Edema  Drainage Amount Copious  Drainage Description Serous;Odor  Treatment Cleansed;Debridement (Selective)  Wound / Incision (Open or Dehisced) 06/20/15 Toe (Comment  which one) Right entire dorsal aspect   Date First Assessed/Time First Assessed: 06/20/15 1036   Location: Toe (Comment  which one)  Location Orientation: (c) Right  Wound Description (Comments): entire dorsal aspect   Dressing Type None ((As pt came into evaluation see dressing for therapist dress)  Dressing Change Frequency Every 3 days  Site / Wound Assessment Friable;Yellow  % Wound base Red or Granulating 25%  % Wound base Yellow 75%  Wound Length (cm) (wound covers entire dorsal aspect of 2nd toe.)  Drainage Amount Minimal  Drainage Description Serous;Odor  Treatment Cleansed;Debridement (Selective);Other (Comment)  Wound / Incision (Open or Dehisced) 06/20/15 Other (Comment) Toe (Comment  which one) Right 3rd toe   Date First Assessed: 06/20/15   Wound Type: Other (Comment)  Location: Toe (Comment  which one)  Location Orientation: Right  Wound Description (Comments): 3rd toe   Dressing Type (came into department without dressing on toe)  Dressing Change Frequency Every 3 days  % Wound base Red or Granulating 25%  % Wound base Yellow 75%  Wound Length (cm) (entire dorsal aspect of toe )  Drainage Amount Minimal  Treatment Cleansed;Other (Comment)  Selective Debridement  Selective Debridement - Location (Throughout B LE includint Rt 2nd and 3rd toe.)  Selective  Debridement - Tools Used Forceps  Selective Debridement - Tissue Removed dry skin   Wound Therapy - Assess/Plan/Recommendations  Wound Therapy - Clinical Statement [name] Rice states that he has had progressive swelling in both legs but his wounds just began to weep in the past two weeks.  He currently has signs of secondary lymphedma and will benefit from debridement and compression wrapping.  Once his legs have decreased he will benefit from compression wraps.  Due to the fact that the pt lives alone and his deconditioned state he  may benefit from juxtafit rather than compression garments.   Wound Therapy - Functional Problem List difficulty walking, raising legs into the car, clothes fitting,   Factors Delaying/Impairing Wound Healing Altered sensation;Infection - systemic/local;Multiple medical problems;Polypharmacy  Hydrotherapy Plan Debridement;Patient/family education;Other (comment) (compression wrapping )  Wound Therapy - Frequency (2x a week x 8 week or until  weeping and edema has stopped )  Wound Therapy - Current Recommendations PT  Wound Therapy  Dressing  Rt LE:  silverhydrofiber to posterior aspect of Rt LE as well as dorsal aspect of 2nc and 3rd toe; toe wraps, and multilayer compression wrap(profore); Lt; xeroforam followed by multilayer comression wrapping.          LYMPHEDEMA/ONCOLOGY QUESTIONNAIRE - 06/20/15 1042    What other symptoms do you have   Are you Having Heaviness or Tightness Yes   Are you having pitting edema Yes   Body Site Rt LE   Is it Hard or Difficult finding clothes that fit Yes   Do you have infections Yes   Stemmer Sign Yes   Lymphedema Stage   Stage STAGE 2 SPONTANEOUSLY IRREVERSIBLE   Lymphedema Assessments   Lymphedema Assessments Lower extremities   Right Lower Extremity Lymphedema   30 cm Proximal to Floor at Lateral Plantar Foot 40.5 cm   20 cm Proximal to Floor at Lateral Plantar Foot 39.8 1   10  cm Proximal to Floor at Lateral Malleoli 30  cm   Circumference of ankle/heel 37.5 cm.   5 cm Proximal to 1st MTP Joint 30 cm   Across MTP Joint 30.2 cm   Around Proximal Great Toe 13 cm   Other 0    Left Lower Extremity Lymphedema   30 cm Proximal to Floor at Lateral Plantar Foot 36.3 cm   20 cm Proximal to Floor at Lateral Plantar Foot 31.5 cm   10 cm Proximal to Floor at Lateral Malleoli 28.8 cm   Circumference of ankle/heel 35.7 cm.   5 cm Proximal to 1st MTP Joint 28.6 cm   Across MTP Joint 27.8 cm   Around Proximal Great Toe 12 cm                      PT Education - 06/20/15 1213    Education provided Yes   Education Details Explained multiple times to pt that if dressings cause any increased pain he is to take them off   Person(s) Educated Patient;Caregiver(s)   Methods Explanation   Comprehension Verbalized understanding          PT Short Term Goals - 06/20/15 1225    PT SHORT TERM GOAL #1   Title Pt  drainage to derease to minimal to prevent soiling of patient's clothes/ shoes    Time 4   Period Weeks   Status New   PT SHORT TERM GOAL #2   Title Pt edema to decrease by 30% to allow pt to fit into shoes and new slippers    Time 4   Period Weeks   Status New   PT SHORT TERM GOAL #3   Title Pt pain to decrease to no greater than a 4/10 to allow pt to be up on his legs walking/standing for 10 minutes at a time to be able to make a quick meal in comfort.    Time 4   Period Weeks   Status New   PT SHORT TERM GOAL #4   Title Pt legs to feel 50% less heavy to allow pt to easily place legs into the car or on the bed.    Time 4   Period Weeks   Status New           PT Long Term Goals - 06/20/15 1228    PT LONG TERM GOAL #1   Title Pt to understand that lymphedma  is a chronic condition and that he should wear compression at all times    Time 8   Period Weeks   Status New   PT LONG TERM GOAL #2   Title Pt to have recieved juxtafit and be able to don/doff gaments independently    Time 8    Period Weeks   Status New   PT LONG TERM GOAL #3   Title Pt to have not weeping or open area of LE to decrease risk of infection    Time 8   Period Weeks   Status New                G-Codes - Jun 22, 2015 1231    Functional Assessment Tool Used lymphedema impact score   Functional Limitation Other PT primary   Other PT Primary Current Status (J1595) At least 40 percent but less than 60 percent impaired, limited or restricted   Other PT Primary Goal Status (Z9672) At least 1 percent but less than 20 percent impaired, limited or restricted      Problem List Patient Active Problem List   Diagnosis Date Noted  . Edema 04/02/2012  . CKD (chronic kidney disease) stage 3, GFR 30-59 ml/min 02/17/2009  . HYPERLIPIDEMIA 08/20/2008  . Essential hypertension, benign 08/20/2008  . CORONARY ATHEROSCLEROSIS NATIVE CORONARY ARTERY 08/20/2008  . Secondary cardiomyopathy [facility]) 08/20/2008    Virgina Organ, PT CLT [phone] 06/22/2015, 12:32 PM  [facility] [address] [place], [place], [address] Phone: [phone]   Fax:  [phone]  Name: ADRYEN JOHANSSON MRN: [id number] Date of Birth: 10/16/1935

## 2015-06-22 ENCOUNTER — Other Ambulatory Visit: Payer: Self-pay | Admitting: *Deleted

## 2015-06-22 ENCOUNTER — Ambulatory Visit (HOSPITAL_COMMUNITY): Payer: Medicare Other | Admitting: Physical Therapy

## 2015-06-22 DIAGNOSIS — T148 Other injury of unspecified body region: Secondary | ICD-10-CM | POA: Diagnosis not present

## 2015-06-22 DIAGNOSIS — R262 Difficulty in walking, not elsewhere classified: Secondary | ICD-10-CM | POA: Diagnosis not present

## 2015-06-22 DIAGNOSIS — I890 Lymphedema, not elsewhere classified: Secondary | ICD-10-CM | POA: Diagnosis not present

## 2015-06-22 DIAGNOSIS — R29898 Other symptoms and signs involving the musculoskeletal system: Secondary | ICD-10-CM | POA: Diagnosis not present

## 2015-06-22 DIAGNOSIS — T148XXA Other injury of unspecified body region, initial encounter: Secondary | ICD-10-CM

## 2015-06-22 NOTE — Therapy (Signed)
[facility] [address] [place], [place], [address] Phone: [phone]   Fax:  [phone]  Wound Care Therapy  Patient Details  Name: [name] Rice MRN: [id number] Date of Birth: [birth year] Referring Provider: Nita Sells  Encounter Date: 06/22/2015      PT End of Session - 06/22/15 1340    Visit Number 2   Number of Visits 16   Date for PT Re-Evaluation 07/20/15   Authorization Type UHC medicare   Authorization - Visit Number 2   Authorization - Number of Visits 10   PT Start Time 0930   PT Stop Time 1100   PT Time Calculation (min) 90 min   Activity Tolerance Patient tolerated treatment well   Behavior During Therapy [facility] for tasks assessed/performed      Past Medical History  Diagnosis Date  . NICM (nonischemic cardiomyopathy) (HCC)     LVEF 55% 3/11  . Coronary atherosclerosis of native coronary artery     Nonobstructive  . Essential hypertension, benign   . COPD (chronic obstructive pulmonary disease) (HCC)   . Pulmonary hypertension (HCC)   . Mitral regurgitation     Moderate  . Ventricular dysfunction, right   . Tricuspid regurgitation   . Lower extremity edema   . GERD (gastroesophageal reflux disease)   . DJD (degenerative joint disease)   . Bradycardia   . Paroxysmal atrial fibrillation (HCC)   . CKD (chronic kidney disease), stage III     Has had temporary dialysis in the past  . HOH (hard of hearing)     Past Surgical History  Procedure Laterality Date  . Total hip arthroplasty  ~ 2005    left  . Dialysis fistula creation      left forearm  . Cataract extraction w/ intraocular lens implant      right    There were no vitals filed for this visit.  Visit Diagnosis:  Lymphedema, not elsewhere classified  Leg heaviness  Difficulty in walking, not elsewhere classified  Nonhealing nonsurgical wound                 Wound Therapy - 06/22/15 1322    Subjective PT states the bandages  were comfortable but his toes and feet are a  little sore.    Date of Onset 06/12/15   Prior Treatments self care; antibiotic (has been on for a week); cleansing only.    Pain Assessment No/denies pain   Wound Properties Date First Assessed: 06/20/15 Time First Assessed: 1036 Wound Type: Other (Comment) Location: Leg Location Orientation: Right;Posterior Wound Description (Comments): constant weeping from distal 1/3 of posterior leg.   Dressing Type Silver hydrofiber  profore   Dressing Changed Changed   Dressing Status Old drainage   Dressing Change Frequency Every 3 days   Site / Wound Assessment Friable;Painful;Pink   % Wound base Red or Granulating 90%   % Wound base Yellow 10%   Peri-wound Assessment Edema;Induration;Denuded   Margins Attached edges (approximated)   Drainage Amount Moderate   Drainage Description Serous   Treatment Cleansed;Debridement (Selective)   Wound Properties Date First Assessed: 06/20/15 Time First Assessed: 1036 Location: Toe (Comment  which one) Location Orientation: Right , 2nd toe   Wound Description (Comments): entire dorsal aspect    Dressing Type Impregnated gauze (bismuth)  toe wraps   Dressing Changed Changed   Dressing Status Intact   Dressing Change Frequency Every 3 days   Site / Wound Assessment Friable;Yellow   %  Wound base Red or Granulating 80%   % Wound base Yellow 20%   Drainage Amount Scant   Drainage Description Serous;Odor   Treatment Cleansed;Debridement (Selective)   Wound Properties Date First Assessed: 06/20/15 Wound Type: Other (Comment) Location: Leg Location Orientation: Left   Dressing Type Impregnated gauze (bismuth)  profore   Dressing Changed Changed   Dressing Status Intact   Dressing Change Frequency Every 3 days   % Wound base Red or Granulating 100%   % Wound base Yellow 0%   Drainage Amount Scant   Treatment Cleansed;Debridement (Selective)   Selective Debridement - Location B LE's  Throughout B LE includint  Rt 2nd and 3rd toe.   Selective Debridement - Tools Used Forceps   Selective Debridement - Tissue Removed dry skin    Wound Therapy - Clinical Statement Vast improvement noted this session with decreased swelling, drainage and overall improvement in skin coloring and status.  Cleansed LE's well and spent majority of time debriding devitalized tissue.  Both LE's with increased granulation with complete healing of 3rd Rt toe and wounds on Lt LE.  Rt LE with decreased drainage and improvement.  Moisturized well with Vaseline and massaged toes and LE's, however did not stimulate other lymph nodes due to time.  Continued with xeroform on Lt LE and changed to xeroform on Rt second toe.  Used xeroform on medial aspect of Rt LE due to raw new skin.  Profore and toe wraps utilized for compression.  Pt reported generalized comfort.  Encouraged patient to complete heel to toe gait as tends to walk on his toes.    Wound Therapy - Functional Problem List difficulty walking, raising legs into the car, clothes fitting,    Factors Delaying/Impairing Wound Healing Altered sensation;Infection - systemic/local;Multiple medical problems;Polypharmacy   Hydrotherapy Plan Debridement;Patient/family education;Other (comment)  compression wrapping    Wound Therapy - Frequency --  2x a week x 8 week or until weeping and edema has stopped    Wound Therapy - Current Recommendations PT   Wound Plan Continue wound care utilizing profore for compression.  Complete manual lymph drainage as time allows.  Instruct in heel to toe gait training to decrease weight bearing through toes.     Dressing  Rt:  silver hydrofiber, profore   Dressing Lt: xeroform, profore                   PT Short Term Goals - 06/22/15 1338    PT SHORT TERM GOAL #1   Title Pt  drainage to derease to minimal to prevent soiling of patient's clothes/ shoes    Time 4   Period Weeks   Status Achieved   PT SHORT TERM GOAL #2   Title Pt edema to  decrease by 30% to allow pt to fit into shoes and new slippers    Time 4   Period Weeks   Status On-going   PT SHORT TERM GOAL #3   Title Pt pain to decrease to no greater than a 4/10 to allow pt to be up on his legs walking/standing for 10 minutes at a time to be able to make a quick meal in comfort.    Time 4   Period Weeks   Status On-going   PT SHORT TERM GOAL #4   Title Pt legs to feel 50% less heavy to allow pt to easily place legs into the car or on the bed.    Time 4   Period Weeks  Status On-going           PT Long Term Goals - 06/22/15 1338    PT LONG TERM GOAL #1   Title Pt to understand that lymphedma is a chronic condition and that he should wear compression at all times    Time 8   Period Weeks   Status On-going   PT LONG TERM GOAL #2   Title Pt to have recieved juxtafit and be able to don/doff gaments independently    Time 8   Period Weeks   Status On-going   PT LONG TERM GOAL #3   Title Pt to have not weeping or open area of LE to decrease risk of infection    Time 8   Period Weeks   Status On-going                Problem List Patient Active Problem List   Diagnosis Date Noted  . Edema 04/02/2012  . CKD (chronic kidney disease) stage 3, GFR 30-59 ml/min 02/17/2009  . HYPERLIPIDEMIA 08/20/2008  . Essential hypertension, benign 08/20/2008  . CORONARY ATHEROSCLEROSIS NATIVE CORONARY ARTERY 08/20/2008  . Secondary cardiomyopathy (HCC) 08/20/2008    [name], PTA/CLT [phone]  06/22/2015, 1:41 PM  [facility] [address] [place], [place], [address] Phone: [phone]   Fax:  [phone]  Name: [name] Rice MRN: [id number] Date of Birth: 1936/03/04

## 2015-06-22 NOTE — Patient Outreach (Signed)
[facility]) Care Management  06/22/2015  ZAVIAR MOSCO [birth year] [id number]   Telephone call to patient; caregiver/cousin answered call. Advised that she was preparing to take patient to appointment at wound care center. She gave phone to patient who advised that he wanted RN CM to speak with caregiver regarding his healthcare concerns.  Caregiver requesting call back after appointment with patient is over.   Plan: Will follow up.  Telephone call to patient's family Erasmo Score -contact #[phone].  [name] was able to give HIPPA  verification.   [name] states patient currently lives alone in apartment in [place]. States he gets around with walker . States she takes him to all of his doctors appointments, does grocery shopping, fixes his medications, gets prescriptions filled and helps with other needs. 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  States he does get a little confused  at times. States he is currently having problems with lymphedema to both lower legs. States he started wound treatment this week 04/03 at [facility]. States she has taken him twice this week to get legs wrapped.   States other medical conditions include kidney disease, COPD, HTN, heart disease, and arthritis.  States patient fell in home  Jan 2017 but refused to go to go to hospital. States he does have emergency alert in bathroom & kitchen and was able to pull for help and was checked by EMS..    States she is concerned that he forgets sometimes to cut off stove because he still cooks.  He lives alone and has trouble with personal care. States they currently have no long term plans in place for him.  States he gets some type of medication assistance but does not have Medicaid.  She would like to know of options and resources that may be available to help with his care.    Caregiver consents to [facility] care management services.  Plan:  Send to care management assistant  to assign to [facility] for complex case management; falls risk and send to Clinical Social Worker for [employer] & psychosocial needs.    Colleen Can, RN BSN CCM Care Management Coordinator [facility] Care Management  [phone]

## 2015-06-27 ENCOUNTER — Ambulatory Visit (HOSPITAL_COMMUNITY): Payer: Medicare Other | Admitting: Physical Therapy

## 2015-06-27 ENCOUNTER — Other Ambulatory Visit: Payer: Self-pay | Admitting: Licensed Clinical Social Worker

## 2015-06-27 ENCOUNTER — Encounter: Payer: Self-pay | Admitting: Licensed Clinical Social Worker

## 2015-06-27 DIAGNOSIS — R262 Difficulty in walking, not elsewhere classified: Secondary | ICD-10-CM | POA: Diagnosis not present

## 2015-06-27 DIAGNOSIS — T148 Other injury of unspecified body region: Secondary | ICD-10-CM | POA: Diagnosis not present

## 2015-06-27 DIAGNOSIS — I890 Lymphedema, not elsewhere classified: Secondary | ICD-10-CM | POA: Diagnosis not present

## 2015-06-27 DIAGNOSIS — R29898 Other symptoms and signs involving the musculoskeletal system: Secondary | ICD-10-CM | POA: Diagnosis not present

## 2015-06-27 NOTE — Therapy (Signed)
[facility] [address] [place], [place], [address] Phone: [phone]   Fax:  [phone]  Wound Care Therapy  Patient Details  Name: [name] Rice MRN: [id number] Date of Birth: 1935-07-31 Referring Provider: Nita Sells  Encounter Date: 06/27/2015      PT End of Session - 06/27/15 1754    Visit Number 3   Number of Visits 16   Date for PT Re-Evaluation 07/20/15   Authorization Type UHC medicare   Authorization - Visit Number 3   Authorization - Number of Visits 10   PT Start Time 1525   PT Stop Time 1640   PT Time Calculation (min) 75 min   Activity Tolerance Patient tolerated treatment well   Behavior During Therapy [facility] for tasks assessed/performed      Past Medical History  Diagnosis Date  . NICM (nonischemic cardiomyopathy) (HCC)     LVEF 55% 3/11  . Coronary atherosclerosis of native coronary artery     Nonobstructive  . Essential hypertension, benign   . COPD (chronic obstructive pulmonary disease) (HCC)   . Pulmonary hypertension (HCC)   . Mitral regurgitation     Moderate  . Ventricular dysfunction, right   . Tricuspid regurgitation   . Lower extremity edema   . GERD (gastroesophageal reflux disease)   . DJD (degenerative joint disease)   . Bradycardia   . Paroxysmal atrial fibrillation (HCC)   . CKD (chronic kidney disease), stage III     Has had temporary dialysis in the past  . HOH (hard of hearing)     Past Surgical History  Procedure Laterality Date  . Total hip arthroplasty  ~ 2005    left  . Dialysis fistula creation      left forearm  . Cataract extraction w/ intraocular lens implant      right    There were no vitals filed for this visit.                  Wound Therapy - 06/27/15 1700    Subjective Pt states his toes are sore on his Rt foot.   No other complaints.   Pain Assessment 0-10   Pain Score 9    Pain Type Acute pain   Pain Location Leg   Pain Orientation Right    Pain Descriptors / Indicators Burning   Wound Properties Date First Assessed: 06/20/15 Time First Assessed: 1036 Wound Type: Other (Comment) Location: Leg Location Orientation: Right;Posterior Wound Description (Comments): constant weeping from distal 1/3 of posterior leg.   Dressing Type Impregnated gauze (bismuth)   Dressing Changed Changed   Dressing Status Old drainage   Dressing Change Frequency Every 3 days   Site / Wound Assessment Red   % Wound base Red or Granulating 95%   % Wound base Yellow 5%   Peri-wound Assessment Edema;Induration;Hemosiderin   Margins Attached edges (approximated)   Drainage Amount Scant   Drainage Description Serous   Treatment Cleansed;Debridement (Selective)   Wound Properties Date First Assessed: 06/20/15 Time First Assessed: 1036 Location: Toe (Comment  which one) Location Orientation: Right , 2nd toe   Wound Description (Comments): entire dorsal aspect    Dressing Type Impregnated gauze (bismuth)   Dressing Changed Changed   Dressing Status Intact   Site / Wound Assessment Friable;Red   % Wound base Red or Granulating 100%   % Wound base Yellow 0%   Drainage Amount None   Treatment Cleansed;Debridement (Selective)   Wound Properties  Date First Assessed: 06/20/15 Wound Type: Other (Comment) Location: Leg Location Orientation: Left   Dressing Type Impregnated gauze (bismuth)   Dressing Changed Changed   Dressing Status Intact   Dressing Change Frequency Every 3 days   Site / Wound Assessment Friable;Red   % Wound base Red or Granulating 100%   % Wound base Yellow 0%   Peri-wound Assessment Hemosiderin;Induration   Drainage Amount Scant   Treatment Cleansed;Debridement (Selective)   Selective Debridement - Location B LE's   Selective Debridement - Tools Used Forceps   Selective Debridement - Tissue Removed dry skin    Wound Therapy - Clinical Statement Pt with increased swelling above bandages bilaterally, Rt>Lt.  Pt also with increased  discomfort in Rt distal LE/toes with noted swelling there.  Toe bandages were intact bilaterally without any signs of wear or cutting into skin, between or underneath toes.  Noted maceration under Rt third and fourth toes but no breaks in skin.  Used silver hydrofiber underneath to decrease maceration and wrapped in gauze without toe bandages or individual wrapping.  Wounds on bilateral LE's, including dorsal second and third toes nearly healed. According to  caregiver, patient frequently sleeps in his recliner with his feet down.  Pt instructed to keep LE's elevated as much as possible.  Pt verbalized understanding, however caregiver reports he will not do this.    Cleansed LE's well and massaged LE's to decrease edema and stimulate lymphatic flow.   Moisturized well with Vaseline and used xeroform on bilateral LE, 2 areas on Rt LE and one area on Lt LE.   Profore utilized for compression.  Pt reported generalized comfort.     Wound Therapy - Functional Problem List difficulty walking, raising legs into the car, clothes fitting,    Factors Delaying/Impairing Wound Healing Altered sensation;Infection - systemic/local;Multiple medical problems;Polypharmacy   Hydrotherapy Plan Debridement;Dressing change   Wound Plan Continue wound care utilizing profore for compression.  Complete manual lymph drainage as time allows.  Instruct in heel to toe gait training to decrease weight bearing through toes.     Dressing Bilaterally xeroform, profore   Manual Therapy bilateral manual lymph drainage with short neck, inguinal axillary anastomosis anterior only                   PT Short Term Goals - 06/22/15 1338    PT SHORT TERM GOAL #1   Title Pt  drainage to derease to minimal to prevent soiling of patient's clothes/ shoes    Time 4   Period Weeks   Status Achieved   PT SHORT TERM GOAL #2   Title Pt edema to decrease by 30% to allow pt to fit into shoes and new slippers    Time 4   Period Weeks    Status On-going   PT SHORT TERM GOAL #3   Title Pt pain to decrease to no greater than a 4/10 to allow pt to be up on his legs walking/standing for 10 minutes at a time to be able to make a quick meal in comfort.    Time 4   Period Weeks   Status On-going   PT SHORT TERM GOAL #4   Title Pt legs to feel 50% less heavy to allow pt to easily place legs into the car or on the bed.    Time 4   Period Weeks   Status On-going           PT Long Term Goals - 06/22/15  1338    PT LONG TERM GOAL #1   Title Pt to understand that lymphedma is a chronic condition and that he should wear compression at all times    Time 8   Period Weeks   Status On-going   PT LONG TERM GOAL #2   Title Pt to have recieved juxtafit and be able to don/doff gaments independently    Time 8   Period Weeks   Status On-going   PT LONG TERM GOAL #3   Title Pt to have not weeping or open area of LE to decrease risk of infection    Time 8   Period Weeks   Status On-going             Patient will benefit from skilled therapeutic intervention in order to improve the following deficits and impairments:     Visit Diagnosis: Lymphedema, not elsewhere classified  Difficulty in walking, not elsewhere classified     Problem List Patient Active Problem List   Diagnosis Date Noted  . Edema 04/02/2012  . CKD (chronic kidney disease) stage 3, GFR 30-59 ml/min 02/17/2009  . HYPERLIPIDEMIA 08/20/2008  . Essential hypertension, benign 08/20/2008  . CORONARY ATHEROSCLEROSIS NATIVE CORONARY ARTERY 08/20/2008  . Secondary cardiomyopathy (HCC) 08/20/2008    [name], PTA/CLT [phone] 06/27/2015, 6:02 PM  [facility] [address] [place], [place], [address] Phone: [phone]   Fax:  [phone]  Name: LELON IKARD MRN: [id number] Date of Birth: [birth year]

## 2015-06-27 NOTE — Patient Outreach (Signed)
Assessment:  CSW received referral on [employer]. CSW completed chart review on client on 06/27/15. Client does not have a personal phone or residential phone. [name] Rice is cousin and caregiver to client and helps client with daily needs of client. CSW called [name] Rice, cousin and caregiver of client, on 06/27/15. CSW spoke via phone with [name] Rice on 06/27/15. CSW verified identity of [name] Rice. [name] Rice gave CSW verbal permission on 06/27/15 for CSW to talk with her about needs of client. [name] Rice and CSW spoke of client needs. [name] Rice said she buys groceries for client and pays bills of client. [name] Rice transports client to and from client medical appointments.  She said client had a fall in January of 2017 but refused to go to emergency room after fall.  She said client uses a walker to ambulate.  She said he moves slowly when using walker.  She said he goes two times weekly for wound care dressings to his legs. She said he gets fatigued or short of breath occasionally.  She said he lives in a handicap accessible apartment. She said he uses mircrowave and oven to cook.  Client has two sisters but they do not live locally. [name] Rice is main caregiver for client. She said client has appointment with Dr. Margo Aye again on 07/07/15.  She said client cannot afford to pay for in home care givers. CSW had talked with [name] Rice about Aging, Disability and Transient Services support for client. However, [name] Rice said client could not afford to pay for in home sitter service or in home nursing assistant services. She said he does not have any extra money to pay for in home care. She said he does not have Medicaid. She said he has not been receiving support from any community agencies locally. CSW and [name] Rice spoke of [facility] consent form. [name] Rice asked for [facility] consent form for client to be mailed to her address at [facility], [address], [place], [place] [address]. She said she would complete [facility] consent form for client and mail it back to [facility]  office in [place], [place].  CSW and [name] Rice completed [facility] assessments needed for client.  CSW and [name] spoke of client care plan. She said that she thought a good goal for client would be for client to talk further with CSW in next 30 days about community resources for client.  CSW encouraged for client to communicate further with CSW in next 30 days about community resources for client. CSW gave [name] Rice CSW phone number of [phone] and encouraged client/[name] Rice to call CSW to discuss social work needs of client.  Plan: Client to communicate with CSW in next  30 days to discuss community resources of assistance to client. CSW to call client/[name] Rice Dockery in two weeks to assess client needs.  Kelton Pillar.Chinenye Katzenberger MSW, LCSW Licensed Clinical Social Worker [facility] Care Management [phone]         Plan:

## 2015-06-28 ENCOUNTER — Encounter (HOSPITAL_COMMUNITY)
Admission: RE | Admit: 2015-06-28 | Discharge: 2015-06-28 | Disposition: A | Discharge status: Home / Self Care | Payer: Medicare Other | Source: Ambulatory Visit | Attending: Nephrology | Admitting: Nephrology

## 2015-06-28 DIAGNOSIS — D638 Anemia in other chronic diseases classified elsewhere: Secondary | ICD-10-CM | POA: Insufficient documentation

## 2015-06-28 DIAGNOSIS — N184 Chronic kidney disease, stage 4 (severe): Secondary | ICD-10-CM | POA: Diagnosis not present

## 2015-06-28 DIAGNOSIS — D519 Vitamin B12 deficiency anemia, unspecified: Secondary | ICD-10-CM | POA: Diagnosis not present

## 2015-06-28 LAB — HEMOGLOBIN AND HEMATOCRIT, BLOOD
HEMATOCRIT: 27.5 % — AB (ref 39.0–52.0)
Hemoglobin: 8.6 g/dL — ABNORMAL LOW (ref 13.0–17.0)

## 2015-06-28 MED ORDER — EPOETIN ALFA 2000 UNIT/ML IJ SOLN
INTRAMUSCULAR | Status: AC
  Filled 2015-06-28: qty 1

## 2015-06-28 MED ORDER — EPOETIN ALFA 2000 UNIT/ML IJ SOLN
2000.0000 [IU] | INTRAMUSCULAR | Status: DC
  Administered 2015-06-28: 2000 [IU] via SUBCUTANEOUS

## 2015-06-28 NOTE — Progress Notes (Signed)
Results for SHAMAL, UPSHAW (MRN [id number]) as of 06/28/2015 10:36  Ref. Range 06/28/2015 09:20  Hemoglobin Latest Ref Range: 13.0-17.0 g/dL 8.6 (L)  HCT Latest Ref Range: 39.0-52.0 % 27.5 (L)

## 2015-07-01 ENCOUNTER — Ambulatory Visit (HOSPITAL_COMMUNITY): Payer: Medicare Other | Admitting: Physical Therapy

## 2015-07-01 DIAGNOSIS — T148XXA Other injury of unspecified body region, initial encounter: Secondary | ICD-10-CM

## 2015-07-01 DIAGNOSIS — I890 Lymphedema, not elsewhere classified: Secondary | ICD-10-CM | POA: Diagnosis not present

## 2015-07-01 DIAGNOSIS — R262 Difficulty in walking, not elsewhere classified: Secondary | ICD-10-CM | POA: Diagnosis not present

## 2015-07-01 DIAGNOSIS — R29898 Other symptoms and signs involving the musculoskeletal system: Secondary | ICD-10-CM | POA: Diagnosis not present

## 2015-07-01 DIAGNOSIS — T148 Other injury of unspecified body region: Secondary | ICD-10-CM | POA: Diagnosis not present

## 2015-07-01 NOTE — Therapy (Signed)
[facility] [address] [place], [place], [address] Phone: [phone]   Fax:  [phone]  Wound Care Therapy  Patient Details  Name: [name] Rice MRN: [id number] Date of Birth: 25-Mar-1935 Referring Provider: Nita Sells  Encounter Date: 07/01/2015      PT End of Session - 07/01/15 1625    Visit Number 4   Number of Visits 16   Date for PT Re-Evaluation 07/20/15   Authorization Type UHC medicare   Authorization - Visit Number 4   Authorization - Number of Visits 10   PT Start Time 1500   PT Stop Time 1600   PT Time Calculation (min) 60 min   Activity Tolerance Patient tolerated treatment well   Behavior During Therapy [facility] for tasks assessed/performed      Past Medical History  Diagnosis Date  . NICM (nonischemic cardiomyopathy) (HCC)     LVEF 55% 3/11  . Coronary atherosclerosis of native coronary artery     Nonobstructive  . Essential hypertension, benign   . COPD (chronic obstructive pulmonary disease) (HCC)   . Pulmonary hypertension (HCC)   . Mitral regurgitation     Moderate  . Ventricular dysfunction, right   . Tricuspid regurgitation   . Lower extremity edema   . GERD (gastroesophageal reflux disease)   . DJD (degenerative joint disease)   . Bradycardia   . Paroxysmal atrial fibrillation (HCC)   . CKD (chronic kidney disease), stage III     Has had temporary dialysis in the past  . HOH (hard of hearing)     Past Surgical History  Procedure Laterality Date  . Total hip arthroplasty  ~ 2005    left  . Dialysis fistula creation      left forearm  . Cataract extraction w/ intraocular lens implant      right    There were no vitals filed for this visit.                  Wound Therapy - 07/01/15 1606    Subjective Pt states that his dressings felt better.  States they were not as tight as the last time therefore he had less pain    Pain Assessment No/denies pain   Wound Properties Date  First Assessed: 06/20/15 Time First Assessed: 1036 Wound Type: Other (Comment) Location: Leg Location Orientation: Right;Posterior Wound Description (Comments): constant weeping from distal 1/3 of posterior leg.   Dressing Type Compression wrap;Impregnated gauze (petrolatum)   Dressing Changed Changed   Dressing Status Old drainage   Dressing Change Frequency Every 3 days   Site / Wound Assessment Friable;Red   % Wound base Red or Granulating 95%   % Wound base Yellow 5%   Peri-wound Assessment Edema;Hemosiderin   Drainage Amount Scant   Drainage Description Serous   Treatment Cleansed;Debridement (Selective)   Wound Properties Date First Assessed: 06/20/15 Time First Assessed: 1036 Location: Toe (Comment  which one) Location Orientation: Right , 2nd toe   Wound Description (Comments): entire dorsal aspect    Dressing Type Impregnated gauze (bismuth);Silver hydrofiber   Dressing Changed Changed   Dressing Status Old drainage   Dressing Change Frequency Every 3 days   Site / Wound Assessment Friable   % Wound base Red or Granulating 90%   % Wound base Yellow 10%   Drainage Amount Moderate   Treatment Cleansed;Debridement (Selective)   Wound Properties Date First Assessed: 06/20/15 Wound Type: Other (Comment) Location: Leg Location Orientation: Left  Dressing Type Impregnated gauze (bismuth)   Dressing Changed Changed   Dressing Status Old drainage   Dressing Change Frequency Monday, Wednesday, Friday   % Wound base Red or Granulating 100%   % Wound base Yellow 0%   Peri-wound Assessment Hemosiderin   Drainage Amount Scant   Treatment Cleansed;Debridement (Selective)   Selective Debridement - Location B lower extremities including toes of right foot    Selective Debridement - Tools Used Forceps   Selective Debridement - Tissue Removed slough and dry sking    Wound Therapy - Clinical Statement Pt with improved comfort with bandages.  Rt toes continue to drain and thus cause  maceration.  Upon further inspection there is a wound  under the second toe at the MCP crease on the plantar aspect.  Toes and dorsal of foot with increased swelling when compared to ankle and leg on bilateral LE.  Pt wrapped with extra cotton at ankle to try and even out compression.  Right toes had silverhydrofiber placed on dorsal aspect where drainage is coming from but xeroform to MTP crease on plantar aspect.  Full toe bandages on left with just the big toe wrapped on the right with modified compression using 2' kling on the left. Both Le wrapped with profore compression dressing to decrease swelling .  Minimal manual completed today due to time restraints. Prescription for [employer] and insurance information faxed to [employer].   Wound Therapy - Functional Problem List difficulty walking, raising legs into the car, clothes fitting,    Factors Delaying/Impairing Wound Healing Altered sensation;Diabetes Mellitus;Multiple medical problems;Polypharmacy   Hydrotherapy Plan Debridement;Dressing change;Patient/family education;Other (comment)   Wound Plan continue with, debridement,  manual lymph techniques and compression dressing    Dressing  xeroform to LE, silver hydrofiber to dorsal aspect of right toes with xeroform to plantar aspect of MTP followed by multilayer compression wrap using profore.    Manual Therapy completed to B toes and dorsal aspect of feet.                  PT Education - 07/01/15 1624    Education provided Yes   Education Details Take dressings off if there is any increase of pain    Person(s) Educated Patient   Methods Explanation   Comprehension Verbalized understanding          PT Short Term Goals - 06/22/15 1338    PT SHORT TERM GOAL #1   Title Pt  drainage to derease to minimal to prevent soiling of patient's clothes/ shoes    Time 4   Period Weeks   Status Achieved   PT SHORT TERM GOAL #2   Title Pt edema to decrease by 30% to allow pt to fit  into shoes and new slippers    Time 4   Period Weeks   Status On-going   PT SHORT TERM GOAL #3   Title Pt pain to decrease to no greater than a 4/10 to allow pt to be up on his legs walking/standing for 10 minutes at a time to be able to make a quick meal in comfort.    Time 4   Period Weeks   Status On-going   PT SHORT TERM GOAL #4   Title Pt legs to feel 50% less heavy to allow pt to easily place legs into the car or on the bed.    Time 4   Period Weeks   Status On-going  PT Long Term Goals - 06/22/15 1338    PT LONG TERM GOAL #1   Title Pt to understand that lymphedma is a chronic condition and that he should wear compression at all times    Time 8   Period Weeks   Status On-going   PT LONG TERM GOAL #2   Title Pt to have recieved juxtafit and be able to don/doff gaments independently    Time 8   Period Weeks   Status On-going   PT LONG TERM GOAL #3   Title Pt to have not weeping or open area of LE to decrease risk of infection    Time 8   Period Weeks   Status On-going             Patient will benefit from skilled therapeutic intervention in order to improve the following deficits and impairments:     Visit Diagnosis: Lymphedema, not elsewhere classified  Difficulty in walking, not elsewhere classified  Leg heaviness  Nonhealing nonsurgical wound     Problem List Patient Active Problem List   Diagnosis Date Noted  . Edema 04/02/2012  . CKD (chronic kidney disease) stage 3, GFR 30-59 ml/min 02/17/2009  . HYPERLIPIDEMIA 08/20/2008  . Essential hypertension, benign 08/20/2008  . CORONARY ATHEROSCLEROSIS NATIVE CORONARY ARTERY 08/20/2008  . Secondary cardiomyopathy [facility]) 08/20/2008   Virgina Organ, PT CLT [phone] 07/01/2015, 4:27 PM  [facility] [address] [place], [place], [address] Phone: [phone]   Fax:  [phone]  Name: [name] Rice MRN: [id number] Date of Birth:  1935/10/04

## 2015-07-05 ENCOUNTER — Ambulatory Visit (HOSPITAL_COMMUNITY): Payer: Medicare Other | Admitting: Physical Therapy

## 2015-07-05 ENCOUNTER — Other Ambulatory Visit: Payer: Self-pay | Admitting: *Deleted

## 2015-07-05 DIAGNOSIS — I890 Lymphedema, not elsewhere classified: Secondary | ICD-10-CM

## 2015-07-05 DIAGNOSIS — R262 Difficulty in walking, not elsewhere classified: Secondary | ICD-10-CM

## 2015-07-05 DIAGNOSIS — R29898 Other symptoms and signs involving the musculoskeletal system: Secondary | ICD-10-CM | POA: Diagnosis not present

## 2015-07-05 DIAGNOSIS — T148 Other injury of unspecified body region: Secondary | ICD-10-CM | POA: Diagnosis not present

## 2015-07-05 NOTE — Therapy (Signed)
[facility] [address] [place], [place], [address] Phone: [phone]   Fax:  [phone]  Wound Care Therapy  Patient Details  Name: VINNY TARANTO MRN: [id number] Date of Birth: 04-Dec-1935 Referring Provider: Nita Sells  Encounter Date: 07/05/2015      PT End of Session - 07/05/15 1803    Visit Number 5   Number of Visits 16   Date for PT Re-Evaluation 07/20/15   Authorization Type UHC medicare   Authorization - Visit Number 5   Authorization - Number of Visits 10   PT Start Time 1500   PT Stop Time 1600   PT Time Calculation (min) 60 min   Activity Tolerance Patient tolerated treatment well   Behavior During Therapy [facility] for tasks assessed/performed      Past Medical History  Diagnosis Date  . NICM (nonischemic cardiomyopathy) (HCC)     LVEF 55% 3/11  . Coronary atherosclerosis of native coronary artery     Nonobstructive  . Essential hypertension, benign   . COPD (chronic obstructive pulmonary disease) (HCC)   . Pulmonary hypertension (HCC)   . Mitral regurgitation     Moderate  . Ventricular dysfunction, right   . Tricuspid regurgitation   . Lower extremity edema   . GERD (gastroesophageal reflux disease)   . DJD (degenerative joint disease)   . Bradycardia   . Paroxysmal atrial fibrillation (HCC)   . CKD (chronic kidney disease), stage III     Has had temporary dialysis in the past  . HOH (hard of hearing)     Past Surgical History  Procedure Laterality Date  . Total hip arthroplasty  ~ 2005    left  . Dialysis fistula creation      left forearm  . Cataract extraction w/ intraocular lens implant      right    There were no vitals filed for this visit.                  Wound Therapy - 07/05/15 1750    Subjective Pt arrived 30 minutes late for appt.  Reports no pain.  Caregiver reports he still sleeps in recliner and sits with feel down in dependent position.   Pain Assessment No/denies  pain   Wound Properties Date First Assessed: 06/20/15 Time First Assessed: 1036 Wound Type: Other (Comment) Location: Leg Location Orientation: Right;Posterior Wound Description (Comments): constant weeping from distal 1/3 of posterior leg.   Dressing Type Compression wrap;Impregnated gauze (petrolatum)   Dressing Changed Changed   Dressing Status Old drainage   Dressing Change Frequency Every 3 days   Site / Wound Assessment Friable;Red   % Wound base Red or Granulating 95%   % Wound base Yellow 5%   Peri-wound Assessment Edema;Hemosiderin   Drainage Amount Scant   Drainage Description Serous   Treatment Cleansed;Debridement (Selective)   Wound Properties Date First Assessed: 06/20/15 Time First Assessed: 1036 Location: Toe (Comment  which one) Location Orientation: Right , 2nd toe   Wound Description (Comments): entire dorsal aspect    Dressing Type Impregnated gauze (bismuth);Silver hydrofiber   Dressing Changed Changed   Dressing Status Old drainage   Dressing Change Frequency Every 3 days   Site / Wound Assessment Friable   % Wound base Red or Granulating 90%   % Wound base Yellow 10%   Drainage Amount Moderate   Treatment Cleansed;Debridement (Selective)   Wound Properties Date First Assessed: 06/20/15 Wound Type: Other (Comment) Location: Leg Location  Orientation: Left   Dressing Type Impregnated gauze (bismuth)   Dressing Changed Other (Comment)   Dressing Status Old drainage   Dressing Change Frequency Monday, Wednesday, Friday   % Wound base Red or Granulating 100%   % Wound base Yellow 0%   Peri-wound Assessment Hemosiderin   Drainage Amount Scant   Treatment Cleansed;Debridement (Selective)   Selective Debridement - Location B lower extremities including toes of right foot    Selective Debridement - Tools Used Forceps   Selective Debridement - Tissue Removed slough and dry skin    Wound Therapy - Clinical Statement Bandages intact.    Rt toes continue to drain and thus  cause maceration.  Appearance of fungal type wound on dorsal aspect of second, third and medial side of fourth toes.  Very small opening in plantar crease of third toe.   Lt toes without swelling, however continued swelling of Rt Toes.  Cleansed and debrided B LE's and feet well prior to moisturizing and rebandaging using profore system.  Used silverhydrofiber on dorsal aspect, between toes and into toe crease on palmar side.  No dressings needed on distal Rt/LT LE's.    No toe bandages used today, only the big toe wrapped on the right with modified compression using gauze on the left. Unable to complete manual today due to time constraints.   Wound Therapy - Functional Problem List difficulty walking, raising legs into the car, clothes fitting,    Factors Delaying/Impairing Wound Healing Altered sensation;Diabetes Mellitus;Multiple medical problems;Polypharmacy   Hydrotherapy Plan Debridement;Dressing change;Patient/family education;Other (comment)   Wound Plan continue with, debridement,  manual lymph techniques and compression dressing    Dressing  silver hydrofiber to dorsal and plantar aspects of toes, no dressing used on distal LE"s   Manual Therapy none completed today due to time                    PT Short Term Goals - 07/05/15 1811    PT SHORT TERM GOAL #1   Title Pt  drainage to derease to minimal to prevent soiling of patient's clothes/ shoes    Time 4   Period Weeks   Status Achieved   PT SHORT TERM GOAL #2   Title Pt edema to decrease by 30% to allow pt to fit into shoes and new slippers    Time 4   Period Weeks   Status On-going   PT SHORT TERM GOAL #3   Title Pt pain to decrease to no greater than a 4/10 to allow pt to be up on his legs walking/standing for 10 minutes at a time to be able to make a quick meal in comfort.    Time 4   Period Weeks   Status On-going   PT SHORT TERM GOAL #4   Title Pt legs to feel 50% less heavy to allow pt to easily place legs into  the car or on the bed.    Time 4   Period Weeks   Status On-going           PT Long Term Goals - 06/22/15 1338    PT LONG TERM GOAL #1   Title Pt to understand that lymphedma is a chronic condition and that he should wear compression at all times    Time 8   Period Weeks   Status On-going   PT LONG TERM GOAL #2   Title Pt to have recieved juxtafit and be able to don/doff gaments independently  Time 8   Period Weeks   Status On-going   PT LONG TERM GOAL #3   Title Pt to have not weeping or open area of LE to decrease risk of infection    Time 8   Period Weeks   Status On-going             Patient will benefit from skilled therapeutic intervention in order to improve the following deficits and impairments:     Visit Diagnosis: Lymphedema, not elsewhere classified  Difficulty in walking, not elsewhere classified     Problem List Patient Active Problem List   Diagnosis Date Noted  . Edema 04/02/2012  . CKD (chronic kidney disease) stage 3, GFR 30-59 ml/min 02/17/2009  . HYPERLIPIDEMIA 08/20/2008  . Essential hypertension, benign 08/20/2008  . CORONARY ATHEROSCLEROSIS NATIVE CORONARY ARTERY 08/20/2008  . Secondary cardiomyopathy (HCC) 08/20/2008    [name], PTA/CLT [phone]  07/05/2015, 6:14 PM  [facility] [address] [place], [place], [address] Phone: [phone]   Fax:  [phone]  Name: SYLVAIN BROCCOLI MRN: [id number] Date of Birth: 1935-07-29

## 2015-07-05 NOTE — Patient Outreach (Signed)
Referral received for [facility] RNCM community 06/27/15 Call placed to patient caregiver, [name], as patient does not have a telephone. Consent already given to speak with [name]. Spoke with [name] at length about her concerns. She states she has been patient caregiver for over 5 years.  Patient was never married and has not children, he has 2 sisters, who live out of town and they look to [facility] to Care for patient needs. She voices concerns that patient needs higher level of care such as a nursing home for long term care. She states she has to assist patient with meal, housekeeping, transportation, medication management. She states he has increased need of assistance with bathing, dressing, he is using a walker to ambulate. Currently he is going to outpatient rehab 2-3 times a week for lower leg wound care.  [name] agrees to home visit for next week, she states this week is full of appointments for both her and patient. Also, [name] requests co-visit with SW and nurse at same time if possible.  Assessment: Level of care concerns: RNCM will collaborate with [facility] SW regarding placement, facilities and Medicaid.  Plan: Initial home visit next week.  RNCM will collaborate with [facility] SW to attempt co-visit and will let [facility] SW know of patient caregiver concerns voiced above.  [name] Rice. Albertha Ghee, RN, BSN, CCM  [facility] [employer] [phone]

## 2015-07-06 ENCOUNTER — Ambulatory Visit (HOSPITAL_COMMUNITY): Payer: Medicare Other | Admitting: Physical Therapy

## 2015-07-07 DIAGNOSIS — R600 Localized edema: Secondary | ICD-10-CM | POA: Diagnosis not present

## 2015-07-07 DIAGNOSIS — R7301 Impaired fasting glucose: Secondary | ICD-10-CM | POA: Diagnosis not present

## 2015-07-07 DIAGNOSIS — N184 Chronic kidney disease, stage 4 (severe): Secondary | ICD-10-CM | POA: Diagnosis not present

## 2015-07-07 DIAGNOSIS — H1032 Unspecified acute conjunctivitis, left eye: Secondary | ICD-10-CM | POA: Diagnosis not present

## 2015-07-07 DIAGNOSIS — D519 Vitamin B12 deficiency anemia, unspecified: Secondary | ICD-10-CM | POA: Diagnosis not present

## 2015-07-08 ENCOUNTER — Ambulatory Visit (HOSPITAL_COMMUNITY): Payer: Medicare Other | Admitting: Physical Therapy

## 2015-07-08 DIAGNOSIS — T148 Other injury of unspecified body region: Secondary | ICD-10-CM | POA: Diagnosis not present

## 2015-07-08 DIAGNOSIS — R262 Difficulty in walking, not elsewhere classified: Secondary | ICD-10-CM | POA: Diagnosis not present

## 2015-07-08 DIAGNOSIS — I890 Lymphedema, not elsewhere classified: Secondary | ICD-10-CM | POA: Diagnosis not present

## 2015-07-08 DIAGNOSIS — R29898 Other symptoms and signs involving the musculoskeletal system: Secondary | ICD-10-CM | POA: Diagnosis not present

## 2015-07-08 NOTE — Therapy (Signed)
[facility] [address] [place], [place], [address] Phone: [phone]   Fax:  [phone]  Wound Care Therapy  Patient Details  Name: [name] Rice MRN: [id number] Date of Birth: [birth year] Referring Provider: Nita Sells  Encounter Date: 07/08/2015      PT End of Session - 07/08/15 1540    Visit Number 6   Number of Visits 16   Date for PT Re-Evaluation 07/20/15   Authorization Type UHC medicare   Authorization - Visit Number 6   Authorization - Number of Visits 10   PT Start Time 1435   PT Stop Time 1520   PT Time Calculation (min) 45 min   Activity Tolerance Patient tolerated treatment well   Behavior During Therapy [facility] for tasks assessed/performed      Past Medical History  Diagnosis Date  . NICM (nonischemic cardiomyopathy) (HCC)     LVEF 55% 3/11  . Coronary atherosclerosis of native coronary artery     Nonobstructive  . Essential hypertension, benign   . COPD (chronic obstructive pulmonary disease) (HCC)   . Pulmonary hypertension (HCC)   . Mitral regurgitation     Moderate  . Ventricular dysfunction, right   . Tricuspid regurgitation   . Lower extremity edema   . GERD (gastroesophageal reflux disease)   . DJD (degenerative joint disease)   . Bradycardia   . Paroxysmal atrial fibrillation (HCC)   . CKD (chronic kidney disease), stage III     Has had temporary dialysis in the past  . HOH (hard of hearing)     Past Surgical History  Procedure Laterality Date  . Total hip arthroplasty  ~ 2005    left  . Dialysis fistula creation      left forearm  . Cataract extraction w/ intraocular lens implant      right    There were no vitals filed for this visit.                  Wound Therapy - 07/08/15 1527    Subjective Pt states that he has no pain   Patient and Family Stated Goals wounds to heal    Date of Onset 06/12/15   Prior Treatments self care    Pain Assessment No/denies pain   Wound  Properties Date First Assessed: 06/20/15 Time First Assessed: 1036 Wound Type: Other (Comment) Location: Leg Location Orientation: Right;Posterior Wound Description (Comments): constant weeping from distal 1/3 of posterior leg. Final Assessment Date: 07/08/15 Final Assessment Time: 1529   Wound Properties Date First Assessed: 06/20/15 Time First Assessed: 1036 Location: Toe (Comment  which one) Location Orientation: Right , 2nd toe and third toe  Wound Description (Comments): entire dorsal aspect    Dressing Type Silver hydrofiber   Dressing Changed Changed   Dressing Status Old drainage   Dressing Change Frequency Every 3 days   Site / Wound Assessment Friable   % Wound base Red or Granulating 50%   % Wound base Other (Comment) 50%  white, raised    Peri-wound Assessment Edema   Drainage Amount Moderate   Treatment Cleansed;Debridement (Selective)   Wound Properties Date First Assessed: 06/20/15 Wound Type: Other (Comment) Location: Leg Location Orientation: Left Final Assessment Date: 07/08/15 Final Assessment Time: 1500   Selective Debridement - Location 2nd and 3rd toe of Rt LE    Selective Debridement - Tools Used Forceps   Selective Debridement - Tissue Removed slough   Wound Therapy - Clinical Statement Noted  increased swellin in legs just inferior to knees; swellig greater on left than rigfht.  All wounds on Lt LE are healed we will continue to use profore for compression until Juxtafit are available.  Pt wounds on Rt LE except for toes are healed.  Toes are draining moderately and have a smell.  Therapist suggested that patient brings over the counter yeast cream to nextt session to see if this assists in healing of dorsal aspect of toes.  Plantar aspect of toe still open and bleding at the MCP crease.  Continue with wound care until toes are healed.     Wound Therapy - Functional Problem List Pt ambulating easier but still has difficulty    Factors Delaying/Impairing Wound Healing  Altered sensation;Diabetes Mellitus;Multiple medical problems;Polypharmacy   Hydrotherapy Plan Debridement;Dressing change;Patient/family education   Wound Plan Trial of antifungal creame on toes.  Continue with profore until Juxtafit are available.    Dressing  silverhydrofiber to toes; profore to Bilateral lower extremities.    Manual Therapy to congested area in bilateral LE                    PT Short Term Goals - 07/05/15 1811    PT SHORT TERM GOAL #1   Title Pt  drainage to derease to minimal to prevent soiling of patient's clothes/ shoes    Time 4   Period Weeks   Status Achieved   PT SHORT TERM GOAL #2   Title Pt edema to decrease by 30% to allow pt to fit into shoes and new slippers    Time 4   Period Weeks   Status On-going   PT SHORT TERM GOAL #3   Title Pt pain to decrease to no greater than a 4/10 to allow pt to be up on his legs walking/standing for 10 minutes at a time to be able to make a quick meal in comfort.    Time 4   Period Weeks   Status On-going   PT SHORT TERM GOAL #4   Title Pt legs to feel 50% less heavy to allow pt to easily place legs into the car or on the bed.    Time 4   Period Weeks   Status On-going           PT Long Term Goals - 06/22/15 1338    PT LONG TERM GOAL #1   Title Pt to understand that lymphedma is a chronic condition and that he should wear compression at all times    Time 8   Period Weeks   Status On-going   PT LONG TERM GOAL #2   Title Pt to have recieved juxtafit and be able to don/doff gaments independently    Time 8   Period Weeks   Status On-going   PT LONG TERM GOAL #3   Title Pt to have not weeping or open area of LE to decrease risk of infection    Time 8   Period Weeks   Status On-going             Patient will benefit from skilled therapeutic intervention in order to improve the following deficits and impairments:     Visit Diagnosis: Lymphedema, not elsewhere classified  Difficulty  in walking, not elsewhere classified     Problem List Patient Active Problem List   Diagnosis Date Noted  . Edema 04/02/2012  . CKD (chronic kidney disease) stage 3, GFR 30-59 ml/min 02/17/2009  . HYPERLIPIDEMIA 08/20/2008  .  Essential hypertension, benign 08/20/2008  . CORONARY ATHEROSCLEROSIS NATIVE CORONARY ARTERY 08/20/2008  . Secondary cardiomyopathy [facility]) 08/20/2008   Virgina Organ, PT CLT [phone] 07/08/2015, 3:42 PM  [facility] [address] [place], [place], [address] Phone: [phone]   Fax:  [phone]  Name: [name] Rice MRN: [id number] Date of Birth: 05-13-1935

## 2015-07-11 ENCOUNTER — Encounter: Payer: Self-pay | Admitting: *Deleted

## 2015-07-11 ENCOUNTER — Ambulatory Visit: Payer: Self-pay | Admitting: Licensed Clinical Social Worker

## 2015-07-11 ENCOUNTER — Other Ambulatory Visit: Payer: Self-pay | Admitting: Licensed Clinical Social Worker

## 2015-07-11 ENCOUNTER — Ambulatory Visit (HOSPITAL_COMMUNITY): Payer: Medicare Other | Admitting: Physical Therapy

## 2015-07-11 ENCOUNTER — Other Ambulatory Visit: Payer: Self-pay | Admitting: *Deleted

## 2015-07-11 DIAGNOSIS — I890 Lymphedema, not elsewhere classified: Secondary | ICD-10-CM

## 2015-07-11 DIAGNOSIS — T148 Other injury of unspecified body region: Secondary | ICD-10-CM | POA: Diagnosis not present

## 2015-07-11 DIAGNOSIS — R262 Difficulty in walking, not elsewhere classified: Secondary | ICD-10-CM

## 2015-07-11 DIAGNOSIS — R29898 Other symptoms and signs involving the musculoskeletal system: Secondary | ICD-10-CM | POA: Diagnosis not present

## 2015-07-11 NOTE — Patient Outreach (Signed)
[facility]) Care Management   07/11/2015  [name] Rice 1935/12/06 [id number]  [name] Rice is an 80 y.o. male  Co-visit with [facility] LCSW, [name] Rice and patient cousin and caregiver, [name] Rice  Subjective: Patient reports he had to go over to wound center this am, goes 3 times a week, has a $40 co-pay each visit. Patient had a fall in January, did not go to ED, denies any injury, patient uses walker for ambulation, live alone but has support from his cousin, [name] Rice, they both verbalize that patient has increased level of care needs. Last hospitalization was about 3 years ago, states it was for cellulitis Patient does not have phone, only has 2 call bell lights he can pull in bathroom and bedroom.   Caregiver reporting patient level of care needs have increased over the last Rice months.  She is with patient at least 4 times a week. She has her own issues. She states that they had agreed that when patient care needs increased that he would go to placement.  Patient has 2 sisters but they live out of town and are not involved in patient everyday care.  Objective:   BP 108/60 mmHg  Pulse 60  Resp 20  Ht 1.727 m (5\' 8" )  Wt 146 lb (66.225 kg)  BMI 22.20 kg/m2  SpO2 90% Review of Systems  Constitutional: Negative.   Eyes: Positive for discharge.       Left eye, has eye drop to use  Genitourinary: Positive for frequency.       Diuretic use  Musculoskeletal: Positive for joint pain and falls.  Skin:       Legs wrapped from outpatient rehab    Physical Exam  Constitutional: He is oriented to person, place, and time. He appears well-developed and well-nourished.  Cardiovascular: Normal rate.   GI: Soft.  Musculoskeletal:  Unsteady gait, uses walker  Neurological: He is alert and oriented to person, place, and time.  Skin:  Cellulitis in lower extremities, wrapped up to knee    Encounter Medications:   Outpatient Encounter Prescriptions as of  07/11/2015  Medication Sig Note  . aspirin 81 MG tablet Take 81 mg by mouth daily.     . carvedilol (COREG) 6.25 MG tablet take 1 tablet by mouth twice a day with meals   . Cholecalciferol (VITAMIN D3) 1000 UNITS CAPS Take 1 capsule by mouth daily.   . cyanocobalamin (,VITAMIN B-12,) 1000 MCG/ML injection  08/19/2014: Received from: External Pharmacy  . FERREX 150 150 MG capsule Take 150 mg by mouth daily. Reported on 07/11/2015 02/21/2015: Received from: External Pharmacy  . furosemide (LASIX) 40 MG tablet Take 40 mg by mouth daily.   . hydrALAZINE (APRESOLINE) 50 MG tablet Take 1 tablet (50 mg total) by mouth 3 (three) times daily.   . Multiple Vitamins-Minerals (VISION-VITE PRESERVE PO) Take by mouth.   . simvastatin (ZOCOR) 5 MG tablet Take 1 tablet (5 mg total) by mouth at bedtime.   . sulfacetamide (BLEPH-10) 10 % ophthalmic solution 1 drop every 4 (four) hours. 07/11/2015: Left eye  . tiotropium (SPIRIVA) 18 MCG inhalation capsule Place 18 mcg into inhaler and inhale daily.     No facility-administered encounter medications on file as of 07/11/2015.    Functional Status:   In your present state of health, do you have any difficulty performing the following activities: 06/27/2015 06/27/2015  Hearing? Malvin Johns  Vision? N N  Difficulty concentrating or making decisions? Jeannie Fend  Y  Walking or climbing stairs? Y Y  Dressing or bathing? Y Y  Doing errands, shopping? Malvin Johns  Preparing Food and eating ? Y Y  Using the Toilet? N N  In the past six months, have you accidently leaked urine? Y Y  Do you have problems with loss of bowel control? N N  Managing your Medications? Y Y  Managing your Finances? Malvin Johns  Housekeeping or managing your Housekeeping? Y Y    Fall/Depression Screening:    Fall Risk  06/27/2015 06/27/2015 06/22/2015  Falls in the past year? Yes Yes Yes  Number falls in past yr: Injury with Fall? No No No  Risk for fall due to : Impaired mobility;Impaired balance/gait;History of fall(s)  History of fall(s);Impaired mobility History of fall(s);Impaired mobility  Follow up Education provided Education provided Education provided   [facility] 2/9 Scores 06/27/2015  PHQ - 2 Score 2  PHQ- 9 Score 6    Assessment:   Fall risk-recent fall,patient very unsteady on his feet, uses walker for ambulation. Legs are wrapped tightly and sometimes burn. Patient does not have a phone, but does have call bell alarm in bathroom and bedroom.  Cellulitis-legs being wrapped 3x week at outpatient PT/rehab   Level of care issue-increased level of care as verbalized by patient and caregiver. [facility] LCSW consulted with patient and caregiver in steps in getting Medicaid and placement.  COPD-Started education on COPD action plan  Plan:  [facility] CM Care Plan Problem One        Most Recent Value   Care Plan Problem One  Level of Care issues as evidenced by caregiver verbalization of patient needs   Role Documenting the Problem One  Care Management Coordinator   Care Plan for Problem One  Active   [facility] Long Term Goal (31-90 days)  Patient will have appropriate level of care placement in the next 60 days   [facility] Long Term Goal Start Date  07/05/15   Interventions for Problem One Long Term Goal  assessment for level of care needs completed with patient, caregiver and LCSW,reviewed next steps in process for possible placement   [facility] CM Short Term Goal #1 (0-30 days)  Patient and caregiver will have visited long term care facility for possible placement over the next 30 days   [facility] CM Short Term Goal #1 Start Date  07/11/15   Interventions for Short Term Goal #1  using teachback reviewed next steps and encouraged visitation to local ALF and LTC facilities    [facility] CM Care Plan Problem Two        Most Recent Value   Care Plan Problem Two  COPD knowlledge deficit   Role Documenting the Problem Two  Care Management Coordinator   Care Plan for Problem Two  Active   Interventions for Problem Two Long Term Goal   Using  teachback, reviewed COPD action plan in blue [facility] calendar   [facility] Long Term Goal (31-90) days  Patient will have knowledge of COPD action plan in the next 31 days   [facility] Long Term Goal Start Date  07/11/15     Send MD initial encounter, request he evaluate patient for FL-2 and level of care Patient caregiver will follow up with RN and [facility] LCSW in a couple of weeks with update in process.  Caregiver will speak with patient sister about situation and get her input.  Alben Spittle. Albertha Ghee, RN, BSN, CCM  [facility] [employer] [phone]

## 2015-07-11 NOTE — Patient Outreach (Signed)
[facility] ([facility]) Care Management  [facility] Social Work  07/11/2015  Ronrico L Givan 09/14/1935 [id number]  Subjective:    Objective:   Encounter Medications:  Outpatient Encounter Prescriptions as of 07/11/2015  Medication Sig Note  . aspirin 81 MG tablet Take 81 mg by mouth daily.     . carvedilol (COREG) 6.25 MG tablet take 1 tablet by mouth twice a day with meals   . Cholecalciferol (VITAMIN D3) 1000 UNITS CAPS Take 1 capsule by mouth daily.   . cyanocobalamin (,VITAMIN B-12,) 1000 MCG/ML injection  08/19/2014: Received from: External Pharmacy  . FERREX 150 150 MG capsule Take 150 mg by mouth daily. 02/21/2015: Received from: External Pharmacy  . furosemide (LASIX) 40 MG tablet Take 40 mg by mouth daily.   . hydrALAZINE (APRESOLINE) 50 MG tablet Take 1 tablet (50 mg total) by mouth 3 (three) times daily.   . Multiple Vitamins-Minerals (VISION-VITE PRESERVE PO) Take by mouth.   . simvastatin (ZOCOR) 5 MG tablet Take 1 tablet (5 mg total) by mouth at bedtime.   . tiotropium (SPIRIVA) 18 MCG inhalation capsule Place 18 mcg into inhaler and inhale daily.     No facility-administered encounter medications on file as of 07/11/2015.    Functional Status:  In your present state of health, do you have any difficulty performing the following activities: 06/27/2015 06/27/2015  Hearing? Y Y  Vision? N N  Difficulty concentrating or making decisions? Y Y  Walking or climbing stairs? Y Y  Dressing or bathing? Y Y  Doing errands, shopping? Y Y  Preparing Food and eating ? Y Y  Using the Toilet? N N  In the past six months, have you accidently leaked urine? Y Y  Do you have problems with loss of bowel control? N N  Managing your Medications? Y Y  Managing your Finances? Y Y  Housekeeping or managing your Housekeeping? Y Y    Fall/Depression Screening:  PHQ 2/9 Scores 06/27/2015  PHQ - 2 Score 2  PHQ- 9 Score 6    Assessment:   CSW traveled to home of client on 07/11/15 and  met with client on 07/11/15 at client's home. [name] Dockery, cousin and caregiver for client was also present at home of client for visit.  CSW and client spoke of client care plan. CSW encouraged client to communicate as needed with CSW in next 30 days to discuss community resources of assistance to client. CSW spoke with [name] and client on 07/11/15 about Medicaid application process for client.  CSW gave [name] a blank Medicaid application form to review to determine information she would need to gather with client to help client apply for Medicaid.  CSW gave [name] and client a list of several skilled nursing facilities in the area.  CSW also gave [name] and client a list of several assisted living facilities in the area. CSW reviewed with [name] and Samson the documents needed for client to help client apply for Medicaid.  CSW and [place] spoke of fall potential for client. [place] uses a walker to ambulate. He has his legs wrapped/bandaged below his knees due to skin care issues. He goes 3 times weekly to local wound center to have his legs wrapped/bandaged. CSW and RN Mary Niemczura collaborated on 07/11/15.  Mary said she plans to send a recent nursing note she wrote to Dr. Hall on 07/11/15 and ask Dr. Hall to consider completing FL-2 for client.  Client is receiving increased daily care from [name] Dockery and   client has fall potential in the home.  Client has two pull cords in the home he uses for emergency situations for assistance.  Mary and CSW to collaborate in monitoring needs of client.  CSW also recommended that client/[name] consider touring skilled nursing facility or assisted living facility in the area to become familiar with care options in facilities near client. CSW thanked client and [name] for home visit on 07/11/15. CSW gave client/[name] [facility] card with [facility] CSW number on card ([phone]).  CSW encouraged client or [name] to call CSW as needed to discuss social work needs of client.    Plan:    Client to  communicate as needed with CSW in next 30 days to discuss community resources of assistance to client. CSW to collaborate with RN Mary Niemczura as needed to monitor status/needs of client. CSW to call client/[name] Docekery in two weeks to assess client needs.    S. MSW, LCSW Licensed Clinical Social Worker [facility] Care Management [phone]  

## 2015-07-11 NOTE — Therapy (Signed)
[facility] [address] [place], [place], [address] Phone: [phone]   Fax:  [phone]  Wound Care Therapy  Patient Details  Name: [name] Rice MRN: [id number] Date of Birth: 10/20/1935 Referring Provider: Nita Sells  Encounter Date: 07/11/2015      PT End of Session - 07/11/15 1224    Visit Number 7   Number of Visits 16   Date for PT Re-Evaluation 07/20/15   Authorization Type [employer] medicare   Authorization - Visit Number 7   Authorization - Number of Visits 10   PT Start Time 0945   PT Stop Time 1100   PT Time Calculation (min) 75 min   Activity Tolerance Patient tolerated treatment well   Behavior During Therapy [facility] for tasks assessed/performed      Past Medical History  Diagnosis Date  . NICM (nonischemic cardiomyopathy) (HCC)     LVEF 55% 3/11  . Coronary atherosclerosis of native coronary artery     Nonobstructive  . Essential hypertension, benign   . COPD (chronic obstructive pulmonary disease) (HCC)   . Pulmonary hypertension (HCC)   . Mitral regurgitation     Moderate  . Ventricular dysfunction, right   . Tricuspid regurgitation   . Lower extremity edema   . GERD (gastroesophageal reflux disease)   . DJD (degenerative joint disease)   . Bradycardia   . Paroxysmal atrial fibrillation (HCC)   . CKD (chronic kidney disease), stage III     Has had temporary dialysis in the past  . HOH (hard of hearing)     Past Surgical History  Procedure Laterality Date  . Total hip arthroplasty  ~ 2005    left  . Dialysis fistula creation      left forearm  . Cataract extraction w/ intraocular lens implant      right    There were no vitals filed for this visit.                  Wound Therapy - 07/11/15 1220    Subjective Pt's CG reports someone is coming this afternoon to assess his needs and decide if he is safe at home or can go to ALF.  Pt without c/o pain.   Patient and Family Stated Goals  wounds to heal    Date of Onset 06/12/15   Prior Treatments self care    Pain Assessment No/denies pain   Wound Properties Date First Assessed: 06/20/15 Time First Assessed: 1036 Location: Toe (Comment  which one) Location Orientation: Right , 2nd toe and third toe  Wound Description (Comments): entire dorsal aspect    Dressing Type Silver hydrofiber   Dressing Changed Changed   Dressing Status Old drainage   Dressing Change Frequency Every 3 days   Site / Wound Assessment Friable   % Wound base Red or Granulating 50%   % Wound base Other (Comment) 50%   Peri-wound Assessment Edema   Drainage Amount Moderate   Treatment Cleansed   Selective Debridement - Location B LE's and Rt 2nd/3rd toes   Selective Debridement - Tools Used Forceps   Selective Debridement - Tissue Removed slough, dry skin   Wound Therapy - Clinical Statement B feet were wet due to rain upon arrival.  Also noted BM down Rt posterior LE and on  pants.  Looked as if had been there for a couple days as was dry/hardened.  Pointed out to CG that he would need to change his pants  when got back home.  Bilateral LE's cleansed and debrided of dry skin.  Toes cleaned well between toes.  CG brought antifungal cream to apply on toes (per PT instruction).  Continued with silver hydrofiber on toes, no toe wraps and profore bilaterally.  moisturrzied LE's well.  Pt reported he has been sleeping in his bed.  Instructed patient not to scratch at LE's as well.  Overall improvement noted in bialteral LE"s.    Hydrotherapy Plan Debridement;Dressing change;Patient/family education   Dressing  silverhydrofiber to toes; profore to Bilateral lower extremities.    Manual Therapy to congested area in bilateral LE                    PT Short Term Goals - 07/11/15 1225    PT SHORT TERM GOAL #1   Title Pt  drainage to derease to minimal to prevent soiling of patient's clothes/ shoes    Time 4   Period Weeks   Status Achieved   PT SHORT  TERM GOAL #2   Title Pt edema to decrease by 30% to allow pt to fit into shoes and new slippers    Time 4   Period Weeks   Status On-going   PT SHORT TERM GOAL #3   Title Pt pain to decrease to no greater than a 4/10 to allow pt to be up on his legs walking/standing for 10 minutes at a time to be able to make a quick meal in comfort.    Time 4   Period Weeks   Status On-going   PT SHORT TERM GOAL #4   Title Pt legs to feel 50% less heavy to allow pt to easily place legs into the car or on the bed.    Time 4   Period Weeks   Status On-going           PT Long Term Goals - 07/11/15 1225    PT LONG TERM GOAL #1   Title Pt to understand that lymphedma is a chronic condition and that he should wear compression at all times    Time 8   Period Weeks   Status On-going   PT LONG TERM GOAL #2   Title Pt to have recieved juxtafit and be able to don/doff gaments independently    Time 8   Period Weeks   Status On-going   PT LONG TERM GOAL #3   Title Pt to have not weeping or open area of LE to decrease risk of infection    Time 8   Period Weeks   Status On-going             Patient will benefit from skilled therapeutic intervention in order to improve the following deficits and impairments:     Visit Diagnosis: Lymphedema, not elsewhere classified  Difficulty in walking, not elsewhere classified     Problem List Patient Active Problem List   Diagnosis Date Noted  . Edema 04/02/2012  . CKD (chronic kidney disease) stage 3, GFR 30-59 ml/min 02/17/2009  . HYPERLIPIDEMIA 08/20/2008  . Essential hypertension, benign 08/20/2008  . CORONARY ATHEROSCLEROSIS NATIVE CORONARY ARTERY 08/20/2008  . Secondary cardiomyopathy (HCC) 08/20/2008    [name], PTA/CLT [phone]  07/11/2015, 12:28 PM  [facility] [address] [place], [place], [address] Phone: [phone]   Fax:  [phone]  Name: [name] Rice MRN:  [id number] Date of Birth: 1935/06/09

## 2015-07-12 ENCOUNTER — Encounter (HOSPITAL_COMMUNITY)
Admission: RE | Admit: 2015-07-12 | Discharge: 2015-07-12 | Disposition: A | Discharge status: Home / Self Care | Payer: Medicare Other | Source: Ambulatory Visit | Attending: Nephrology | Admitting: Nephrology

## 2015-07-12 DIAGNOSIS — D638 Anemia in other chronic diseases classified elsewhere: Secondary | ICD-10-CM | POA: Diagnosis not present

## 2015-07-12 DIAGNOSIS — N184 Chronic kidney disease, stage 4 (severe): Secondary | ICD-10-CM | POA: Diagnosis not present

## 2015-07-12 LAB — HEMOGLOBIN AND HEMATOCRIT, BLOOD
HEMATOCRIT: 27.9 % — AB (ref 39.0–52.0)
HEMOGLOBIN: 8.9 g/dL — AB (ref 13.0–17.0)

## 2015-07-12 MED ORDER — EPOETIN ALFA 2000 UNIT/ML IJ SOLN
INTRAMUSCULAR | Status: AC
  Filled 2015-07-12: qty 1

## 2015-07-12 MED ORDER — EPOETIN ALFA 2000 UNIT/ML IJ SOLN
2000.0000 [IU] | INTRAMUSCULAR | Status: DC
  Administered 2015-07-12: 2000 [IU] via SUBCUTANEOUS

## 2015-07-12 NOTE — Progress Notes (Signed)
Results for PAYNE, TESKEY (MRN [id number]) as of 07/12/2015 09:26  Ref. Range 07/12/2015 08:47  Hemoglobin Latest Ref Range: 13.0-17.0 g/dL 8.9 (L)  HCT Latest Ref Range: 39.0-52.0 % 27.9 (L)   Procrit 2000 units administered

## 2015-07-13 ENCOUNTER — Ambulatory Visit (HOSPITAL_COMMUNITY): Payer: Medicare Other | Admitting: Physical Therapy

## 2015-07-13 DIAGNOSIS — R29898 Other symptoms and signs involving the musculoskeletal system: Secondary | ICD-10-CM | POA: Diagnosis not present

## 2015-07-13 DIAGNOSIS — I890 Lymphedema, not elsewhere classified: Secondary | ICD-10-CM | POA: Diagnosis not present

## 2015-07-13 DIAGNOSIS — R262 Difficulty in walking, not elsewhere classified: Secondary | ICD-10-CM | POA: Diagnosis not present

## 2015-07-13 DIAGNOSIS — T148XXA Other injury of unspecified body region, initial encounter: Secondary | ICD-10-CM

## 2015-07-13 DIAGNOSIS — T148 Other injury of unspecified body region: Secondary | ICD-10-CM | POA: Diagnosis not present

## 2015-07-13 NOTE — Therapy (Signed)
[facility] [address] [place], [place], [address] Phone: [phone]   Fax:  [phone]  Wound Care Therapy  Patient Details  Name: [name] Rice MRN: [id number] Date of Birth: [birth year] Referring Provider: Nita Sells  Encounter Date: 07/13/2015      PT End of Session - 07/13/15 1216    Visit Number 8   Number of Visits 16   Date for PT Re-Evaluation 07/20/15   Authorization Type [employer]HC medicare   Authorization - Visit Number 8   Authorization - Number of Visits 10   PT Start Time 1040   PT Stop Time 1150   PT Time Calculation (min) 70 min   Activity Tolerance Patient tolerated treatment well   Behavior During Therapy [facility] for tasks assessed/performed      Past Medical History  Diagnosis Date  . NICM (nonischemic cardiomyopathy) (HCC)     LVEF 55% 3/11  . Coronary atherosclerosis of native coronary artery     Nonobstructive  . Essential hypertension, benign   . COPD (chronic obstructive pulmonary disease) (HCC)   . Pulmonary hypertension (HCC)   . Mitral regurgitation     Moderate  . Ventricular dysfunction, right   . Tricuspid regurgitation   . Lower extremity edema   . GERD (gastroesophageal reflux disease)   . DJD (degenerative joint disease)   . Bradycardia   . Paroxysmal atrial fibrillation (HCC)   . CKD (chronic kidney disease), stage III     Has had temporary dialysis in the past  . HOH (hard of hearing)     Past Surgical History  Procedure Laterality Date  . Total hip arthroplasty  ~ 2005    left  . Dialysis fistula creation      left forearm  . Cataract extraction w/ intraocular lens implant      right    There were no vitals filed for this visit.       Subjective Assessment - 07/13/15 1207    Subjective CG reports pt will be transitioning to a skilled facility and may take 6-8 weeks.  He can no longer care for himself independently.  No complaints or pain voiced.   Currently in Pain? No/denies                    Wound Therapy - 07/13/15 1208    Subjective Pt is to transitioi to a skilled facility.  No pain reported.    Patient and Family Stated Goals wounds to heal    Date of Onset 06/12/15   Prior Treatments self care    Pain Assessment No/denies pain   Wound Properties Date First Assessed: 06/20/15 Time First Assessed: 1036 Location: Toe (Comment  which one) Location Orientation: Right , 2nd toe and third toe  Wound Description (Comments): entire dorsal aspect    Dressing Type Silver hydrofiber   Dressing Changed Changed   Dressing Status Old drainage   Dressing Change Frequency Every 3 days   Site / Wound Assessment Friable   % Wound base Red or Granulating 100%   Peri-wound Assessment Edema   Drainage Amount Scant   Treatment Cleansed;Debridement (Selective)   Selective Debridement - Location B LE's and 2nd/3rd toe   Selective Debridement - Tools Used Forceps   Selective Debridement - Tissue Removed slough, dry skin   Wound Therapy - Clinical Statement Much improved condition of skin and toes in general, however swelling bilateral knees noted.  CG states patient does have a  lot of arthritis in his knees that may also be contributing to this.  Bilateral LE's cleansed and debrided of dry skin.  Toes cleaned well between toes.  Large amount of time spend on massage this session with focus on knees.  Continued with antifungal cream to  toes and silver hydrofiber on toes, no toe wraps and profore bilaterally.  moisturrzied LE's well.  Pt reported he has been sleeping in his bed. Reiterated importance of keeping LE's elevated when up in his recliner.  Pt verbalized understanding. Therapist to follow up with compression garment order as he needs measurements ASAP.     Hydrotherapy Plan Debridement;Dressing change;Patient/family education   Wound Plan Continue with manual lymph drainage, debridment and appropriate dressing changes.  Follow up with [facility] medical ASAP    Dressing  silverhydrofiber to toes; profore to Bilateral lower extremities.    Manual Therapy manual lymph drainage to bilateral LE's including short neck and inguinal axillary anastomosis.                   PT Short Term Goals - 07/13/15 1216    PT SHORT TERM GOAL #1   Title Pt  drainage to derease to minimal to prevent soiling of patient's clothes/ shoes    Time 4   Period Weeks   Status Achieved   PT SHORT TERM GOAL #2   Title Pt edema to decrease by 30% to allow pt to fit into shoes and new slippers    Time 4   Period Weeks   Status On-going   PT SHORT TERM GOAL #3   Title Pt pain to decrease to no greater than a 4/10 to allow pt to be up on his legs walking/standing for 10 minutes at a time to be able to make a quick meal in comfort.    Time 4   Period Weeks   Status On-going   PT SHORT TERM GOAL #4   Title Pt legs to feel 50% less heavy to allow pt to easily place legs into the car or on the bed.    Time 4   Period Weeks   Status On-going           PT Long Term Goals - 07/13/15 1216    PT LONG TERM GOAL #1   Title Pt to understand that lymphedma is a chronic condition and that he should wear compression at all times    Time 8   Period Weeks   Status On-going   PT LONG TERM GOAL #2   Title Pt to have recieved juxtafit and be able to don/doff gaments independently    Time 8   Period Weeks   Status On-going   PT LONG TERM GOAL #3   Title Pt to have not weeping or open area of LE to decrease risk of infection    Time 8   Period Weeks   Status On-going             Patient will benefit from skilled therapeutic intervention in order to improve the following deficits and impairments:     Visit Diagnosis: Lymphedema, not elsewhere classified  Difficulty in walking, not elsewhere classified  Leg heaviness  Nonhealing nonsurgical wound     Problem List Patient Active Problem List   Diagnosis Date Noted  . Edema 04/02/2012  . CKD  (chronic kidney disease) stage 3, GFR 30-59 ml/min 02/17/2009  . HYPERLIPIDEMIA 08/20/2008  . Essential hypertension, benign 08/20/2008  . CORONARY ATHEROSCLEROSIS NATIVE  CORONARY ARTERY 08/20/2008  . Secondary cardiomyopathy (HCC) 08/20/2008    [name] Rice, PTA/CLT [phone]  07/13/2015, 12:18 PM  [facility] [address] [place], [place], [address] Phone: [phone]   Fax:  [phone]  Name: BRAIDAN RICCIARDI MRN: [id number] Date of Birth: 12-06-1935

## 2015-07-15 ENCOUNTER — Ambulatory Visit (HOSPITAL_COMMUNITY): Payer: Medicare Other | Admitting: Physical Therapy

## 2015-07-15 DIAGNOSIS — T148 Other injury of unspecified body region: Secondary | ICD-10-CM | POA: Diagnosis not present

## 2015-07-15 DIAGNOSIS — I890 Lymphedema, not elsewhere classified: Secondary | ICD-10-CM

## 2015-07-15 DIAGNOSIS — R262 Difficulty in walking, not elsewhere classified: Secondary | ICD-10-CM

## 2015-07-15 DIAGNOSIS — R29898 Other symptoms and signs involving the musculoskeletal system: Secondary | ICD-10-CM | POA: Diagnosis not present

## 2015-07-15 NOTE — Therapy (Signed)
[name], [place], [address] Phone: [phone]   Fax:  [phone]  Wound Care Therapy Reassessment  Patient Details  Name: [name] Rice MRN: [id number] Date of Birth: 01-31-1936 Referring Provider: Allyn Kenner  Encounter Date: 07/15/2015      PT End of Session - 07/15/15 1220    Visit Number 9   Number of Visits 16   Date for PT Re-Evaluation 08/14/15   Authorization Type UHC medicare   Authorization - Visit Number 9   Authorization - Number of Visits 16   PT Start Time 3559   PT Stop Time 1207   PT Time Calculation (min) 84 min   Activity Tolerance Patient tolerated treatment well   Behavior During Therapy [facility] for tasks assessed/performed      Past Medical History  Diagnosis Date  . NICM (nonischemic cardiomyopathy) ([place])     LVEF 55% 3/11  . Coronary atherosclerosis of native coronary artery     Nonobstructive  . Essential hypertension, benign   . COPD (chronic obstructive pulmonary disease) ([place])   . Pulmonary hypertension ([place])   . Mitral regurgitation     Moderate  . Ventricular dysfunction, right   . Tricuspid regurgitation   . Lower extremity edema   . GERD (gastroesophageal reflux disease)   . DJD (degenerative joint disease)   . Bradycardia   . Paroxysmal atrial fibrillation (HCC)   . CKD (chronic kidney disease), stage III     Has had temporary dialysis in the past  . HOH (hard of hearing)     Past Surgical History  Procedure Laterality Date  . Total hip arthroplasty  ~ 2005    left  . Dialysis fistula creation      left forearm  . Cataract extraction w/ intraocular lens implant      right    There were no vitals filed for this visit.            LYMPHEDEMA/ONCOLOGY QUESTIONNAIRE - 07/15/15 1058    What other symptoms do you have   Are you Having Heaviness or Tightness No   Are you having pitting edema Yes   Body Site thigh   Is it Hard or Difficult finding  clothes that fit No   Do you have infections No   Stemmer Sign Yes   Lymphedema Stage   Stage STAGE 2 SPONTANEOUSLY IRREVERSIBLE   Right Lower Extremity Lymphedema   At Midpatella/Popliteal Crease 51.5 cm   30 cm Proximal to Floor at Lateral Plantar Foot 36 cm  was 40.5   20 cm Proximal to Floor at Lateral Plantar Foot 27.3 1  was 39.8   10 cm Proximal to Floor at Lateral Malleoli 16.5 cm  was 30   Circumference of ankle/heel 35 cm.  was 37.5   5 cm Proximal to 1st MTP Joint 25.8 cm  was 30   Across MTP Joint 27.5 cm  was 30.2   Around Proximal Great Toe 11 cm  was 13   Left Lower Extremity Lymphedema   At Midpatella/Popliteal Crease 47 cm   30 cm Proximal to Floor at Lateral Plantar Foot 32 cm  was36.3   20 cm Proximal to Floor at Lateral Plantar Foot 26.5 cm  was 31.5   10 cm Proximal to Floor at Lateral Malleoli 25 cm  was 28.8   Circumference of ankle/heel 34.2 cm.  was 35.7   5 cm Proximal to 1st MTP Joint 26 cm  was 28.6   Across MTP Joint 26.3 cm  was 27.8   Around Proximal Great Toe 11 cm  was 12.0        Initially both LE were having copious serous drainage from B LE.  B LE had open areas circumferentially.       Wound Therapy - 07/15/15 1209    Subjective Pt denies any pain    Patient and Family Stated Goals wounds to heal    Date of Onset 06/12/15   Prior Treatments self care    Pain Assessment No/denies pain   Wound Properties Date First Assessed: 06/20/15 Time First Assessed: 1036 Location: Toe (Comment  which one) Location Orientation: Right , 2nd toe and third toe  Wound Description (Comments): entire dorsal aspect    Dressing Type Gauze (Comment)  antifungal medicine    Dressing Changed Changed   Dressing Status Old drainage   Dressing Change Frequency Every 3 days   Site / Wound Assessment Friable   % Wound base Red or Granulating 100%   Peri-wound Assessment Edema   Drainage Amount Scant   Treatment Cleansed;Debridement (Selective)    Selective Debridement - Location B LE's and 2nd/3rd toe   Selective Debridement - Tools Used Forceps   Selective Debridement - Tissue Removed ,dry skin   Wound Therapy - Clinical Statement Significant decreased edema.  All wounds on Lt LE are healed.  Pt has slight skin abrasion of 1 cm diameter on medial aspect of Lt LE.  Pt wound on plantar aspect of MTP joint is almost healed with dressing changed from silverhydrofiber to xeroform.  Dorsal aspect of first, second and third toes dorsal aspect improved.  If pt LE look this good next week may decrease to one time a week but for now we will dicrease to two times a week for the next three weeks.  Pt order for juxtafit resent to [place] Debridement;Dressing change;Patient/family education   Wound Plan Decrease to 2 x a week; but if LE continue to do well can decrease to one time a week next week    Dressing  xeroform to plantar aspect of MTP jt of Lt LE; antifungal cream to dorsal aspect of toes followed by toe wrapping with 2" kiling; B LE recieved multilayer compression dressing using profore.    Manual Therapy manual lymph drainage to bilateral LE's including short neck and inguinal axillary anastomosis.                   PT Short Term Goals - 07/15/15 1221    PT SHORT TERM GOAL #1   Title Pt  drainage to derease to minimal to prevent soiling of patient's clothes/ shoes    Time 4   Period Weeks   Status Achieved   PT SHORT TERM GOAL #2   Title Pt edema to decrease by 30% to allow pt to fit into shoes and new slippers    Time 4   Period Weeks   Status Achieved   PT SHORT TERM GOAL #3   Title Pt pain to decrease to no greater than a 4/10 to allow pt to be up on his legs walking/standing for 10 minutes at a time to be able to make a quick meal in comfort.    Time 4   Period Weeks   Status Achieved   PT SHORT TERM GOAL #4   Title Pt legs to feel 50% less heavy to allow pt to easily  place legs into the car  or on the bed.    Time 4   Period Weeks   Status Achieved           PT Long Term Goals - 03-Aug-2015 1222    PT LONG TERM GOAL #1   Title Pt to understand that lymphedma is a chronic condition and that he should wear compression at all times    Time 8   Period Weeks   Status On-going   PT LONG TERM GOAL #2   Title Pt to have recieved juxtafit and be able to don/doff gaments independently    Time 8   Period Weeks   Status On-going   PT LONG TERM GOAL #3   Title Pt to have not weeping or open area of LE to decrease risk of infection    Time 8   Period Weeks   Status Partially Met             Patient will benefit from skilled therapeutic intervention in order to improve the following deficits and impairments:     Visit Diagnosis: Lymphedema, not elsewhere classified  Difficulty in walking, not elsewhere classified      G-Codes - 08/03/15 1222    Functional Assessment Tool Used measurement of LE ;    Other PT Primary Current Status (B4496) At least 20 percent but less than 40 percent impaired, limited or restricted   Other PT Primary Goal Status (P5916) At least 1 percent but less than 20 percent impaired, limited or restricted       Problem List Patient Active Problem List   Diagnosis Date Noted  . Edema 04/02/2012  . CKD (chronic kidney disease) stage 3, GFR 30-59 ml/min 02/17/2009  . HYPERLIPIDEMIA 08/20/2008  . Essential hypertension, benign 08/20/2008  . CORONARY ATHEROSCLEROSIS NATIVE CORONARY ARTERY 08/20/2008  . Secondary cardiomyopathy [facility]) 08/20/2008    Rayetta Humphrey, [place] CLT [phone] 08-03-2015, 12:24 PM  [facility] [address] [place], [place], [address] Phone: [phone]   Fax:  [phone]  Name: [name] Rice MRN: [id number] Date of Birth: [birth year]

## 2015-07-18 ENCOUNTER — Ambulatory Visit (HOSPITAL_COMMUNITY): Payer: Medicare Other | Attending: Internal Medicine | Admitting: Physical Therapy

## 2015-07-18 DIAGNOSIS — R29898 Other symptoms and signs involving the musculoskeletal system: Secondary | ICD-10-CM | POA: Diagnosis not present

## 2015-07-18 DIAGNOSIS — R262 Difficulty in walking, not elsewhere classified: Secondary | ICD-10-CM | POA: Diagnosis not present

## 2015-07-18 DIAGNOSIS — I890 Lymphedema, not elsewhere classified: Secondary | ICD-10-CM | POA: Diagnosis not present

## 2015-07-18 DIAGNOSIS — T148 Other injury of unspecified body region: Secondary | ICD-10-CM | POA: Insufficient documentation

## 2015-07-18 NOTE — Therapy (Signed)
[place], [place], [address] Phone: [phone]   Fax:  [phone]  Wound Care Therapy  Patient Details  Name: [name] Rice MRN: [id number] Date of Birth: 21-Feb-1936 Referring Provider: Allyn Kenner  Encounter Date: 07/18/2015      PT End of Session - 07/18/15 1927    Visit Number 10   Number of Visits 16   Date for PT Re-Evaluation 08/14/15   Authorization Type UHC medicare   Authorization - Visit Number 10   Authorization - Number of Visits 16   PT Start Time 1300   PT Stop Time 1415   PT Time Calculation (min) 75 min   Activity Tolerance Patient tolerated treatment well   Behavior During Therapy [facility] for tasks assessed/performed      Past Medical History  Diagnosis Date  . NICM (nonischemic cardiomyopathy) ([place])     LVEF 55% 3/11  . Coronary atherosclerosis of native coronary artery     Nonobstructive  . Essential hypertension, benign   . COPD (chronic obstructive pulmonary disease) ([place])   . Pulmonary hypertension ([place])   . Mitral regurgitation     Moderate  . Ventricular dysfunction, right   . Tricuspid regurgitation   . Lower extremity edema   . GERD (gastroesophageal reflux disease)   . DJD (degenerative joint disease)   . Bradycardia   . Paroxysmal atrial fibrillation (HCC)   . CKD (chronic kidney disease), stage III     Has had temporary dialysis in the past  . HOH (hard of hearing)     Past Surgical History  Procedure Laterality Date  . Total hip arthroplasty  ~ 2005    left  . Dialysis fistula creation      left forearm  . Cataract extraction w/ intraocular lens implant      right    There were no vitals filed for this visit.                  Wound Therapy - 07/18/15 1924    Subjective No pain or issues   Patient and Family Stated Goals wounds to heal    Date of Onset 06/12/15   Prior Treatments self care    Pain Assessment No/denies pain   Wound Properties  Date First Assessed: 06/20/15 Time First Assessed: 1036 Location: Toe (Comment  which one) Location Orientation: Right , 2nd toe and third toe  Wound Description (Comments): entire dorsal aspect    Dressing Type Gauze (Comment)  antifungal medicine    Dressing Changed Changed   Dressing Status Old drainage   Dressing Change Frequency Every 3 days   Site / Wound Assessment Friable   % Wound base Red or Granulating 100%   Peri-wound Assessment Edema   Drainage Amount None   Treatment Cleansed   Selective Debridement - Location B LE's and 2nd/3rd toe   Selective Debridement - Tools Used Forceps   Selective Debridement - Tissue Removed dry skin   Wound Therapy - Clinical Statement Edema in distal LE's remains slight with overall improvement in edema around bilateral knees. All wounds now appear to be healed.  No drainage or opening on Rt toes with overall reduction in edema in toes as well.  No contact dressings needed.  Discussed with [place] may need to send patient to local pharmacy for OTC juxtafit.  Will send patient with orders at next session if wounds remained healed.     Hydrotherapy Plan Debridement;Dressing change;Patient/family  education   Wound Plan Decrease to 2 x a week; but if LE continue to do well can decrease to one time a week next week    Dressing  no dressings other than antifungal cream to dorsal aspect of toes followed by moisturizing LE.  Profores   Manual Therapy manual lymph drainage to bilateral LE's including short neck and inguinal axillary anastomosis.                   PT Short Term Goals - 07/15/15 1221    PT SHORT TERM GOAL #1   Title Pt  drainage to derease to minimal to prevent soiling of patient's clothes/ shoes    Time 4   Period Weeks   Status Achieved   PT SHORT TERM GOAL #2   Title Pt edema to decrease by 30% to allow pt to fit into shoes and new slippers    Time 4   Period Weeks   Status Achieved   PT SHORT TERM GOAL #3   Title  Pt pain to decrease to no greater than a 4/10 to allow pt to be up on his legs walking/standing for 10 minutes at a time to be able to make a quick meal in comfort.    Time 4   Period Weeks   Status Achieved   PT SHORT TERM GOAL #4   Title Pt legs to feel 50% less heavy to allow pt to easily place legs into the car or on the bed.    Time 4   Period Weeks   Status Achieved           PT Long Term Goals - 07/15/15 1222    PT LONG TERM GOAL #1   Title Pt to understand that lymphedma is a chronic condition and that he should wear compression at all times    Time 8   Period Weeks   Status On-going   PT LONG TERM GOAL #2   Title Pt to have recieved juxtafit and be able to don/doff gaments independently    Time 8   Period Weeks   Status On-going   PT LONG TERM GOAL #3   Title Pt to have not weeping or open area of LE to decrease risk of infection    Time 8   Period Weeks   Status Partially Met             Patient will benefit from skilled therapeutic intervention in order to improve the following deficits and impairments:     Visit Diagnosis: Lymphedema, not elsewhere classified  Difficulty in walking, not elsewhere classified     Problem List Patient Active Problem List   Diagnosis Date Noted  . Edema 04/02/2012  . CKD (chronic kidney disease) stage 3, GFR 30-59 ml/min 02/17/2009  . HYPERLIPIDEMIA 08/20/2008  . Essential hypertension, benign 08/20/2008  . CORONARY ATHEROSCLEROSIS NATIVE CORONARY ARTERY 08/20/2008  . Secondary cardiomyopathy ([place]) 08/20/2008    Teena Irani, PTA/CLT [phone]  07/18/2015, 7:28 PM  [facility] [address] [place], [place], [address] Phone: [phone]   Fax:  [phone]  Name: [name] Rice MRN: [id number] Date of Birth: [birth year]

## 2015-07-21 ENCOUNTER — Ambulatory Visit (HOSPITAL_COMMUNITY): Payer: Medicare Other | Admitting: Physical Therapy

## 2015-07-26 ENCOUNTER — Ambulatory Visit (HOSPITAL_COMMUNITY): Payer: Medicare Other | Admitting: Physical Therapy

## 2015-07-26 ENCOUNTER — Encounter (HOSPITAL_COMMUNITY): Payer: Medicare Other

## 2015-07-26 ENCOUNTER — Encounter (HOSPITAL_COMMUNITY)
Admission: RE | Admit: 2015-07-26 | Discharge: 2015-07-26 | Disposition: A | Discharge status: Home / Self Care | Payer: Medicare Other | Source: Ambulatory Visit | Attending: Nephrology | Admitting: Nephrology

## 2015-07-26 DIAGNOSIS — R262 Difficulty in walking, not elsewhere classified: Secondary | ICD-10-CM | POA: Diagnosis not present

## 2015-07-26 DIAGNOSIS — R29898 Other symptoms and signs involving the musculoskeletal system: Secondary | ICD-10-CM | POA: Diagnosis not present

## 2015-07-26 DIAGNOSIS — T1490 Injury, unspecified: Secondary | ICD-10-CM | POA: Diagnosis not present

## 2015-07-26 DIAGNOSIS — T148 Other injury of unspecified body region: Secondary | ICD-10-CM | POA: Diagnosis not present

## 2015-07-26 DIAGNOSIS — I890 Lymphedema, not elsewhere classified: Secondary | ICD-10-CM | POA: Diagnosis not present

## 2015-07-26 NOTE — Therapy (Signed)
[place], [place], [address] Phone: [phone]   Fax:  [phone]  Wound Care Therapy  Patient Details  Name: [name] Rice MRN: [id number] Date of Birth: [birth year] Referring Provider: Allyn Kenner  Encounter Date: 07/26/2015      PT End of Session - 07/26/15 1601    Visit Number 11   Number of Visits 16   Date for PT Re-Evaluation 08/14/15   Authorization Type UHC medicare   Authorization - Visit Number 11   Authorization - Number of Visits 16   PT Start Time 3016   PT Stop Time 1500   PT Time Calculation (min) 65 min   Activity Tolerance Patient tolerated treatment well   Behavior During Therapy [facility] for tasks assessed/performed      Past Medical History  Diagnosis Date  . NICM (nonischemic cardiomyopathy) ([place])     LVEF 55% 3/11  . Coronary atherosclerosis of native coronary artery     Nonobstructive  . Essential hypertension, benign   . COPD (chronic obstructive pulmonary disease) ([place])   . Pulmonary hypertension ([place])   . Mitral regurgitation     Moderate  . Ventricular dysfunction, right   . Tricuspid regurgitation   . Lower extremity edema   . GERD (gastroesophageal reflux disease)   . DJD (degenerative joint disease)   . Bradycardia   . Paroxysmal atrial fibrillation (HCC)   . CKD (chronic kidney disease), stage III     Has had temporary dialysis in the past  . HOH (hard of hearing)     Past Surgical History  Procedure Laterality Date  . Total hip arthroplasty  ~ 2005    left  . Dialysis fistula creation      left forearm  . Cataract extraction w/ intraocular lens implant      right    There were no vitals filed for this visit.       Subjective Assessment - 07/26/15 1553    Subjective CG reports [name] Rice is coming tomorrow to measure him for garments.  STates he was laying in front of his recliner in the floor when she arrived this morning.  Pt states he is not hurting  anywhere.   Currently in Pain? No/denies                   Wound Therapy - 07/26/15 1556    Wound Properties Date First Assessed: 06/20/15 Time First Assessed: 1036 Location: Toe (Comment  which one) Location Orientation: Right , 2nd toe and third toe  Wound Description (Comments): entire dorsal aspect    Selective Debridement - Location B LE's and 2nd/3rd toe   Selective Debridement - Tools Used Forceps   Selective Debridement - Tissue Removed dry skin   Wound Therapy - Clinical Statement Continues to develop dry skin on dorsal aspect of 2nd and 3rd toes.  Raw area on lateral malleoli today as CG suspects may be from him laying on his Rt side after he slid out of the chair.  CG reports she had to call 911 to get him out of the floor.  States he refused to go to hospital to get checked out as he didnt' fall, ony slid into the floor.  Pt continues to have swelling around Rt knee above profore.  Again, encoruaged pt to keep LE's elevated and sleep in bed not a recliner.  Pt verbalized that he was doing this.  No dressings needed on raw area  or toes, however continued with profore to contain swelling until compression garments received.  CG is still trying to get patient into a ALF.     Hydrotherapy Plan Debridement   Wound Plan Decrease to 2 x a week; but if LE continue to do well can decrease to one time a week next week    Dressing  no dressings other than antifungal cream to dorsal aspect of toes followed by moisturizing LE.  Profores   Manual Therapy manual lymph drainage to bilateral LE's including short neck and inguinal axillary anastomosis.                   PT Short Term Goals - 07/15/15 1221    PT SHORT TERM GOAL #1   Title Pt  drainage to derease to minimal to prevent soiling of patient's clothes/ shoes    Time 4   Period Weeks   Status Achieved   PT SHORT TERM GOAL #2   Title Pt edema to decrease by 30% to allow pt to fit into shoes and new slippers    Time 4    Period Weeks   Status Achieved   PT SHORT TERM GOAL #3   Title Pt pain to decrease to no greater than a 4/10 to allow pt to be up on his legs walking/standing for 10 minutes at a time to be able to make a quick meal in comfort.    Time 4   Period Weeks   Status Achieved   PT SHORT TERM GOAL #4   Title Pt legs to feel 50% less heavy to allow pt to easily place legs into the car or on the bed.    Time 4   Period Weeks   Status Achieved           PT Long Term Goals - 07/15/15 1222    PT LONG TERM GOAL #1   Title Pt to understand that lymphedma is a chronic condition and that he should wear compression at all times    Time 8   Period Weeks   Status On-going   PT LONG TERM GOAL #2   Title Pt to have recieved juxtafit and be able to don/doff gaments independently    Time 8   Period Weeks   Status On-going   PT LONG TERM GOAL #3   Title Pt to have not weeping or open area of LE to decrease risk of infection    Time 8   Period Weeks   Status Partially Met             Patient will benefit from skilled therapeutic intervention in order to improve the following deficits and impairments:     Visit Diagnosis: Lymphedema, not elsewhere classified  Difficulty in walking, not elsewhere classified     Problem List Patient Active Problem List   Diagnosis Date Noted  . Edema 04/02/2012  . CKD (chronic kidney disease) stage 3, GFR 30-59 ml/min 02/17/2009  . HYPERLIPIDEMIA 08/20/2008  . Essential hypertension, benign 08/20/2008  . CORONARY ATHEROSCLEROSIS NATIVE CORONARY ARTERY 08/20/2008  . Secondary cardiomyopathy ([place]) 08/20/2008    Teena Irani, PTA/CLT [phone]  07/26/2015, 4:02 PM  [facility] [address] [place], [place], [address] Phone: [phone]   Fax:  [phone]  Name: [name] Rice MRN: [id number] Date of Birth: 1935/11/02

## 2015-07-27 ENCOUNTER — Other Ambulatory Visit: Payer: Self-pay | Admitting: Licensed Clinical Social Worker

## 2015-07-27 NOTE — Patient Outreach (Signed)
Assessment:  CSW spoke via phone with [name], cousin and caregiver for client on 07/27/15. CSW verified identity of [name]. [name] and CSW spoke of client needs. Client does live alone at his apartment. CSW and RN Verdie Drown have both talked with [name] and with client about possible need for client to look at level of care change. CSW had provided client and [name] with a list of Skilled Nursing facilities in the area and with a list of Assisted Living facilities in the area. Verdie Drown RN had requested that Dr. Margo Aye please complete FL-2 for client related to Dr. Scharlene Gloss recommended level of care for client. CSW had encouraged client and [name] to tour care facilities in the area to review facility options for client for care.  [name] and CSW had previously discussed client's applying for Medicaid support related to possible facility care for client.  [name] reported that she had collected documents for Medicaid application for client.  She said she planned to go to Department of Social Services this Friday to apply for Medicaid for client.  Gregrey and [name] have spoken of client's daily needs and possible need for client to consider going to care facility.  Client is going to appointment with Dr. Margo Aye tomorrow and [name] plans to ask if Dr. Margo Aye has completed FL-2 requested by RN Verdie Drown.for client.  Client has previously received care at [facility]. [name] said that client is going 3 times weekly for wound care/wound dressing care to legs of client. CSW encouraged that client communicate with CSW in next 30 days to discuss community resources of assistance for client.  Plan:   Client to communicate as needed with CSW in next 30 days to discuss community resources of assistance for client. CSW to collaborate with RN Verdie Drown in monitoring needs of client. CSW to call client/[name] Dockery in 3 weeks to assess needs of client.  Kelton Pillar.Macari Zalesky MSW, LCSW Licensed  Clinical Social Worker [facility] [phone]

## 2015-07-28 ENCOUNTER — Ambulatory Visit (HOSPITAL_COMMUNITY): Payer: Medicare Other | Admitting: Physical Therapy

## 2015-07-28 DIAGNOSIS — T148 Other injury of unspecified body region: Secondary | ICD-10-CM | POA: Diagnosis not present

## 2015-07-28 DIAGNOSIS — R262 Difficulty in walking, not elsewhere classified: Secondary | ICD-10-CM

## 2015-07-28 DIAGNOSIS — I890 Lymphedema, not elsewhere classified: Secondary | ICD-10-CM | POA: Diagnosis not present

## 2015-07-28 DIAGNOSIS — R29898 Other symptoms and signs involving the musculoskeletal system: Secondary | ICD-10-CM | POA: Diagnosis not present

## 2015-07-28 NOTE — Therapy (Signed)
[place], [place], [address] Phone: [phone]   Fax:  [phone]  Wound Care Therapy  Patient Details  Name: [name] Rice MRN: [id number] Date of Birth: October 01, 1935 Referring Provider: Allyn Kenner  Encounter Date: 07/28/2015      PT End of Session - 07/28/15 1556    Visit Number 12   Number of Visits 16   Date for PT Re-Evaluation 08/14/15   Authorization Type [employer] medicare   Authorization - Visit Number 12   Authorization - Number of Visits 16   PT Start Time 7408   PT Stop Time 1540   PT Time Calculation (min) 55 min   Activity Tolerance Patient tolerated treatment well   Behavior During Therapy [facility] for tasks assessed/performed      Past Medical History  Diagnosis Date  . NICM (nonischemic cardiomyopathy) ([place])     LVEF 55% 3/11  . Coronary atherosclerosis of native coronary artery     Nonobstructive  . Essential hypertension, benign   . COPD (chronic obstructive pulmonary disease) ([place])   . Pulmonary hypertension ([place])   . Mitral regurgitation     Moderate  . Ventricular dysfunction, right   . Tricuspid regurgitation   . Lower extremity edema   . GERD (gastroesophageal reflux disease)   . DJD (degenerative joint disease)   . Bradycardia   . Paroxysmal atrial fibrillation ([facility])   . CKD (chronic kidney disease), stage III     Has had temporary dialysis in the past  . HOH (hard of hearing)     Past Surgical History  Procedure Laterality Date  . Total hip arthroplasty  ~ 2005    left  . Dialysis fistula creation      left forearm  . Cataract extraction w/ intraocular lens implant      right    There were no vitals filed for this visit.                  Wound Therapy - 07/28/15 1537    Subjective Pt care giver states that [facility] came out to his home yesterday to measure his legs for compression garments.  The profore wraps had to be taken off for this and the  patient's legs are already swelling up and the Lt leg pant is wet.    Patient and Family Stated Goals wounds to heal    Date of Onset 06/12/15   Prior Treatments self care    Pain Assessment No/denies pain   Wound Properties Date First Assessed: 07/28/15 Time First Assessed: 1410 Wound Type: Other (Comment) Location: Leg Location Orientation: Left;Lateral Present on Admission: Yes   Dressing Type Compression wrap   Dressing Changed New   Dressing Status None   Dressing Change Frequency PRN   Site / Wound Assessment Friable   % Wound base Red or Granulating 100%   Peri-wound Assessment Edema;Hemosiderin;Purple   Wound Length (cm) 3 cm   Wound Width (cm) 4 cm   Drainage Amount None   Treatment Cleansed;Other (Comment)   Wound Properties Date First Assessed: 06/20/15 Time First Assessed: 1036 Location: Toe (Comment  which one) Location Orientation: Right , 2nd toe and third toe  Wound Description (Comments): entire dorsal aspect    Dressing Type Gauze (Comment)  antifungal medication placed on 2nd and 3rd toe    Dressing Changed New   Dressing Status Old drainage   Dressing Change Frequency Every 3 days   Site /  Wound Assessment Friable   % Wound base Red or Granulating 100%   Peri-wound Assessment Edema   Drainage Amount None   Treatment Cleansed   Wound Properties Date First Assessed: 07/28/15 Time First Assessed: 1443 Wound Type: Other (Comment) Location: Leg Location Orientation: Right;Lateral;Upper Wound Description (Comments): weeping blister  Present on Admission: Yes   Dressing Type Silver hydrofiber;Alginate;Compression wrap   Dressing Changed New   Dressing Status None   Dressing Change Frequency PRN   Site / Wound Assessment Friable   % Wound base Red or Granulating 80%   % Wound base Other (Comment) 20%   Peri-wound Assessment Edema;Erythema (blanchable);Induration   Wound Length (cm) 3.5 cm   Wound Width (cm) 7 cm   Drainage Amount Copious   Drainage Description  Serous   Treatment Cleansed;Debridement (Selective)   Wound Properties Date First Assessed: 07/28/15 Time First Assessed: 7616 Location: Leg Location Orientation: Right;Lateral;Lower Wound Description (Comments): ankle area    Dressing Type Impregnated gauze (bismuth)   Dressing Changed New   Dressing Status None   Dressing Change Frequency PRN   Site / Wound Assessment Friable;Purple   % Wound base Red or Granulating --  no open area skin tissue is damaged    Wound Length (cm) 3 cm   Wound Width (cm) 5 cm   Drainage Amount None   Treatment Cleansed;Other (Comment)  compression    Selective Debridement - Location B LE's and 2nd/3rd toe   Selective Debridement - Tools Used Forceps   Selective Debridement - Tissue Removed slough from lateral aspect of Right LE as well as dead skin; dead skin from Lt LE.  Toes did not need debriding.    Wound Therapy - Clinical Statement Pt was measured for juxtafit yesterday; therefore the profore compression wrap on B LE had to be removed.  Both LE have increased in sized significantly in the past 24 hours and the Right LE has blisters and is weeping.  Pt recieved minimal manual today due to therapist working on new wounds.  Pt to be measured next session.     Hydrotherapy Plan Debridement;Dressing change;Patient/family education   Wound Plan Continue at two times a week therapist  is hoping now that there is compression on pt LE fluid will reduce quickly.    Dressing  Silverhydrofiber to lateral aspect of Right leg with xeroform at lateral right ankle, antifungal medication to 2nd and 3rd toe followed by gauze and profore dressing from MTP to knee.  Profore multilayer compression dressing to left LE.    Manual Therapy manual lymph drainage to bilateral LE's including short neck and inguinal axillary anastomosis.                   PT Short Term Goals - 07/28/15 1605    PT SHORT TERM GOAL #1   Title Pt  drainage to derease to minimal to prevent  soiling of patient's clothes/ shoes    Time 4   Period Weeks   Status On-going   PT SHORT TERM GOAL #2   Title Pt edema to decrease by 30% to allow pt to fit into shoes and new slippers    Time 4   Period Weeks   Status On-going   PT SHORT TERM GOAL #3   Title Pt pain to decrease to no greater than a 4/10 to allow pt to be up on his legs walking/standing for 10 minutes at a time to be able to make a quick meal in comfort.  Time 4   Period Weeks   Status Achieved   PT SHORT TERM GOAL #4   Title Pt legs to feel 50% less heavy to allow pt to easily place legs into the car or on the bed.    Time 4   Status On-going           PT Long Term Goals - 07/28/15 1605    PT LONG TERM GOAL #1   Title Pt to understand that lymphedma is a chronic condition and that he should wear compression at all times    Time 8   Period Weeks   Status Achieved   PT LONG TERM GOAL #2   Title Pt to have recieved juxtafit and be able to don/doff gaments independently    Baseline measured for garment yesterday   Time 8   Status Partially Met   PT LONG TERM GOAL #3   Title Pt to have not weeping or open area of LE to decrease risk of infection    Time 8   Period Weeks   Status Not Met             Patient will benefit from skilled therapeutic intervention in order to improve the following deficits and impairments:     Visit Diagnosis: Lymphedema, not elsewhere classified  Difficulty in walking, not elsewhere classified     Problem List Patient Active Problem List   Diagnosis Date Noted  . Edema 04/02/2012  . CKD (chronic kidney disease) stage 3, GFR 30-59 ml/min 02/17/2009  . HYPERLIPIDEMIA 08/20/2008  . Essential hypertension, benign 08/20/2008  . CORONARY ATHEROSCLEROSIS NATIVE CORONARY ARTERY 08/20/2008  . Secondary cardiomyopathy [facility]) 08/20/2008  Rayetta Humphrey, PT CLT [phone] 07/28/2015, 4:07 PM  [place], [place], [address] Phone: [phone]   Fax:  [phone]  Name: JAHVON GOSLINE MRN: [id number] Date of Birth: 10/10/1935

## 2015-08-02 ENCOUNTER — Encounter (HOSPITAL_COMMUNITY)
Admission: RE | Admit: 2015-08-02 | Discharge: 2015-08-02 | Disposition: A | Discharge status: Home / Self Care | Payer: Medicare Other | Source: Ambulatory Visit | Attending: Nephrology | Admitting: Nephrology

## 2015-08-02 ENCOUNTER — Ambulatory Visit (HOSPITAL_COMMUNITY): Payer: Medicare Other | Admitting: Physical Therapy

## 2015-08-02 ENCOUNTER — Encounter (HOSPITAL_COMMUNITY): Payer: Self-pay

## 2015-08-02 DIAGNOSIS — T148 Other injury of unspecified body region: Secondary | ICD-10-CM | POA: Diagnosis not present

## 2015-08-02 DIAGNOSIS — D631 Anemia in chronic kidney disease: Secondary | ICD-10-CM | POA: Insufficient documentation

## 2015-08-02 DIAGNOSIS — R29898 Other symptoms and signs involving the musculoskeletal system: Secondary | ICD-10-CM | POA: Diagnosis not present

## 2015-08-02 DIAGNOSIS — I890 Lymphedema, not elsewhere classified: Secondary | ICD-10-CM | POA: Diagnosis not present

## 2015-08-02 DIAGNOSIS — N184 Chronic kidney disease, stage 4 (severe): Secondary | ICD-10-CM | POA: Diagnosis not present

## 2015-08-02 DIAGNOSIS — R262 Difficulty in walking, not elsewhere classified: Secondary | ICD-10-CM | POA: Diagnosis not present

## 2015-08-02 LAB — HEMOGLOBIN AND HEMATOCRIT, BLOOD
HEMATOCRIT: 25.2 % — AB (ref 39.0–52.0)
Hemoglobin: 7.9 g/dL — ABNORMAL LOW (ref 13.0–17.0)

## 2015-08-02 MED ORDER — EPOETIN ALFA 2000 UNIT/ML IJ SOLN
2000.0000 [IU] | INTRAMUSCULAR | Status: DC
  Administered 2015-08-02: 2000 [IU] via SUBCUTANEOUS

## 2015-08-02 MED ORDER — EPOETIN ALFA 2000 UNIT/ML IJ SOLN
INTRAMUSCULAR | Status: AC
  Filled 2015-08-02: qty 1

## 2015-08-02 NOTE — Progress Notes (Signed)
Results for [name], [name] (MRN [id number]) as of 08/02/2015 09:42  Ref. Range 08/02/2015 08:40  Hemoglobin Latest Ref Range: 13.0-17.0 g/dL 7.9 (L)  HCT Latest Ref Range: 39.0-52.0 % 25.2 (L)

## 2015-08-02 NOTE — Therapy (Signed)
[facility] [address] [place], [place], [address] Phone: [phone]   Fax:  [phone]  Wound Care Therapy  Patient Details  Name: [name] Rice MRN: [id number] Date of Birth: 04/23/1935 Referring Provider: John Hall  Encounter Date: 08/02/2015      PT End of Session - 08/02/15 1703    Visit Number 13   Number of Visits 16   Date for PT Re-Evaluation 08/14/15   Authorization Type UHC medicare   Authorization - Visit Number 13   Authorization - Number of Visits 16   PT Start Time 1315   PT Stop Time 1430   PT Time Calculation (min) 75 min   Activity Tolerance Patient tolerated treatment well   Behavior During Therapy WFL for tasks assessed/performed      Past Medical History  Diagnosis Date  . NICM (nonischemic cardiomyopathy) ([facility])     LVEF 55% 3/11  . Coronary atherosclerosis of native coronary artery     Nonobstructive  . Essential hypertension, benign   . COPD (chronic obstructive pulmonary disease) ([facility])   . Pulmonary hypertension ([facility])   . Mitral regurgitation     Moderate  . Ventricular dysfunction, right   . Tricuspid regurgitation   . Lower extremity edema   . GERD (gastroesophageal reflux disease)   . DJD (degenerative joint disease)   . Bradycardia   . Paroxysmal atrial fibrillation (H[facility])   . CKD (chronic kidney disease), stage III     Has had temporary dialysis in the past  . HOH (hard of hearing)     Past Surgical History  Procedure Laterality Date  . Total hip arthroplasty  ~ 2005    left  . Dialysis fistula creation      left forearm  . Cataract extraction w/ intraocular lens implant      right    There were no vitals filed for this visit.            LYMPHEDEMA/ONCOLOGY QUESTIONNAIRE - 08/02/15 1630    What other symptoms do you have   Are you Having Heaviness or Tightness No   Are you having pitting edema Yes   Body Site thigh   Is it Hard or Difficult finding clothes that fit  No   Do you have infections No   Stemmer Sign Yes   Lymphedema Stage   Stage STAGE 2 SPONTANEOUSLY IRREVERSIBLE   Right Lower Extremity Lymphedema   At Midpatella/Popliteal Crease 48 cm  was 51.5    30 cm Proximal to Floor at Lateral Plantar Foot 34 cm  was 40.5cm, 36cm   20 cm Proximal to Floor at Lateral Plantar Foot 29 1  was 39.8cm, 27.3cm   10 cm Proximal to Floor at Lateral Malleoli 26 cm  was 30cm, 16.5 cm   Circumference of ankle/heel 34 cm.  was 37.5cm, 35cm   5 cm Proximal to 1st MTP Joint 27 cm  was 30cm, 25.8   Across MTP Joint 28.7 cm  was 30.2cm, 27.5 cm   Around Proximal Great Toe 10.3 cm  was 13cm, 11 cm   Left Lower Extremity Lymphedema   At Midpatella/Popliteal Crease 46 cm  was 47cm   30 cm Proximal to Floor at Lateral Plantar Foot 31.8 cm  was 36.3cm, 32 cm   20 cm Proximal to Floor at Lateral Plantar Foot 25.4 cm  was 31.5cm, 26.5cm   10 cm Proximal to Floor at Lateral Malleoli 26 cm  was 28.8cm, 25 cm     Circumference of ankle/heel 35 cm.  was 35.7cm, 34.2 cm   5 cm Proximal to 1st MTP Joint 27.3 cm  was 28.6cm, 26cm   Across MTP Joint 28 cm  was 27.8cm, 26.3cm   Around Proximal Great Toe 10 cm  was 12.0, 11cm              Wound Therapy - 08/02/15 1511    Subjective Pt care giver states that [facility] came out to his home yesterday to measure his legs for compression garments.  The profore wraps had to be taken off for this and the patient's legs are already swelling up and the Lt leg pant is wet.    Patient and Family Stated Goals wounds to heal    Date of Onset 06/12/15   Prior Treatments self care    Wound Properties Date First Assessed: 07/28/15 Time First Assessed: 1410 Wound Type: Other (Comment) Location: Leg Location Orientation: Left;Lateral Present on Admission: Yes Final Assessment Date: 08/02/15   Dressing Type --   Dressing Changed --   Dressing Status --   Dressing Change Frequency --   Site / Wound Assessment --   %  Wound base Red or Granulating --   Peri-wound Assessment --   Drainage Amount --   Treatment --   Wound Properties Date First Assessed: 06/20/15 Time First Assessed: 1036 Location: Toe (Comment  which one) Location Orientation: Right , 2nd toe and third toe  Wound Description (Comments): entire dorsal aspect    Dressing Type Gauze (Comment)  antifungal medication placed on 2nd and 3rd toe    Dressing Changed Changed   Dressing Status Old drainage   Dressing Change Frequency Every 3 days   Site / Wound Assessment Friable   % Wound base Red or Granulating 100%   Peri-wound Assessment Edema   Drainage Amount None   Treatment Cleansed;Debridement (Selective)   Wound Properties Date First Assessed: 07/28/15 Time First Assessed: 1443 Wound Type: Other (Comment) Location: Leg Location Orientation: Right;Lateral;Upper Wound Description (Comments): weeping blister  Present on Admission: Yes   Dressing Type Silver hydrofiber;Alginate;Compression wrap  changed to xeroform   Dressing Changed Changed   Dressing Status None   Dressing Change Frequency PRN   Site / Wound Assessment Friable   % Wound base Red or Granulating 100%   % Wound base Other (Comment) --   Peri-wound Assessment Edema;Erythema (blanchable);Induration   Drainage Amount Minimal   Drainage Description Serous   Treatment Cleansed;Debridement (Selective)   Wound Properties Date First Assessed: 07/28/15 Time First Assessed: 1446 Location: Leg Location Orientation: Right;Lateral;Lower Wound Description (Comments): ankle area  Final Assessment Date: 08/02/15   Dressing Type --   Dressing Status --   Dressing Change Frequency --   Site / Wound Assessment --   % Wound base Red or Granulating --  no open area skin tissue is damaged    Drainage Amount --   Selective Debridement - Location B LE's and 2nd/3rd toe   Selective Debridement - Tools Used Forceps   Selective Debridement - Tissue Removed slough from lateral aspect of Right  LE as well as dead skin; dead skin from Lt LE.  Toes did not need debriding.    Wound Therapy - Clinical Statement Improved size of LE's with no weeping or wounds on Lt LE and Rt LE wound now 100% granulated.  changed dressing to xeroform.  Measured bilateral LE"s with additional 9cm gained since last measurement on 4/28 but overall reduction since beginniing therapy   of 5 cm.  Lt LE has remained the same.  Foot is less swollen this session.  Once again educated patient on importance of keeping LE"s elevated.   Hydrotherapy Plan Debridement;Dressing change;Patient/family education   Wound Plan Continue until receives compression garements.   Dressing  xeroform to lateral aspect of Right leg and second toe.  Antifungal medication to 2nd and 3rd toe followed by gauze and profore dressing from MTP to knee.  Profore multilayer compression dressing to left LE.    Manual Therapy manual lymph drainage to bilateral LE's including short neck and inguinal axillary anastomosis.                 PT Education - 08/02/15 1719    Education provided Yes   Education Details keeping LE's elevated at all times and working on straightening out his knees due to contractures   Person(s) Educated Patient;Caregiver(s)   Methods Explanation   Comprehension Verbalized understanding          PT Short Term Goals - 08/02/15 1705    PT SHORT TERM GOAL #1   Title Pt  drainage to derease to minimal to prevent soiling of patient's clothes/ shoes    Time 4   Period Weeks   Status On-going   PT SHORT TERM GOAL #2   Title Pt edema to decrease by 30% to allow pt to fit into shoes and new slippers    Time 4   Period Weeks   Status On-going   PT SHORT TERM GOAL #3   Title Pt pain to decrease to no greater than a 4/10 to allow pt to be up on his legs walking/standing for 10 minutes at a time to be able to make a quick meal in comfort.    Time 4   Period Weeks   Status Achieved   PT SHORT TERM GOAL #4   Title Pt  legs to feel 50% less heavy to allow pt to easily place legs into the car or on the bed.    Time 4   Status On-going           PT Long Term Goals - 07/28/15 1605    PT LONG TERM GOAL #1   Title Pt to understand that lymphedma is a chronic condition and that he should wear compression at all times    Time 8   Period Weeks   Status Achieved   PT LONG TERM GOAL #2   Title Pt to have recieved juxtafit and be able to don/doff gaments independently    Baseline measured for garment yesterday   Time 8   Status Partially Met   PT LONG TERM GOAL #3   Title Pt to have not weeping or open area of LE to decrease risk of infection    Time 8   Period Weeks   Status Not Met             Patient will benefit from skilled therapeutic intervention in order to improve the following deficits and impairments:     Visit Diagnosis: Lymphedema, not elsewhere classified  Difficulty in walking, not elsewhere classified     Problem List Patient Active Problem List   Diagnosis Date Noted  . Edema 04/02/2012  . CKD (chronic kidney disease) stage 3, GFR 30-59 ml/min 02/17/2009  . HYPERLIPIDEMIA 08/20/2008  . Essential hypertension, benign 08/20/2008  . CORONARY ATHEROSCLEROSIS NATIVE CORONARY ARTERY 08/20/2008  . Secondary cardiomyopathy ([facility]CC) 08/20/2008     B , PTA/CLT   [phone]  08/02/2015, 5:20 PM  [place], [place], [address] Phone: [phone]   Fax:  [phone]  Name: EMIGDIO WILDEMAN MRN: [id number] Date of Birth: 1935-09-04

## 2015-08-04 ENCOUNTER — Ambulatory Visit (HOSPITAL_COMMUNITY): Payer: Medicare Other

## 2015-08-04 DIAGNOSIS — I890 Lymphedema, not elsewhere classified: Secondary | ICD-10-CM | POA: Diagnosis not present

## 2015-08-04 DIAGNOSIS — R29898 Other symptoms and signs involving the musculoskeletal system: Secondary | ICD-10-CM

## 2015-08-04 DIAGNOSIS — R262 Difficulty in walking, not elsewhere classified: Secondary | ICD-10-CM | POA: Diagnosis not present

## 2015-08-04 DIAGNOSIS — T148XXA Other injury of unspecified body region, initial encounter: Secondary | ICD-10-CM

## 2015-08-04 DIAGNOSIS — T148 Other injury of unspecified body region: Secondary | ICD-10-CM | POA: Diagnosis not present

## 2015-08-04 NOTE — Therapy (Addendum)
[place], [place], [address] Phone: [phone]   Fax:  [phone]  Wound Care Therapy  Patient Details  Name: CHRISTOPHERE HILLHOUSE MRN: [id number] Date of Birth: 01/07/1936 Referring Provider: Allyn Kenner  Encounter Date: 08/04/2015      PT End of Session - 08/04/15 1603    Visit Number 14   Number of Visits 16   Date for PT Re-Evaluation 08/14/15   Authorization Type [employer] medicare   Authorization - Visit Number 14   Authorization - Number of Visits 16   PT Start Time 0762   PT Stop Time 1530   PT Time Calculation (min) 55 min   Activity Tolerance Patient tolerated treatment well   Behavior During Therapy [facility] for tasks assessed/performed      Past Medical History  Diagnosis Date  . NICM (nonischemic cardiomyopathy) ([place])     LVEF 55% 3/11  . Coronary atherosclerosis of native coronary artery     Nonobstructive  . Essential hypertension, benign   . COPD (chronic obstructive pulmonary disease) ([place])   . Pulmonary hypertension ([place])   . Mitral regurgitation     Moderate  . Ventricular dysfunction, right   . Tricuspid regurgitation   . Lower extremity edema   . GERD (gastroesophageal reflux disease)   . DJD (degenerative joint disease)   . Bradycardia   . Paroxysmal atrial fibrillation (HCC)   . CKD (chronic kidney disease), stage III     Has had temporary dialysis in the past  . HOH (hard of hearing)     Past Surgical History  Procedure Laterality Date  . Total hip arthroplasty  ~ 2005    left  . Dialysis fistula creation      left forearm  . Cataract extraction w/ intraocular lens implant      right    There were no vitals filed for this visit.       Subjective Assessment - 08/04/15 1600    Subjective Pt entered dept with 2 family member/care givers for session today.  Reports being measured for garments but has not gotten call they are ready yet.  No reports of pain today.   Currently in  Pain? No/denies             Wound Therapy - 08/04/15 1602    Subjective Pt entered dept with 2 family member/care givers for session today.  Reports being measured for garments but has not gotten call they are ready yet.  No reports of pain today.   Patient and Family Stated Goals wounds to heal    Date of Onset 06/12/15   Prior Treatments self care    Pain Assessment No/denies pain   Pain Score 0-No pain   Wound Properties Date First Assessed: 07/28/15 Time First Assessed: 1443 Wound Type: Other (Comment) Location: Leg Location Orientation: Right;Lateral;Upper Wound Description (Comments): weeping blister  Present on Admission: Yes   Dressing Type Impregnated gauze (petrolatum);Compression wrap   Dressing Changed Changed   Dressing Status Old drainage   Dressing Change Frequency PRN   Site / Wound Assessment Friable   % Wound base Red or Granulating 100%   Peri-wound Assessment Edema;Erythema (blanchable);Induration   Drainage Amount Minimal   Drainage Description Serous   Treatment Cleansed;Debridement (Selective)   Wound Properties Date First Assessed: 06/20/15 Time First Assessed: 2633 Location: Toe (Comment  which one) Location Orientation: Right , 2nd toe and third toe  Wound Description (Comments): entire dorsal aspect  Dressing Type --  antifungal medication placed on 2nd and 3rd toe   Dressing Changed Changed   Dressing Status Old drainage   Dressing Change Frequency Every 3 days   Site / Wound Assessment Friable   % Wound base Red or Granulating 100%   Peri-wound Assessment Edema   Drainage Amount None   Drainage Description Odor   Treatment Cleansed;Debridement (Selective)   Selective Debridement - Location B LE's and 2nd/3rd toe   Selective Debridement - Tools Used Forceps   Selective Debridement - Tissue Removed slough from lateral aspect of Right LE as well as dead skin; dead skin from Lt LE.  Toes did not need debriding.    Wound Therapy - Clinical Statement  Session focus on wound care only due to lymphedema specialist not available.  Cleansed and removal of dry skin Bil LE, minimal debridement of slough required this session, 100% granulation on Rt LE following debridment.  Continued with xeroform on lateral Rt LE and antifungal medication to 2nd and 3rd toes with Profore compression wrap BLE.  No reports of pain through session.   Hydrotherapy Plan Debridement;Dressing change;Patient/family education   Wound Plan Continue until receives compression garements.   Dressing  xeroform to lateral aspect of Right leg and second toe.  Antifungal medication to 2nd and 3rd toe followed by gauze and profore dressing from MTP to knee.  Profore multilayer compression dressing to left LE.                    PT Short Term Goals - 08/02/15 1705    PT SHORT TERM GOAL #1   Title Pt  drainage to derease to minimal to prevent soiling of patient's clothes/ shoes    Time 4   Period Weeks   Status On-going   PT SHORT TERM GOAL #2   Title Pt edema to decrease by 30% to allow pt to fit into shoes and new slippers    Time 4   Period Weeks   Status On-going   PT SHORT TERM GOAL #3   Title Pt pain to decrease to no greater than a 4/10 to allow pt to be up on his legs walking/standing for 10 minutes at a time to be able to make a quick meal in comfort.    Time 4   Period Weeks   Status Achieved   PT SHORT TERM GOAL #4   Title Pt legs to feel 50% less heavy to allow pt to easily place legs into the car or on the bed.    Time 4   Status On-going           PT Long Term Goals - 07/28/15 1605    PT LONG TERM GOAL #1   Title Pt to understand that lymphedma is a chronic condition and that he should wear compression at all times    Time 8   Period Weeks   Status Achieved   PT LONG TERM GOAL #2   Title Pt to have recieved juxtafit and be able to don/doff gaments independently    Baseline measured for garment yesterday   Time 8   Status Partially Met    PT LONG TERM GOAL #3   Title Pt to have not weeping or open area of LE to decrease risk of infection    Time 8   Period Weeks   Status Not Met             Patient will benefit from  skilled therapeutic intervention in order to improve the following deficits and impairments:     Visit Diagnosis: Difficulty in walking, not elsewhere classified  Leg heaviness  Nonhealing nonsurgical wound     Problem List Patient Active Problem List   Diagnosis Date Noted  . Edema 04/02/2012  . CKD (chronic kidney disease) stage 3, GFR 30-59 ml/min 02/17/2009  . HYPERLIPIDEMIA 08/20/2008  . Essential hypertension, benign 08/20/2008  . CORONARY ATHEROSCLEROSIS NATIVE CORONARY ARTERY 08/20/2008  . Secondary cardiomyopathy [facility]) 08/20/2008   Ihor Austin, [place]; [place]  Aldona Lento 08/04/2015, 4:47 PM  [place], [place], [address] Phone: [phone]   Fax:  [phone]  Name: NIVIN BRANIFF MRN: [id number] Date of Birth: Feb 13, 1936     PHYSICAL THERAPY DISCHARGE SUMMARY  Visits from Start of Care: 14  Current functional level related to goals / functional outcomes: Pt is now in a nursing home.  Nurses at Nursing home will put [place] on pt until his juxta fit have arrived.  [facility] contacted re pt now residing at SNF>    Remaining deficits: See above    Education / Equipment: HEP; the need to have compression on his LE at all times   Plan: Patient agrees to discharge.  Patient goals were partially met. Patient is being discharged due to a change in medical status.  ?????         Rayetta Humphrey, [place] CLT [phone]

## 2015-08-05 DIAGNOSIS — E119 Type 2 diabetes mellitus without complications: Secondary | ICD-10-CM | POA: Diagnosis not present

## 2015-08-05 DIAGNOSIS — R278 Other lack of coordination: Secondary | ICD-10-CM | POA: Diagnosis not present

## 2015-08-05 DIAGNOSIS — K219 Gastro-esophageal reflux disease without esophagitis: Secondary | ICD-10-CM | POA: Diagnosis not present

## 2015-08-05 DIAGNOSIS — N183 Chronic kidney disease, stage 3 (moderate): Secondary | ICD-10-CM | POA: Diagnosis not present

## 2015-08-05 DIAGNOSIS — L03119 Cellulitis of unspecified part of limb: Secondary | ICD-10-CM | POA: Diagnosis not present

## 2015-08-05 DIAGNOSIS — K921 Melena: Secondary | ICD-10-CM | POA: Diagnosis not present

## 2015-08-05 DIAGNOSIS — I428 Other cardiomyopathies: Secondary | ICD-10-CM | POA: Diagnosis not present

## 2015-08-05 DIAGNOSIS — E538 Deficiency of other specified B group vitamins: Secondary | ICD-10-CM | POA: Diagnosis not present

## 2015-08-05 DIAGNOSIS — R001 Bradycardia, unspecified: Secondary | ICD-10-CM | POA: Diagnosis not present

## 2015-08-05 DIAGNOSIS — I429 Cardiomyopathy, unspecified: Secondary | ICD-10-CM | POA: Diagnosis not present

## 2015-08-05 DIAGNOSIS — Z01818 Encounter for other preprocedural examination: Secondary | ICD-10-CM | POA: Diagnosis not present

## 2015-08-05 DIAGNOSIS — R262 Difficulty in walking, not elsewhere classified: Secondary | ICD-10-CM | POA: Diagnosis not present

## 2015-08-05 DIAGNOSIS — J449 Chronic obstructive pulmonary disease, unspecified: Secondary | ICD-10-CM | POA: Diagnosis not present

## 2015-08-05 DIAGNOSIS — I959 Hypotension, unspecified: Secondary | ICD-10-CM | POA: Diagnosis not present

## 2015-08-05 DIAGNOSIS — L89153 Pressure ulcer of sacral region, stage 3: Secondary | ICD-10-CM | POA: Diagnosis not present

## 2015-08-05 DIAGNOSIS — I078 Other rheumatic tricuspid valve diseases: Secondary | ICD-10-CM | POA: Diagnosis not present

## 2015-08-05 DIAGNOSIS — S81809A Unspecified open wound, unspecified lower leg, initial encounter: Secondary | ICD-10-CM | POA: Diagnosis not present

## 2015-08-05 DIAGNOSIS — N2581 Secondary hyperparathyroidism of renal origin: Secondary | ICD-10-CM | POA: Diagnosis not present

## 2015-08-05 DIAGNOSIS — D631 Anemia in chronic kidney disease: Secondary | ICD-10-CM | POA: Diagnosis not present

## 2015-08-05 DIAGNOSIS — M6200 Separation of muscle (nontraumatic), unspecified site: Secondary | ICD-10-CM | POA: Diagnosis not present

## 2015-08-05 DIAGNOSIS — E785 Hyperlipidemia, unspecified: Secondary | ICD-10-CM | POA: Diagnosis not present

## 2015-08-05 DIAGNOSIS — I5032 Chronic diastolic (congestive) heart failure: Secondary | ICD-10-CM | POA: Diagnosis not present

## 2015-08-05 DIAGNOSIS — I2510 Atherosclerotic heart disease of native coronary artery without angina pectoris: Secondary | ICD-10-CM | POA: Diagnosis not present

## 2015-08-05 DIAGNOSIS — L89610 Pressure ulcer of right heel, unstageable: Secondary | ICD-10-CM | POA: Diagnosis not present

## 2015-08-05 DIAGNOSIS — D649 Anemia, unspecified: Secondary | ICD-10-CM | POA: Diagnosis not present

## 2015-08-05 DIAGNOSIS — M6281 Muscle weakness (generalized): Secondary | ICD-10-CM | POA: Diagnosis not present

## 2015-08-05 DIAGNOSIS — I4891 Unspecified atrial fibrillation: Secondary | ICD-10-CM | POA: Diagnosis not present

## 2015-08-05 DIAGNOSIS — H109 Unspecified conjunctivitis: Secondary | ICD-10-CM | POA: Diagnosis not present

## 2015-08-05 DIAGNOSIS — E559 Vitamin D deficiency, unspecified: Secondary | ICD-10-CM | POA: Diagnosis not present

## 2015-08-05 DIAGNOSIS — I10 Essential (primary) hypertension: Secondary | ICD-10-CM | POA: Diagnosis not present

## 2015-08-05 DIAGNOSIS — M1990 Unspecified osteoarthritis, unspecified site: Secondary | ICD-10-CM | POA: Diagnosis not present

## 2015-08-05 DIAGNOSIS — N184 Chronic kidney disease, stage 4 (severe): Secondary | ICD-10-CM | POA: Diagnosis not present

## 2015-08-05 DIAGNOSIS — D638 Anemia in other chronic diseases classified elsewhere: Secondary | ICD-10-CM | POA: Diagnosis not present

## 2015-08-08 DIAGNOSIS — S81809A Unspecified open wound, unspecified lower leg, initial encounter: Secondary | ICD-10-CM | POA: Diagnosis not present

## 2015-08-08 DIAGNOSIS — K219 Gastro-esophageal reflux disease without esophagitis: Secondary | ICD-10-CM | POA: Diagnosis not present

## 2015-08-08 DIAGNOSIS — I078 Other rheumatic tricuspid valve diseases: Secondary | ICD-10-CM | POA: Diagnosis not present

## 2015-08-08 DIAGNOSIS — R262 Difficulty in walking, not elsewhere classified: Secondary | ICD-10-CM | POA: Diagnosis not present

## 2015-08-09 ENCOUNTER — Ambulatory Visit (HOSPITAL_COMMUNITY): Payer: Medicare Other | Admitting: Physical Therapy

## 2015-08-09 ENCOUNTER — Other Ambulatory Visit: Payer: Self-pay | Admitting: *Deleted

## 2015-08-09 ENCOUNTER — Encounter: Payer: Self-pay | Admitting: *Deleted

## 2015-08-09 ENCOUNTER — Other Ambulatory Visit: Payer: Self-pay | Admitting: Licensed Clinical Social Worker

## 2015-08-09 NOTE — Patient Outreach (Signed)
Call from [place], LCSW. He reports that per patient caregiver, patient was admitted to [facility] skilled nursing facility on 08/05/15 for short term rehab and will remain long term care.  Lorna Few will follow up with patient and caregiver. No nursing needs or concerns assessed. Plan: Will close patient and send MD and patient case closure. This RNCM will sign off case. Alben Spittle. Albertha Ghee, RN, BSN, CCM  [facility] [employer] [phone]

## 2015-08-09 NOTE — Patient Outreach (Signed)
Assessment:  CSW received call on 08/09/15 from [name], cousin and care provider for client.  CSW verified identity of [name]. [name] and CSW spoke of client needs.  [name] informed CSW that client had recently been admitted to [facility] facility in [place], [place]. Client is receiving nursing care at facility. Client is also receiving physical therapy sessions as scheduled at facility.  [name] said she visited client recently at [facility] facility and that he was adjusting well to facility.  [name] said client is still receiving care in wrapping lower extremities as needed.  Client is using walker to ambulate.  CSW thanked [place] for update information on client placement at [facility]. She had previously gone to [facility] in [place], [place] and applied for [employer] coverage for client for long term care in skilled nursing facility.  CSW informed [name] that CSW would convey above information to RN Verdie Drown on 08/09/15. [name] agreed to this plan.   Plan:  Client to participate in all scheduled client physical therapy sessions for client in next 30 days at [facility] facility. CSW to contact client as scheduled to assess needs of client at that time.  Kelton Pillar.Adams Hinch MSW, LCSW Licensed Clinical Social Worker [facility] Care Management [phone]

## 2015-08-10 ENCOUNTER — Other Ambulatory Visit: Payer: Self-pay | Admitting: Licensed Clinical Social Worker

## 2015-08-10 NOTE — Patient Outreach (Signed)
[facility]) Care Management  [facility] Social Work  08/10/2015  [name] Rice 1935-06-28 [id number]  Subjective:    Objective:   Encounter Medications:  Outpatient Encounter Prescriptions as of 08/10/2015  Medication Sig Note  . aspirin 81 MG tablet Take 81 mg by mouth daily.     . carvedilol (COREG) 6.25 MG tablet take 1 tablet by mouth twice a day with meals   . Cholecalciferol (VITAMIN D3) 1000 UNITS CAPS Take 1 capsule by mouth daily.   . cyanocobalamin (,VITAMIN B-12,) 1000 MCG/ML injection  08/19/2014: Received from: External Pharmacy  . FERREX 150 150 MG capsule Take 150 mg by mouth daily. Reported on 07/11/2015 02/21/2015: Received from: External Pharmacy  . furosemide (LASIX) 40 MG tablet Take 40 mg by mouth daily.   . hydrALAZINE (APRESOLINE) 50 MG tablet Take 1 tablet (50 mg total) by mouth 3 (three) times daily.   . Multiple Vitamins-Minerals (VISION-VITE PRESERVE PO) Take by mouth.   . simvastatin (ZOCOR) 5 MG tablet Take 1 tablet (5 mg total) by mouth at bedtime.   . sulfacetamide (BLEPH-10) 10 % ophthalmic solution 1 drop every 4 (four) hours. 07/11/2015: Left eye  . tiotropium (SPIRIVA) 18 MCG inhalation capsule Place 18 mcg into inhaler and inhale daily.     No facility-administered encounter medications on file as of 08/10/2015.    Functional Status:  In your present state of health, do you have any difficulty performing the following activities: 08/09/2015 07/11/2015  Hearing? Y (No Data)  Vision? Y (No Data)  Difficulty concentrating or making decisions? Y -  Walking or climbing stairs? Y (No Data)  Dressing or bathing? Y (No Data)  Doing errands, shopping? Y (No Data)  Preparing Food and eating ? Y -  Using the Toilet? N -  In the past six months, have you accidently leaked urine? Y -  Do you have problems with loss of bowel control? Y -  Managing your Medications? Y -  Managing your Finances? Y -  Housekeeping or managing your Housekeeping? Y -     Fall/Depression Screening:  PHQ 2/9 Scores 08/09/2015 07/27/2015 06/27/2015  PHQ - 2 Score PHQ- 9 Score Assessment:   CSW traveled to [facility] facility in [place], [place] on 08/10/15 and visited with client on 08/10/15 at client's room at [facility] facility.   Patient assessed in  Skilled Nursing Facility for continued care needs. CSW will continue to collaborate with the skilled nursing facility social worker to facilitate discharge planning needs and communicate with the patient and family.  Client is receiving skilled nursing care at facility. Client is receiving physical therapy sessions, as scheduled, at facility.  Client has support from his cousin, [name] Rice. [name] Rice has been very helpful to client. She went to [facility] in [place], [place] previously and applied for [employer] for skilled nursing facility level of care for client.  She visits client regularly at facility. Client said he is eating well and sleeping well.  Client said he has right leg wrapped currently and left leg is no longer wrapped. He said he has some burning in right leg.  He has talked with RN and doctor about burning in right leg.  Client is using a walker to help him ambulate. Client uses glasses to help with vision He said he hears adequately.  Client said he has not fallen at facility.  Client is not sure of discharge  plan at present.  CSW encouraged client on 08/10/15 for client to speak with facility social worker, Efraim Kaufmann, to discuss discharge plans or options for client. Client said that his right leg was wrapped recently at facility.  Client is adjusting to residing at [facility] facility. He said he is cooperating well with all facility care givers. He said he was receiving adequate care at facility. CSW gave client [facility] CSW card with CSW number on card ([phone]).   CSW encouraged that client or [name] Rice call CSW as needed to discuss social work needs of ciient.   CSW thanked client for allowing CSW to visit client at [facility] facility on 08/10/15.    Plan:  Client to participate in all scheduled client physical therapy sessions in next 30 days at [facility] facility. CSW to call client in 3 weeks to assess needs of client at that time.    [name] MSW, LCSW Licensed Clinical Social Worker [facility] Care Management [phone]

## 2015-08-11 ENCOUNTER — Ambulatory Visit (HOSPITAL_COMMUNITY): Payer: Medicare Other | Admitting: Physical Therapy

## 2015-08-16 ENCOUNTER — Encounter (HOSPITAL_COMMUNITY)
Admission: RE | Admit: 2015-08-16 | Discharge: 2015-08-16 | Disposition: A | Discharge status: Home / Self Care | Payer: Medicare Other | Source: Ambulatory Visit | Attending: Nephrology | Admitting: Nephrology

## 2015-08-16 ENCOUNTER — Ambulatory Visit (HOSPITAL_COMMUNITY): Payer: Medicare Other | Admitting: Physical Therapy

## 2015-08-16 DIAGNOSIS — N184 Chronic kidney disease, stage 4 (severe): Secondary | ICD-10-CM | POA: Diagnosis not present

## 2015-08-16 DIAGNOSIS — D631 Anemia in chronic kidney disease: Secondary | ICD-10-CM | POA: Diagnosis not present

## 2015-08-16 LAB — HEMOGLOBIN AND HEMATOCRIT, BLOOD
HCT: 28.7 % — ABNORMAL LOW (ref 39.0–52.0)
Hemoglobin: 8.8 g/dL — ABNORMAL LOW (ref 13.0–17.0)

## 2015-08-16 MED ORDER — EPOETIN ALFA 2000 UNIT/ML IJ SOLN
2000.0000 [IU] | INTRAMUSCULAR | Status: DC
  Administered 2015-08-16: 2000 [IU] via SUBCUTANEOUS
  Filled 2015-08-16: qty 1

## 2015-08-16 NOTE — Progress Notes (Signed)
Results for SMITTY, COLAN (MRN [id number]) as of 08/16/2015 10:41  Ref. Range 08/16/2015 09:50  Hemoglobin Latest Ref Range: 13.0-17.0 g/dL 8.8 (L)  HCT Latest Ref Range: 39.0-52.0 % 28.7 (L)   Procrit 2000 units administered

## 2015-08-17 DIAGNOSIS — L89153 Pressure ulcer of sacral region, stage 3: Secondary | ICD-10-CM | POA: Diagnosis not present

## 2015-08-17 DIAGNOSIS — L89610 Pressure ulcer of right heel, unstageable: Secondary | ICD-10-CM | POA: Diagnosis not present

## 2015-08-18 ENCOUNTER — Other Ambulatory Visit: Payer: Self-pay | Admitting: Licensed Clinical Social Worker

## 2015-08-18 NOTE — Patient Outreach (Signed)
Assessment:  CSW spoke via phone with client on 08/18/15. CSW verified client identity. CSW and client spoke of client needs. Client is residing at [facility] facility in [place], [place].  He is receiving nursing care as needed at facility. He is also receiving scheduled physical therapy sessions at facility.  He said he is adjusting well to facility. He said he is eating well and sleeping well. He said he is taking medications as prescribed. He said he is cooperating with care providers for client at facility. CSW and client spoke of client care plan. CSW encouraged client to participate in all scheduled physical therapy sessions for client in the next 30 days at [facility] facility.  Client said he is participating in scheduled physical therapy sessions for client at facility. He said physical therapy sessions for client at [facility] facility were very helpful to him. He said he continues to use his walker in ambulating in physical therapy sessions for client at facility Client has support from cousin, [name] Rice. [name] had gone previously to [facility] in [place] and had applied for [employer] for client at [facility]. Client said that [name] Rice planned to visit client tomorrow at facility. CSW encouraged client to call CSW at [phone] as needed to discuss social work needs of client.  CSW thanked [employer] for phone conversation with CSW on 08/18/15.    Plan:  Client to participate in all scheduled physical therapy sessions for client in next 30 days at [facility] facility. CSW to call client in 3 weeks to assess needs of client at that time.   [name] Rice.[name] Rice MSW, LCSW Licensed Clinical Social Worker [facility] Care Management [phone]

## 2015-08-23 ENCOUNTER — Encounter: Payer: Self-pay | Admitting: Cardiology

## 2015-08-23 ENCOUNTER — Ambulatory Visit (INDEPENDENT_AMBULATORY_CARE_PROVIDER_SITE_OTHER): Payer: Medicare Other | Admitting: Cardiology

## 2015-08-23 VITALS — BP 114/50 | HR 71 | Ht 67.0 in | Wt 141.0 lb

## 2015-08-23 DIAGNOSIS — N183 Chronic kidney disease, stage 3 unspecified: Secondary | ICD-10-CM

## 2015-08-23 DIAGNOSIS — I429 Cardiomyopathy, unspecified: Secondary | ICD-10-CM | POA: Diagnosis not present

## 2015-08-23 DIAGNOSIS — I5032 Chronic diastolic (congestive) heart failure: Secondary | ICD-10-CM

## 2015-08-23 NOTE — Patient Instructions (Signed)
Your physician wants you to follow-up in: 6 months You will receive a reminder letter in the mail two months in advance. If you don't receive a letter, please call our office to schedule the follow-up appointment.    Your physician recommends that you continue on your current medications as directed. Please refer to the Current Medication list given to you today.    If you need a refill on your cardiac medications before your next appointment, please call your pharmacy.     Thank you for choosing [facility] !        

## 2015-08-23 NOTE — Progress Notes (Signed)
Cardiology Office Note  Date: 08/23/2015   ID: [name] Rice, DOB 10-17-1935, MRN [id number]  PCP: Dwana Melena, MD  Primary Cardiologist: Nona Dell, MD   Chief Complaint  Patient presents with  . Diastolic heart failure    History of Present Illness: [name] Rice is a 80 y.o. male last seen in December 2016. He is here today for a follow-up visit, resides in [facility]. He is here with a friend who typically accompanies him. He reports better leg edema/lymphedema. He has been undergoing outpatient mechanical compression treatments for lymphedema. He also continues on Lasix and his weight is stable in comparison to December 2016.  I reviewed his medications which are outlined below. Blood pressure and heart rate are well controlled today.  Past Medical History  Diagnosis Date  . NICM (nonischemic cardiomyopathy) (HCC)     LVEF 55% 3/11  . Coronary atherosclerosis of native coronary artery     Nonobstructive  . Essential hypertension, benign   . COPD (chronic obstructive pulmonary disease) (HCC)   . Pulmonary hypertension (HCC)   . Mitral regurgitation     Moderate  . Ventricular dysfunction, right   . Tricuspid regurgitation   . Lower extremity edema   . GERD (gastroesophageal reflux disease)   . DJD (degenerative joint disease)   . Bradycardia   . Paroxysmal atrial fibrillation (HCC)   . CKD (chronic kidney disease), stage III     Has had temporary dialysis in the past  . HOH (hard of hearing)     Current Outpatient Prescriptions  Medication Sig Dispense Refill  . carvedilol (COREG) 6.25 MG tablet take 1 tablet by mouth twice a day with meals 60 tablet 6  . Cholecalciferol (VITAMIN D3) 3000 units TABS Take 2,000 Units by mouth daily.    Marland Kitchen FERREX 150 150 MG capsule Take 150 mg by mouth daily. Reported on 07/11/2015  0  . furosemide (LASIX) 20 MG tablet Take 20 mg by mouth daily.  0  . furosemide (LASIX) 40 MG tablet Take 40 mg by mouth daily.    .  hydrALAZINE (APRESOLINE) 50 MG tablet Take 1 tablet (50 mg total) by mouth 3 (three) times daily. 90 tablet 11  . Multiple Vitamins-Minerals (VISION-VITE PRESERVE PO) Take by mouth.    . SSD 1 % cream   0  . tiotropium (SPIRIVA) 18 MCG inhalation capsule Place 18 mcg into inhaler and inhale daily.     . vitamin C (ASCORBIC ACID) 500 MG tablet Take 500 mg by mouth 2 (two) times daily.    Marland Kitchen zinc sulfate 220 (50 Zn) MG capsule Take 220 mg by mouth daily.     No current facility-administered medications for this visit.   Allergies:  Review of patient's allergies indicates no known allergies.   Social History: The patient  reports that he quit smoking about 14 years ago. His smoking use included Cigarettes. He started smoking about 54 years ago. He has a 40 pack-year smoking history. He has never used smokeless tobacco. He reports that he drinks alcohol. He reports that he does not use illicit drugs.   ROS:  Please see the history of present illness. Otherwise, complete review of systems is positive for declining memory, unsteadiness on his feet.  All other systems are reviewed and negative.   Physical Exam: VS:  BP 114/50 mmHg  Pulse 71  Ht 5\' 7"  (1.702 m)  Wt 141 lb (63.957 kg)  BMI 22.08 kg/m2  SpO2 96%,  BMI Body mass index is 22.08 kg/(m^2).  Wt Readings from Last 3 Encounters:  08/23/15 141 lb (63.957 kg)  07/11/15 146 lb (66.225 kg)  05/03/15 140 lb (63.504 kg)    Chronically ill-appearing male in no acute distress, in wheelchair today.  HEENT: Conjunctiva and lids are normal, oropharynx clear with poor dentition.  Neck: Supple, no elevated JVP.  Lungs: Diminished breath sounds with rhonchi. Cardiac: Regular rate and rhythm, indistinct PMI, no S3 gallop.  Abdomen: Nontender, bowel sounds present, no tenderness.  Extremities: 1-2+ bilateral lower leg edema and significant venous stasis.   ECG: I personally reviewed the tracing from 11/19/2014 which showed sinus rhythm with  increased voltage and nonspecific ST-T changes.  Recent Labwork: 08/16/2015: Hemoglobin 8.8*     Component Value Date/Time   CHOL 130 05/05/2010 1722   TRIG 74 05/05/2010 1722   HDL 38* 05/05/2010 1722   CHOLHDL 3.4 Ratio 05/05/2010 1722   VLDL 15 05/05/2010 1722   LDLCALC 77 05/05/2010 1722  August 2016: BUN 29, creatinine 1.6, potassium 3.9  Other Studies Reviewed Today:  Echocardiogram 09/13/2011: Study Conclusions  - Left ventricle: The cavity size was normal. Wall thickness was increased in a pattern of mild LVH. Systolic function was normal. The estimated ejection fraction was in the range of 55% to 60%. Wall motion was normal; there were no regional wall motion abnormalities. Doppler parameters are consistent with abnormal left ventricular relaxation (grade 1 diastolic dysfunction). - Aortic valve: Trileaflet; mildly calcified leaflets. Mild regurgitation. Mean gradient: 9mm Hg (S). - Mitral valve: Mild regurgitation. - Atrial septum: No defect or patent foramen ovale was identified. - Tricuspid valve: Mild regurgitation. Peak gradient: 39mm Hg (D). - Pulmonary arteries: Systolic pressure was mildly to moderately increased. - Pericardium, extracardiac: There was no pericardial effusion.  Assessment and Plan:  1. History of nonischemic cardiomyopathy with improvement in LVEF but chronic diastolic heart failure. He remains clinically stable on current regimen. No changes were made today.  2. Chronic recurring leg edema and lymphedema. Outpatient compression treatments have been effective along with his diuretics.  3. CKD stage III, last creatinine 1.6.  Current medicines were reviewed with the patient today.  Disposition: FU with me in 6 months.   Signed, Jonelle Sidle, MD, [facility] 08/23/2015 11:14 AM    [facility] at [facility] [address], [place], [place] [address] Phone: [phone]; Fax: [phone]

## 2015-08-25 DIAGNOSIS — I959 Hypotension, unspecified: Secondary | ICD-10-CM | POA: Diagnosis not present

## 2015-08-25 DIAGNOSIS — I4891 Unspecified atrial fibrillation: Secondary | ICD-10-CM | POA: Diagnosis not present

## 2015-08-25 DIAGNOSIS — N184 Chronic kidney disease, stage 4 (severe): Secondary | ICD-10-CM | POA: Diagnosis not present

## 2015-08-30 ENCOUNTER — Encounter (HOSPITAL_COMMUNITY)
Admission: RE | Admit: 2015-08-30 | Discharge: 2015-08-30 | Disposition: A | Discharge status: Home / Self Care | Payer: Medicare Other | Source: Ambulatory Visit | Attending: Nephrology | Admitting: Nephrology

## 2015-08-30 ENCOUNTER — Encounter (HOSPITAL_COMMUNITY): Payer: Self-pay

## 2015-08-30 DIAGNOSIS — N184 Chronic kidney disease, stage 4 (severe): Secondary | ICD-10-CM | POA: Insufficient documentation

## 2015-08-30 DIAGNOSIS — D638 Anemia in other chronic diseases classified elsewhere: Secondary | ICD-10-CM | POA: Diagnosis not present

## 2015-08-30 LAB — HEMOGLOBIN AND HEMATOCRIT, BLOOD
HCT: 29.4 % — ABNORMAL LOW (ref 39.0–52.0)
HEMOGLOBIN: 9.4 g/dL — AB (ref 13.0–17.0)

## 2015-08-30 MED ORDER — EPOETIN ALFA 2000 UNIT/ML IJ SOLN
2000.0000 [IU] | Freq: Once | INTRAMUSCULAR | Status: AC
  Administered 2015-08-30: 2000 [IU] via SUBCUTANEOUS

## 2015-08-30 MED ORDER — EPOETIN ALFA 2000 UNIT/ML IJ SOLN
2000.0000 [IU] | INTRAMUSCULAR | Status: DC
  Filled 2015-08-30: qty 1

## 2015-08-30 NOTE — Progress Notes (Signed)
Results for [name] Rice, [name] Rice (MRN [id number]) as of 08/30/2015 10:45  Ref. Range 08/30/2015 09:43  Hemoglobin Latest Ref Range: 13.0-17.0 g/dL 9.4 (L)  HCT Latest Ref Range: 39.0-52.0 % 29.4 (L)

## 2015-08-31 ENCOUNTER — Encounter: Payer: Self-pay | Admitting: Licensed Clinical Social Worker

## 2015-08-31 ENCOUNTER — Other Ambulatory Visit: Payer: Self-pay | Admitting: Licensed Clinical Social Worker

## 2015-08-31 NOTE — Patient Outreach (Signed)
Assessment:  CSW spoke via phone with client on 08/31/15. CSW verified client identity. CSW and client spoke of client needs.  Client is currently residing at [facility] facility in [place]. He is receiving nursing care and physical therapy support services at facility.  He has support from his cousin, [name].  She visits client regularly at facility.  Client said that physical therapy sessions are helpful to client.  Client is eating well and sleeping well.  Client uses walker to help him ambulate. He said he is adjusting to being at [facility] facility for care. Client is receiving nursing support as needed at facility. CSW encouraged client to participate in all scheduled client physical therapy sessions for client in next 30 days at [facility] facility.  CSW encouraged client to call CSW at [phone] as needed to discuss social work needs of client.    Plan:  Client to participate in all scheduled client physical therapy sessions for client in next 30 days at [facility] facility. CSW to communicate with client in 4 weeks to assess needs of client.  Kelton Pillar.Jacaden Forbush MSW, LCSW Licensed Clinical Social Worker [facility] Care Management [phone]

## 2015-09-07 ENCOUNTER — Other Ambulatory Visit: Payer: Self-pay | Admitting: Licensed Clinical Social Worker

## 2015-09-07 NOTE — Patient Outreach (Signed)
Assessment:  CSW spoke via phone with client on 09/07/15. CSW verified client identity. CSW and client spoke of client needs. Client is receiving nursing care at facility. Client is also receiving physical therapy services at facility. Client said that physical therapy sessions for client are very helpful to client. Client has support from his cousin [name] Rice. [name] Rice visits client regularly and also speaks regularly with client via phone. CSW and client spoke of client care plan. CSW encouraged client to participate in all scheduled client physical therapy sessions for client in the next 30 days at [facility] facility. Client is taking medications as prescribed. He is adjusting to being at facility.  He said he is cooperating with care providers at facility.  Client is eating well and sleeping well.  He said that his lower extremities were no longer having to be wrapped. He said that swelling had gone down in his lower extremities and he was glad that this swelling had gone down.  CSW thanked client for phone conversation with CSW on 09/07/15. CSW encouraged [name] Rice to call CSW at [phone] as needed to discuss social work needs of client.     Plan:  Client to participate in all scheduled client physical therapy sessions for client at [facility] facility in next 30 days. CSW to call client in 4 weeks to assess client needs at that time.  [name] Rice.[name] Rice MSW, LCSW Licensed Clinical Social Worker [facility] Care Management [phone]

## 2015-09-12 ENCOUNTER — Other Ambulatory Visit: Payer: Self-pay | Admitting: Licensed Clinical Social Worker

## 2015-09-12 ENCOUNTER — Encounter: Payer: Self-pay | Admitting: Licensed Clinical Social Worker

## 2015-09-12 NOTE — Patient Outreach (Signed)
Assessment:  CSW spoke via phone with Gerald Leitz, cousin and former caregiver for client, on 09/12/15. CSW verified identity of Gerald Leitz. [name] and CSW spoke of client needs. [name] said that client was still receiving nursing care and physical therapy support at facility. She said client did have several falls at facility in recent weeks. She and CSW spoke of her communicating with Medicaid caseworker for client at local Department of Social [employer] to finalize Medicaid application for client for long term Medicaid for skilled nursing level of care. CSW encouraged [name] to communicate as needed with Medicaid caseworker regarding client's Medicaid application.  [name] said that she and client plan on client remaining at nursing facility for long term care. She said client could not reside at his apartment safely any longer, had difficulty with completing activities of daily living, was a fall risk, and could not prepare his meals. She said she and client planned on client remaining at skilled nursing facility for long term care. CSW thus informed [name] that CSW would discharge client from [facility] on 09/12/15 since client was meeting care plan goals and since client planned to remain at nursing facility for long term care. [name] agreed to this plan and said she would inform client on 09/12/15 of the above information regarding client.  Bertram Millard and client also have [facility] phone number of [phone] to contact CSW if either has further questions for CSW about [facility] services. CSW thanked [place] and client for working well with [facility] staff and [facility] CSW in recent months.  CSW thanked [place] for phone call with CSW on 09/12/15.   Plan:  CSW is discharging [place] from [facility] on 09/12/15 since client has met care plan goals with CSW services and since client plans to remain for long term care at skilled nursing facility. CSW to inform Verlon Setting on 09/12/15 that [facility] discharged client on 09/12/15 from  [facility] services. CSW to fax physician case closure letter to Dr. Nevada Crane informing Dr. [place] Crane that [facility] discharged client from [facility] services on 09/12/15 since client plans to remain at skilled nursing facility for long term care.  Norva Riffle.Abdulai Blaylock MSW, LCSW Licensed Clinical Social Worker [facility] Care Management [phone]

## 2015-09-13 ENCOUNTER — Encounter (HOSPITAL_COMMUNITY): Payer: Medicare Other

## 2015-09-13 ENCOUNTER — Encounter (HOSPITAL_COMMUNITY): Admission: RE | Admit: 2015-09-13 | Payer: Medicare Other | Source: Ambulatory Visit

## 2015-09-15 ENCOUNTER — Encounter (HOSPITAL_COMMUNITY)
Admission: RE | Admit: 2015-09-15 | Discharge: 2015-09-15 | Disposition: A | Discharge status: Home / Self Care | Payer: Medicare Other | Source: Ambulatory Visit | Attending: Nephrology | Admitting: Nephrology

## 2015-09-15 ENCOUNTER — Encounter (HOSPITAL_COMMUNITY): Payer: Self-pay

## 2015-09-15 DIAGNOSIS — D638 Anemia in other chronic diseases classified elsewhere: Secondary | ICD-10-CM | POA: Diagnosis not present

## 2015-09-15 DIAGNOSIS — Z01818 Encounter for other preprocedural examination: Secondary | ICD-10-CM | POA: Diagnosis not present

## 2015-09-15 DIAGNOSIS — N184 Chronic kidney disease, stage 4 (severe): Secondary | ICD-10-CM | POA: Diagnosis not present

## 2015-09-15 LAB — HEMOGLOBIN AND HEMATOCRIT, BLOOD
HEMATOCRIT: 26.6 % — AB (ref 39.0–52.0)
HEMOGLOBIN: 8.3 g/dL — AB (ref 13.0–17.0)

## 2015-09-15 MED ORDER — EPOETIN ALFA 2000 UNIT/ML IJ SOLN
2000.0000 [IU] | Freq: Once | INTRAMUSCULAR | Status: AC
  Administered 2015-09-15: 2000 [IU] via SUBCUTANEOUS

## 2015-09-15 MED ORDER — EPOETIN ALFA 2000 UNIT/ML IJ SOLN
2000.0000 [IU] | Freq: Once | INTRAMUSCULAR | Status: DC
  Filled 2015-09-15: qty 1

## 2015-09-15 NOTE — Progress Notes (Signed)
Results for JACAI, COSSAIRT (MRN [id number]) as of 09/15/2015 10:53  Ref. Range 09/15/2015 10:07  Hemoglobin Latest Ref Range: 13.0-17.0 g/dL 8.3 (L)  HCT Latest Ref Range: 39.0-52.0 % 26.6 (L)

## 2015-09-21 DIAGNOSIS — E559 Vitamin D deficiency, unspecified: Secondary | ICD-10-CM | POA: Diagnosis not present

## 2015-09-21 DIAGNOSIS — N184 Chronic kidney disease, stage 4 (severe): Secondary | ICD-10-CM | POA: Diagnosis not present

## 2015-09-22 DIAGNOSIS — M6200 Separation of muscle (nontraumatic), unspecified site: Secondary | ICD-10-CM | POA: Diagnosis not present

## 2015-09-22 DIAGNOSIS — I4891 Unspecified atrial fibrillation: Secondary | ICD-10-CM | POA: Diagnosis not present

## 2015-09-22 DIAGNOSIS — I959 Hypotension, unspecified: Secondary | ICD-10-CM | POA: Diagnosis not present

## 2015-09-22 DIAGNOSIS — N184 Chronic kidney disease, stage 4 (severe): Secondary | ICD-10-CM | POA: Diagnosis not present

## 2015-09-22 DIAGNOSIS — D649 Anemia, unspecified: Secondary | ICD-10-CM | POA: Diagnosis not present

## 2015-09-27 ENCOUNTER — Ambulatory Visit: Payer: Self-pay | Admitting: Licensed Clinical Social Worker

## 2015-09-28 DIAGNOSIS — N250 Renal osteodystrophy: Secondary | ICD-10-CM | POA: Diagnosis not present

## 2015-09-28 DIAGNOSIS — I509 Heart failure, unspecified: Secondary | ICD-10-CM | POA: Diagnosis not present

## 2015-09-28 DIAGNOSIS — D509 Iron deficiency anemia, unspecified: Secondary | ICD-10-CM | POA: Diagnosis not present

## 2015-09-28 DIAGNOSIS — N183 Chronic kidney disease, stage 3 (moderate): Secondary | ICD-10-CM | POA: Diagnosis not present

## 2015-09-28 DIAGNOSIS — I10 Essential (primary) hypertension: Secondary | ICD-10-CM | POA: Diagnosis not present

## 2015-09-29 ENCOUNTER — Encounter (HOSPITAL_COMMUNITY)
Admission: RE | Admit: 2015-09-29 | Discharge: 2015-09-29 | Disposition: A | Discharge status: Home / Self Care | Payer: Medicare Other | Source: Ambulatory Visit | Attending: Nephrology | Admitting: Nephrology

## 2015-09-29 ENCOUNTER — Encounter (HOSPITAL_COMMUNITY): Payer: Medicare Other

## 2015-09-29 DIAGNOSIS — M6200 Separation of muscle (nontraumatic), unspecified site: Secondary | ICD-10-CM | POA: Diagnosis not present

## 2015-09-29 DIAGNOSIS — N184 Chronic kidney disease, stage 4 (severe): Secondary | ICD-10-CM | POA: Diagnosis not present

## 2015-09-29 NOTE — Progress Notes (Signed)
Pt. Did not show up for appt.  [facility] notified. Will reschedule.

## 2015-10-04 ENCOUNTER — Encounter (HOSPITAL_COMMUNITY)
Admission: RE | Admit: 2015-10-04 | Discharge: 2015-10-04 | Disposition: A | Discharge status: Home / Self Care | Payer: Medicare Other | Source: Ambulatory Visit | Attending: Nephrology | Admitting: Nephrology

## 2015-10-04 DIAGNOSIS — D509 Iron deficiency anemia, unspecified: Secondary | ICD-10-CM | POA: Insufficient documentation

## 2015-10-04 LAB — HEMOGLOBIN AND HEMATOCRIT, BLOOD
HEMATOCRIT: 27 % — AB (ref 39.0–52.0)
HEMOGLOBIN: 8.3 g/dL — AB (ref 13.0–17.0)

## 2015-10-04 MED ORDER — EPOETIN ALFA 2000 UNIT/ML IJ SOLN
INTRAMUSCULAR | Status: AC
  Filled 2015-10-04: qty 1

## 2015-10-04 MED ORDER — EPOETIN ALFA 2000 UNIT/ML IJ SOLN
2000.0000 [IU] | INTRAMUSCULAR | Status: DC
  Administered 2015-10-04: 2000 [IU] via SUBCUTANEOUS

## 2015-10-04 NOTE — Progress Notes (Signed)
Results for MUZAMMIL, HETTICH (MRN [id number]) as of 10/04/2015 14:04  Ref. Range 10/04/2015 12:20  Hemoglobin Latest Ref Range: 13.0-17.0 g/dL 8.3 (L)  HCT Latest Ref Range: 39.0-52.0 % 27.0 (L)   Procrit 2000 units SQ given per MD order.

## 2015-10-05 DIAGNOSIS — D509 Iron deficiency anemia, unspecified: Secondary | ICD-10-CM | POA: Diagnosis not present

## 2015-10-06 DIAGNOSIS — I4891 Unspecified atrial fibrillation: Secondary | ICD-10-CM | POA: Diagnosis not present

## 2015-10-06 DIAGNOSIS — M6200 Separation of muscle (nontraumatic), unspecified site: Secondary | ICD-10-CM | POA: Diagnosis not present

## 2015-10-06 DIAGNOSIS — I959 Hypotension, unspecified: Secondary | ICD-10-CM | POA: Diagnosis not present

## 2015-10-06 DIAGNOSIS — N184 Chronic kidney disease, stage 4 (severe): Secondary | ICD-10-CM | POA: Diagnosis not present

## 2015-10-06 DIAGNOSIS — D649 Anemia, unspecified: Secondary | ICD-10-CM | POA: Diagnosis not present

## 2015-10-07 ENCOUNTER — Ambulatory Visit: Payer: Medicare Other | Admitting: Licensed Clinical Social Worker

## 2015-10-10 DIAGNOSIS — Z789 Other specified health status: Secondary | ICD-10-CM | POA: Diagnosis not present

## 2015-10-11 DIAGNOSIS — L97819 Non-pressure chronic ulcer of other part of right lower leg with unspecified severity: Secondary | ICD-10-CM | POA: Diagnosis not present

## 2015-10-11 DIAGNOSIS — L89150 Pressure ulcer of sacral region, unstageable: Secondary | ICD-10-CM | POA: Diagnosis not present

## 2015-10-13 DIAGNOSIS — L03119 Cellulitis of unspecified part of limb: Secondary | ICD-10-CM | POA: Diagnosis not present

## 2015-10-13 DIAGNOSIS — N184 Chronic kidney disease, stage 4 (severe): Secondary | ICD-10-CM | POA: Diagnosis not present

## 2015-10-14 DIAGNOSIS — M6200 Separation of muscle (nontraumatic), unspecified site: Secondary | ICD-10-CM | POA: Diagnosis not present

## 2015-10-14 DIAGNOSIS — L03119 Cellulitis of unspecified part of limb: Secondary | ICD-10-CM | POA: Diagnosis not present

## 2015-10-14 DIAGNOSIS — E785 Hyperlipidemia, unspecified: Secondary | ICD-10-CM | POA: Diagnosis not present

## 2015-10-14 DIAGNOSIS — N184 Chronic kidney disease, stage 4 (severe): Secondary | ICD-10-CM | POA: Diagnosis not present

## 2015-10-18 ENCOUNTER — Encounter (HOSPITAL_COMMUNITY)
Admission: RE | Admit: 2015-10-18 | Discharge: 2015-10-18 | Disposition: A | Discharge status: Home / Self Care | Payer: Medicare Other | Source: Ambulatory Visit | Attending: Nephrology | Admitting: Nephrology

## 2015-10-18 DIAGNOSIS — D509 Iron deficiency anemia, unspecified: Secondary | ICD-10-CM | POA: Diagnosis not present

## 2015-10-18 LAB — HEMOGLOBIN AND HEMATOCRIT, BLOOD
HCT: 28.5 % — ABNORMAL LOW (ref 39.0–52.0)
Hemoglobin: 8.8 g/dL — ABNORMAL LOW (ref 13.0–17.0)

## 2015-10-18 MED ORDER — EPOETIN ALFA 2000 UNIT/ML IJ SOLN
2000.0000 [IU] | Freq: Once | INTRAMUSCULAR | Status: AC
  Administered 2015-10-18: 2000 [IU] via SUBCUTANEOUS
  Filled 2015-10-18: qty 1

## 2015-10-18 MED ORDER — EPOETIN ALFA 2000 UNIT/ML IJ SOLN
INTRAMUSCULAR | Status: AC
  Filled 2015-10-18: qty 1

## 2015-10-20 ENCOUNTER — Ambulatory Visit (INDEPENDENT_AMBULATORY_CARE_PROVIDER_SITE_OTHER): Payer: Medicare Other | Admitting: Internal Medicine

## 2015-10-20 ENCOUNTER — Encounter (INDEPENDENT_AMBULATORY_CARE_PROVIDER_SITE_OTHER): Payer: Self-pay | Admitting: Internal Medicine

## 2015-10-20 VITALS — BP 140/80 | HR 76 | Temp 97.3°F | Ht 68.0 in | Wt 137.1 lb

## 2015-10-20 DIAGNOSIS — D509 Iron deficiency anemia, unspecified: Secondary | ICD-10-CM

## 2015-10-20 DIAGNOSIS — Z789 Other specified health status: Secondary | ICD-10-CM | POA: Diagnosis not present

## 2015-10-20 DIAGNOSIS — N184 Chronic kidney disease, stage 4 (severe): Secondary | ICD-10-CM | POA: Diagnosis not present

## 2015-10-20 NOTE — Patient Instructions (Signed)
OV in 6 months. Patient declined colonoscopy today.

## 2015-10-20 NOTE — Progress Notes (Signed)
Subjective:    Patient ID: [name] Rice, male    DOB: 08/28/1935, 80 y.o.   MRN: [id number]  HPI Referred by Dr. Margo Aye (Patient is a resident of [facility]) for anemia. Hx of same. From records anemia as far back as 2013. Patient is w/ch bound. Patient denies seeing any rectal bleeding.  Receives Procrit 2000units every 2 weeks.  Patient has never undergone a colonoscopy.  No family hx of colon cancer. Appetite is good. ? 5-10 pound weight loss over a month.    10/18/2015 Hemoglobin 8.8 10/06/2015 H and H 7.4 and 21.9 10/23/2014 Ferritin 89, iron 46, Iron sat 17, TIBC 274.  09/15/2015 Creatinine 1.37, BUN 1.37.    Hx of severe arthritis. Hx significant for CAD, atrial fib, cardiomyopathy, CKD. Echo 09/03/2011 EF 55-60 Review of Systems Past Medical History:  Diagnosis Date  . Bradycardia   . CKD (chronic kidney disease), stage III    Has had temporary dialysis in the past  . COPD (chronic obstructive pulmonary disease) (HCC)   . Coronary atherosclerosis of native coronary artery    Nonobstructive  . DJD (degenerative joint disease)   . Essential hypertension, benign   . GERD (gastroesophageal reflux disease)   . HOH (hard of hearing)   . Lower extremity edema   . Mitral regurgitation    Moderate  . NICM (nonischemic cardiomyopathy) (HCC)    LVEF 55% 3/11  . Paroxysmal atrial fibrillation (HCC)   . Pulmonary hypertension (HCC)   . Tricuspid regurgitation   . Ventricular dysfunction, right     Past Surgical History:  Procedure Laterality Date  . CATARACT EXTRACTION W/ INTRAOCULAR LENS IMPLANT     right  . DIALYSIS FISTULA CREATION     left forearm  . TOTAL HIP ARTHROPLASTY  ~ 2005   left    No Known Allergies  Current Outpatient Prescriptions on File Prior to Visit  Medication Sig Dispense Refill  . carvedilol (COREG) 6.25 MG tablet take 1 tablet by mouth twice a day with meals 60 tablet 6  . Cholecalciferol (VITAMIN D3) 3000 units TABS Take 2,000 Units by mouth  daily.    Marland Kitchen FERREX 150 150 MG capsule Take 150 mg by mouth daily. Reported on 07/11/2015  0  . furosemide (LASIX) 20 MG tablet Take 20 mg by mouth daily.  0  . furosemide (LASIX) 40 MG tablet Take 40 mg by mouth daily.    . Multiple Vitamins-Minerals (VISION-VITE PRESERVE PO) Take by mouth.    . tiotropium (SPIRIVA) 18 MCG inhalation capsule Place 18 mcg into inhaler and inhale daily.     . vitamin C (ASCORBIC ACID) 500 MG tablet Take 500 mg by mouth 2 (two) times daily.    . hydrALAZINE (APRESOLINE) 50 MG tablet Take 1 tablet (50 mg total) by mouth 3 (three) times daily. 90 tablet 11  . SSD 1 % cream   0  . zinc sulfate 220 (50 Zn) MG capsule Take 220 mg by mouth daily.     No current facility-administered medications on file prior to visit.        Objective:   Physical Exam Blood pressure 140/80, pulse 76, temperature 97.3 F (36.3 C), height  (1.727 m), weight 137 lb 1.6 oz (62.2 kg). Alert and oriented. Skin warm and dry. Oral mucosa is moist.   . Sclera anicteric, conjunctivae is pink. Thyroid not enlarged. No cervical lymphadenopathy. Lungs clear. Heart regular rate and rhythm.  Abdomen is soft. Bowel sounds are positive.  No hepatomegaly. ? Bilateral inguinal hernias.  No tenderness.  No edema to lower extremities.  Two areas of skin breakdown to buttocks. Stools brown and guaiac negative.          Assessment & Plan:  IDA. Has never undergone a colonoscopy. Colonic neoplasm needs to be ruled out.  Patient has declined a colonoscopy today.

## 2015-10-25 DIAGNOSIS — L89150 Pressure ulcer of sacral region, unstageable: Secondary | ICD-10-CM | POA: Diagnosis not present

## 2015-10-25 DIAGNOSIS — L98419 Non-pressure chronic ulcer of buttock with unspecified severity: Secondary | ICD-10-CM | POA: Diagnosis not present

## 2015-10-27 DIAGNOSIS — Z79899 Other long term (current) drug therapy: Secondary | ICD-10-CM | POA: Diagnosis not present

## 2015-11-01 ENCOUNTER — Encounter (HOSPITAL_COMMUNITY)
Admission: RE | Admit: 2015-11-01 | Discharge: 2015-11-01 | Disposition: A | Discharge status: Home / Self Care | Payer: Medicare Other | Source: Ambulatory Visit | Attending: Nephrology | Admitting: Nephrology

## 2015-11-01 DIAGNOSIS — L89150 Pressure ulcer of sacral region, unstageable: Secondary | ICD-10-CM | POA: Diagnosis not present

## 2015-11-01 DIAGNOSIS — D509 Iron deficiency anemia, unspecified: Secondary | ICD-10-CM | POA: Diagnosis not present

## 2015-11-01 LAB — HEMOGLOBIN AND HEMATOCRIT, BLOOD
HCT: 30.8 % — ABNORMAL LOW (ref 39.0–52.0)
HEMOGLOBIN: 9.3 g/dL — AB (ref 13.0–17.0)

## 2015-11-01 MED ORDER — EPOETIN ALFA 2000 UNIT/ML IJ SOLN
INTRAMUSCULAR | Status: AC
  Filled 2015-11-01: qty 1

## 2015-11-01 MED ORDER — EPOETIN ALFA 2000 UNIT/ML IJ SOLN
2000.0000 [IU] | Freq: Once | INTRAMUSCULAR | Status: AC
  Administered 2015-11-01: 2000 [IU] via SUBCUTANEOUS

## 2015-11-03 DIAGNOSIS — L03119 Cellulitis of unspecified part of limb: Secondary | ICD-10-CM | POA: Diagnosis not present

## 2015-11-03 DIAGNOSIS — D649 Anemia, unspecified: Secondary | ICD-10-CM | POA: Diagnosis not present

## 2015-11-04 ENCOUNTER — Encounter (INDEPENDENT_AMBULATORY_CARE_PROVIDER_SITE_OTHER): Payer: Self-pay

## 2015-11-08 DIAGNOSIS — L89150 Pressure ulcer of sacral region, unstageable: Secondary | ICD-10-CM | POA: Diagnosis not present

## 2015-11-10 DIAGNOSIS — Z79899 Other long term (current) drug therapy: Secondary | ICD-10-CM | POA: Diagnosis not present

## 2015-11-10 DIAGNOSIS — L03119 Cellulitis of unspecified part of limb: Secondary | ICD-10-CM | POA: Diagnosis not present

## 2015-11-15 ENCOUNTER — Encounter (HOSPITAL_COMMUNITY)
Admission: RE | Admit: 2015-11-15 | Discharge: 2015-11-15 | Disposition: A | Discharge status: Home / Self Care | Payer: Medicare Other | Source: Ambulatory Visit | Attending: Nephrology | Admitting: Nephrology

## 2015-11-15 DIAGNOSIS — L89150 Pressure ulcer of sacral region, unstageable: Secondary | ICD-10-CM | POA: Diagnosis not present

## 2015-11-15 DIAGNOSIS — D509 Iron deficiency anemia, unspecified: Secondary | ICD-10-CM | POA: Diagnosis not present

## 2015-11-15 LAB — HEMOGLOBIN AND HEMATOCRIT, BLOOD
HEMATOCRIT: 29.8 % — AB (ref 39.0–52.0)
HEMOGLOBIN: 9 g/dL — AB (ref 13.0–17.0)

## 2015-11-15 MED ORDER — EPOETIN ALFA 2000 UNIT/ML IJ SOLN
INTRAMUSCULAR | Status: AC
  Filled 2015-11-15: qty 1

## 2015-11-15 MED ORDER — EPOETIN ALFA 2000 UNIT/ML IJ SOLN
2000.0000 [IU] | Freq: Once | INTRAMUSCULAR | Status: AC
  Administered 2015-11-15: 2000 [IU] via SUBCUTANEOUS

## 2015-11-15 NOTE — Progress Notes (Signed)
Results for KADARI, FIER (MRN [id number]) as of 11/15/2015 13:21  Ref. Range 11/15/2015 12:59  Hemoglobin Latest Ref Range: 13.0 - 17.0 g/dL 9.0 (L)  HCT Latest Ref Range: 39.0 - 52.0 % 29.8 (L)  Procrit 2000 units given for hgb 9.0 g/dl with no complaints voiced.  Appointment given for 2 weeks per written orders.

## 2015-11-17 DIAGNOSIS — L03119 Cellulitis of unspecified part of limb: Secondary | ICD-10-CM | POA: Diagnosis not present

## 2015-11-17 DIAGNOSIS — Z789 Other specified health status: Secondary | ICD-10-CM | POA: Diagnosis not present

## 2015-11-18 DIAGNOSIS — J449 Chronic obstructive pulmonary disease, unspecified: Secondary | ICD-10-CM | POA: Diagnosis not present

## 2015-11-18 DIAGNOSIS — I4891 Unspecified atrial fibrillation: Secondary | ICD-10-CM | POA: Diagnosis not present

## 2015-11-18 DIAGNOSIS — Z7409 Other reduced mobility: Secondary | ICD-10-CM | POA: Diagnosis not present

## 2015-11-18 DIAGNOSIS — I259 Chronic ischemic heart disease, unspecified: Secondary | ICD-10-CM | POA: Diagnosis not present

## 2015-11-22 DIAGNOSIS — L89150 Pressure ulcer of sacral region, unstageable: Secondary | ICD-10-CM | POA: Diagnosis not present

## 2015-11-22 DIAGNOSIS — L98412 Non-pressure chronic ulcer of buttock with fat layer exposed: Secondary | ICD-10-CM | POA: Diagnosis not present

## 2015-11-24 DIAGNOSIS — L03119 Cellulitis of unspecified part of limb: Secondary | ICD-10-CM | POA: Diagnosis not present

## 2015-11-24 DIAGNOSIS — Z789 Other specified health status: Secondary | ICD-10-CM | POA: Diagnosis not present

## 2015-11-29 ENCOUNTER — Encounter (HOSPITAL_COMMUNITY)
Admission: RE | Admit: 2015-11-29 | Discharge: 2015-11-29 | Disposition: A | Discharge status: Home / Self Care | Payer: Medicare Other | Source: Ambulatory Visit | Attending: Nephrology | Admitting: Nephrology

## 2015-11-29 ENCOUNTER — Encounter (HOSPITAL_COMMUNITY): Payer: Self-pay

## 2015-11-29 DIAGNOSIS — D509 Iron deficiency anemia, unspecified: Secondary | ICD-10-CM | POA: Diagnosis not present

## 2015-11-29 DIAGNOSIS — L89154 Pressure ulcer of sacral region, stage 4: Secondary | ICD-10-CM | POA: Diagnosis not present

## 2015-11-29 LAB — HEMOGLOBIN AND HEMATOCRIT, BLOOD
HEMATOCRIT: 30.2 % — AB (ref 39.0–52.0)
Hemoglobin: 9.2 g/dL — ABNORMAL LOW (ref 13.0–17.0)

## 2015-11-29 MED ORDER — EPOETIN ALFA 2000 UNIT/ML IJ SOLN
INTRAMUSCULAR | Status: AC
  Filled 2015-11-29: qty 1

## 2015-11-29 MED ORDER — EPOETIN ALFA 2000 UNIT/ML IJ SOLN
2000.0000 [IU] | Freq: Once | INTRAMUSCULAR | Status: AC
  Administered 2015-11-29: 2000 [IU] via SUBCUTANEOUS

## 2015-11-29 NOTE — Progress Notes (Signed)
Results for DEEGAN, MABEN (MRN [id number]) as of 11/29/2015 14:52 Procrit 2000 units given as indicated   Ref. Range 11/29/2015 13:00  Hemoglobin Latest Ref Range: 13.0 - 17.0 g/dL 9.2 (L)  HCT Latest Ref Range: 39.0 - 52.0 % 30.2 (L)

## 2015-12-02 DIAGNOSIS — Z7409 Other reduced mobility: Secondary | ICD-10-CM | POA: Diagnosis not present

## 2015-12-02 DIAGNOSIS — J449 Chronic obstructive pulmonary disease, unspecified: Secondary | ICD-10-CM | POA: Diagnosis not present

## 2015-12-02 DIAGNOSIS — I259 Chronic ischemic heart disease, unspecified: Secondary | ICD-10-CM | POA: Diagnosis not present

## 2015-12-02 DIAGNOSIS — I2510 Atherosclerotic heart disease of native coronary artery without angina pectoris: Secondary | ICD-10-CM | POA: Diagnosis not present

## 2015-12-06 DIAGNOSIS — L89154 Pressure ulcer of sacral region, stage 4: Secondary | ICD-10-CM | POA: Diagnosis not present

## 2015-12-06 DIAGNOSIS — L98419 Non-pressure chronic ulcer of buttock with unspecified severity: Secondary | ICD-10-CM | POA: Diagnosis not present

## 2015-12-12 MED ORDER — EPOETIN ALFA 2000 UNIT/ML IJ SOLN: 2000.0000 [IU] | INTRAMUSCULAR | Status: DC

## 2015-12-13 ENCOUNTER — Encounter (HOSPITAL_COMMUNITY)
Admission: RE | Admit: 2015-12-13 | Discharge: 2015-12-13 | Disposition: A | Discharge status: Home / Self Care | Payer: Medicare Other | Source: Ambulatory Visit | Attending: Nephrology | Admitting: Nephrology

## 2015-12-13 DIAGNOSIS — L89154 Pressure ulcer of sacral region, stage 4: Secondary | ICD-10-CM | POA: Diagnosis not present

## 2015-12-13 DIAGNOSIS — D509 Iron deficiency anemia, unspecified: Secondary | ICD-10-CM | POA: Diagnosis not present

## 2015-12-13 LAB — HEMOGLOBIN AND HEMATOCRIT, BLOOD
HCT: 29.8 % — ABNORMAL LOW (ref 39.0–52.0)
HEMOGLOBIN: 9.2 g/dL — AB (ref 13.0–17.0)

## 2015-12-13 MED ORDER — EPOETIN ALFA 2000 UNIT/ML IJ SOLN
2000.0000 [IU] | INTRAMUSCULAR | Status: DC
  Administered 2015-12-13: 2000 [IU] via SUBCUTANEOUS

## 2015-12-13 MED ORDER — EPOETIN ALFA 2000 UNIT/ML IJ SOLN
INTRAMUSCULAR | Status: AC
  Filled 2015-12-13: qty 1

## 2015-12-13 NOTE — Progress Notes (Signed)
Results for KWON, VERDUN (MRN [id number]) as of 12/13/2015 14:52  Ref. Range 12/13/2015 15:44  Hemoglobin Latest Ref Range: 13.0 - 17.0 g/dL 9.2 (L)  HCT Latest Ref Range: 39.0 - 52.0 % 29.8 (L)

## 2015-12-15 DIAGNOSIS — Z789 Other specified health status: Secondary | ICD-10-CM | POA: Diagnosis not present

## 2015-12-15 DIAGNOSIS — N184 Chronic kidney disease, stage 4 (severe): Secondary | ICD-10-CM | POA: Diagnosis not present

## 2015-12-21 DIAGNOSIS — J449 Chronic obstructive pulmonary disease, unspecified: Secondary | ICD-10-CM | POA: Diagnosis not present

## 2015-12-21 DIAGNOSIS — I10 Essential (primary) hypertension: Secondary | ICD-10-CM | POA: Diagnosis not present

## 2015-12-21 DIAGNOSIS — R0989 Other specified symptoms and signs involving the circulatory and respiratory systems: Secondary | ICD-10-CM | POA: Diagnosis not present

## 2015-12-21 DIAGNOSIS — R05 Cough: Secondary | ICD-10-CM | POA: Diagnosis not present

## 2015-12-21 DIAGNOSIS — R918 Other nonspecific abnormal finding of lung field: Secondary | ICD-10-CM | POA: Diagnosis not present

## 2015-12-22 DIAGNOSIS — Z789 Other specified health status: Secondary | ICD-10-CM | POA: Diagnosis not present

## 2015-12-25 DIAGNOSIS — L98419 Non-pressure chronic ulcer of buttock with unspecified severity: Secondary | ICD-10-CM | POA: Diagnosis not present

## 2015-12-25 DIAGNOSIS — L89154 Pressure ulcer of sacral region, stage 4: Secondary | ICD-10-CM | POA: Diagnosis not present

## 2015-12-26 DIAGNOSIS — N183 Chronic kidney disease, stage 3 (moderate): Secondary | ICD-10-CM | POA: Diagnosis not present

## 2015-12-26 DIAGNOSIS — I509 Heart failure, unspecified: Secondary | ICD-10-CM | POA: Diagnosis not present

## 2015-12-26 DIAGNOSIS — I10 Essential (primary) hypertension: Secondary | ICD-10-CM | POA: Diagnosis not present

## 2015-12-26 DIAGNOSIS — R809 Proteinuria, unspecified: Secondary | ICD-10-CM | POA: Diagnosis not present

## 2015-12-27 ENCOUNTER — Encounter (HOSPITAL_COMMUNITY)
Admission: RE | Admit: 2015-12-27 | Discharge: 2015-12-27 | Disposition: A | Discharge status: Home / Self Care | Payer: Medicare Other | Source: Ambulatory Visit | Attending: Nephrology | Admitting: Nephrology

## 2015-12-27 DIAGNOSIS — D509 Iron deficiency anemia, unspecified: Secondary | ICD-10-CM | POA: Insufficient documentation

## 2015-12-27 DIAGNOSIS — L89154 Pressure ulcer of sacral region, stage 4: Secondary | ICD-10-CM | POA: Diagnosis not present

## 2015-12-27 LAB — HEMOGLOBIN AND HEMATOCRIT, BLOOD
HEMATOCRIT: 31.5 % — AB (ref 39.0–52.0)
HEMOGLOBIN: 9.8 g/dL — AB (ref 13.0–17.0)

## 2015-12-27 MED ORDER — EPOETIN ALFA 2000 UNIT/ML IJ SOLN
INTRAMUSCULAR | Status: AC
  Filled 2015-12-27: qty 1

## 2015-12-27 MED ORDER — EPOETIN ALFA 2000 UNIT/ML IJ SOLN
2000.0000 [IU] | INTRAMUSCULAR | Status: DC
  Administered 2015-12-27: 2000 [IU] via SUBCUTANEOUS

## 2015-12-27 NOTE — Progress Notes (Signed)
Results for [name] Rice, [name] Rice (MRN [id number]) as of 12/27/2015 14:00  Ref. Range 12/27/2015 13:38  Hemoglobin Latest Ref Range: 13.0 - 17.0 g/dL 9.8 (L)  HCT Latest Ref Range: 39.0 - 52.0 % 31.5 (L)

## 2015-12-28 DIAGNOSIS — E559 Vitamin D deficiency, unspecified: Secondary | ICD-10-CM | POA: Diagnosis not present

## 2015-12-28 DIAGNOSIS — N184 Chronic kidney disease, stage 4 (severe): Secondary | ICD-10-CM | POA: Diagnosis not present

## 2015-12-29 DIAGNOSIS — R319 Hematuria, unspecified: Secondary | ICD-10-CM | POA: Diagnosis not present

## 2015-12-29 DIAGNOSIS — I2510 Atherosclerotic heart disease of native coronary artery without angina pectoris: Secondary | ICD-10-CM | POA: Diagnosis not present

## 2015-12-29 DIAGNOSIS — N184 Chronic kidney disease, stage 4 (severe): Secondary | ICD-10-CM | POA: Diagnosis not present

## 2015-12-29 DIAGNOSIS — N390 Urinary tract infection, site not specified: Secondary | ICD-10-CM | POA: Diagnosis not present

## 2015-12-29 DIAGNOSIS — J449 Chronic obstructive pulmonary disease, unspecified: Secondary | ICD-10-CM | POA: Diagnosis not present

## 2015-12-29 DIAGNOSIS — R05 Cough: Secondary | ICD-10-CM | POA: Diagnosis not present

## 2016-01-04 DIAGNOSIS — D649 Anemia, unspecified: Secondary | ICD-10-CM | POA: Diagnosis not present

## 2016-01-04 DIAGNOSIS — L89154 Pressure ulcer of sacral region, stage 4: Secondary | ICD-10-CM | POA: Diagnosis not present

## 2016-01-04 DIAGNOSIS — N189 Chronic kidney disease, unspecified: Secondary | ICD-10-CM | POA: Diagnosis not present

## 2016-01-04 DIAGNOSIS — J449 Chronic obstructive pulmonary disease, unspecified: Secondary | ICD-10-CM | POA: Diagnosis not present

## 2016-01-04 DIAGNOSIS — I10 Essential (primary) hypertension: Secondary | ICD-10-CM | POA: Diagnosis not present

## 2016-01-05 DIAGNOSIS — D649 Anemia, unspecified: Secondary | ICD-10-CM | POA: Diagnosis not present

## 2016-01-05 DIAGNOSIS — I509 Heart failure, unspecified: Secondary | ICD-10-CM | POA: Diagnosis not present

## 2016-01-10 ENCOUNTER — Encounter (HOSPITAL_COMMUNITY)
Admission: RE | Admit: 2016-01-10 | Discharge: 2016-01-10 | Disposition: A | Discharge status: Home / Self Care | Payer: Medicare Other | Source: Ambulatory Visit | Attending: Nephrology | Admitting: Nephrology

## 2016-01-10 DIAGNOSIS — D509 Iron deficiency anemia, unspecified: Secondary | ICD-10-CM | POA: Diagnosis not present

## 2016-01-10 LAB — POCT HEMOGLOBIN-HEMACUE: HEMOGLOBIN: 9.4 g/dL — AB (ref 13.0–17.0)

## 2016-01-10 MED ORDER — EPOETIN ALFA 2000 UNIT/ML IJ SOLN
2000.0000 [IU] | Freq: Once | INTRAMUSCULAR | Status: AC
  Administered 2016-01-10: 2000 [IU] via SUBCUTANEOUS

## 2016-01-10 MED ORDER — EPOETIN ALFA 2000 UNIT/ML IJ SOLN
INTRAMUSCULAR | Status: AC
  Filled 2016-01-10: qty 1

## 2016-01-10 NOTE — Progress Notes (Signed)
Results for FRANCK, TARMAN (MRN [id number]) as of 01/10/2016 14:09  Ref. Range 01/10/2016 13:51  Hemoglobin Latest Ref Range: 13.0 - 17.0 g/dL 9.4 (L)

## 2016-01-11 DIAGNOSIS — L89154 Pressure ulcer of sacral region, stage 4: Secondary | ICD-10-CM | POA: Diagnosis not present

## 2016-01-11 DIAGNOSIS — N183 Chronic kidney disease, stage 3 (moderate): Secondary | ICD-10-CM | POA: Diagnosis not present

## 2016-01-11 DIAGNOSIS — D649 Anemia, unspecified: Secondary | ICD-10-CM | POA: Diagnosis not present

## 2016-01-18 DIAGNOSIS — I4891 Unspecified atrial fibrillation: Secondary | ICD-10-CM | POA: Diagnosis not present

## 2016-01-18 DIAGNOSIS — L309 Dermatitis, unspecified: Secondary | ICD-10-CM | POA: Diagnosis not present

## 2016-01-18 DIAGNOSIS — I10 Essential (primary) hypertension: Secondary | ICD-10-CM | POA: Diagnosis not present

## 2016-01-18 DIAGNOSIS — D649 Anemia, unspecified: Secondary | ICD-10-CM | POA: Diagnosis not present

## 2016-01-24 ENCOUNTER — Encounter (HOSPITAL_COMMUNITY)
Admission: RE | Admit: 2016-01-24 | Discharge: 2016-01-24 | Disposition: A | Discharge status: Home / Self Care | Payer: Medicare Other | Source: Ambulatory Visit | Attending: Nephrology | Admitting: Nephrology

## 2016-01-24 DIAGNOSIS — D509 Iron deficiency anemia, unspecified: Secondary | ICD-10-CM | POA: Diagnosis not present

## 2016-01-24 LAB — POCT HEMOGLOBIN-HEMACUE: HEMOGLOBIN: 9.9 g/dL — AB (ref 13.0–17.0)

## 2016-01-24 MED ORDER — EPOETIN ALFA 2000 UNIT/ML IJ SOLN
INTRAMUSCULAR | Status: AC
  Filled 2016-01-24: qty 1

## 2016-01-24 MED ORDER — EPOETIN ALFA 2000 UNIT/ML IJ SOLN
2000.0000 [IU] | Freq: Once | INTRAMUSCULAR | Status: AC
  Administered 2016-01-24: 2000 [IU] via SUBCUTANEOUS

## 2016-01-25 DIAGNOSIS — L89154 Pressure ulcer of sacral region, stage 4: Secondary | ICD-10-CM | POA: Diagnosis not present

## 2016-01-25 NOTE — Progress Notes (Signed)
Results for QUEST, HANDRICH (MRN [id number]) as of 01/25/2016 15:51  Ref. Range 01/24/2016 13:39  Hemoglobin Latest Ref Range: 13.0 - 17.0 g/dL 9.9 (L)

## 2016-02-01 DIAGNOSIS — D649 Anemia, unspecified: Secondary | ICD-10-CM | POA: Diagnosis not present

## 2016-02-01 DIAGNOSIS — I10 Essential (primary) hypertension: Secondary | ICD-10-CM | POA: Diagnosis not present

## 2016-02-01 DIAGNOSIS — L89154 Pressure ulcer of sacral region, stage 4: Secondary | ICD-10-CM | POA: Diagnosis not present

## 2016-02-01 DIAGNOSIS — I4891 Unspecified atrial fibrillation: Secondary | ICD-10-CM | POA: Diagnosis not present

## 2016-02-02 DIAGNOSIS — Z789 Other specified health status: Secondary | ICD-10-CM | POA: Diagnosis not present

## 2016-02-02 DIAGNOSIS — D519 Vitamin B12 deficiency anemia, unspecified: Secondary | ICD-10-CM | POA: Diagnosis not present

## 2016-02-07 ENCOUNTER — Encounter (HOSPITAL_COMMUNITY)
Admission: RE | Admit: 2016-02-07 | Discharge: 2016-02-07 | Disposition: A | Discharge status: Home / Self Care | Payer: Medicare Other | Source: Ambulatory Visit | Attending: Nephrology | Admitting: Nephrology

## 2016-02-07 DIAGNOSIS — D509 Iron deficiency anemia, unspecified: Secondary | ICD-10-CM | POA: Diagnosis not present

## 2016-02-07 LAB — POCT HEMOGLOBIN-HEMACUE: HEMOGLOBIN: 10.5 g/dL — AB (ref 13.0–17.0)

## 2016-02-07 NOTE — Progress Notes (Signed)
hgb 10.5 therefore procrit withheld due to orders.

## 2016-02-10 DIAGNOSIS — L89154 Pressure ulcer of sacral region, stage 4: Secondary | ICD-10-CM | POA: Diagnosis not present

## 2016-02-11 DIAGNOSIS — D649 Anemia, unspecified: Secondary | ICD-10-CM | POA: Diagnosis not present

## 2016-02-21 ENCOUNTER — Encounter (HOSPITAL_COMMUNITY)
Admission: RE | Admit: 2016-02-21 | Discharge: 2016-02-21 | Disposition: A | Discharge status: Home / Self Care | Payer: Medicare Other | Source: Ambulatory Visit | Attending: Nephrology | Admitting: Nephrology

## 2016-02-21 DIAGNOSIS — D509 Iron deficiency anemia, unspecified: Secondary | ICD-10-CM | POA: Diagnosis not present

## 2016-02-21 LAB — POCT HEMOGLOBIN-HEMACUE: HEMOGLOBIN: 9.9 g/dL — AB (ref 13.0–17.0)

## 2016-02-21 MED ORDER — EPOETIN ALFA 2000 UNIT/ML IJ SOLN
INTRAMUSCULAR | Status: AC
  Filled 2016-02-21: qty 1

## 2016-02-21 MED ORDER — EPOETIN ALFA 2000 UNIT/ML IJ SOLN
2000.0000 [IU] | Freq: Once | INTRAMUSCULAR | Status: AC
  Administered 2016-02-21: 2000 [IU] via SUBCUTANEOUS

## 2016-02-21 NOTE — Progress Notes (Signed)
Results for SEGER, GOUKER (MRN [id number]) as of 02/21/2016 14:47  Ref. Range 02/21/2016 14:33  Hemoglobin Latest Ref Range: 13.0 - 17.0 g/dL 9.9 (L)

## 2016-02-29 DIAGNOSIS — I259 Chronic ischemic heart disease, unspecified: Secondary | ICD-10-CM | POA: Diagnosis not present

## 2016-02-29 DIAGNOSIS — I10 Essential (primary) hypertension: Secondary | ICD-10-CM | POA: Diagnosis not present

## 2016-02-29 DIAGNOSIS — I4891 Unspecified atrial fibrillation: Secondary | ICD-10-CM | POA: Diagnosis not present

## 2016-02-29 DIAGNOSIS — I2510 Atherosclerotic heart disease of native coronary artery without angina pectoris: Secondary | ICD-10-CM | POA: Diagnosis not present

## 2016-03-04 NOTE — Progress Notes (Signed)
Cardiology Office Note  Date: 03/05/2016   ID: [name] Rice, DOB 11-08-1935, MRN [id number]  PCP: Dwana Melena, MD  Primary Cardiologist: Nona Dell, MD   Chief Complaint  Patient presents with  . Diastolic heart failure    History of Present Illness: [name] Rice is a 80 y.o. male last seen in June. He presents for a routine follow-up visit. Here with family friend. He does not voice any specific complaints today, states that his appetite has been good, and that his legs have not had significant recurrent swelling. I did review his medications which are outlined below. Doses have been cut back on antihypertensives. He continues to reside in [facility].  Interval lab work is outlined below. He continues to follow with nephrology, renal function had improved somewhat at the last check. Has chronic anemia and is on Epogen injections.  Past Medical History:  Diagnosis Date  . Bradycardia   . CKD (chronic kidney disease), stage III    Has had temporary dialysis in the past  . COPD (chronic obstructive pulmonary disease) (HCC)   . Coronary atherosclerosis of native coronary artery    Nonobstructive  . DJD (degenerative joint disease)   . Essential hypertension, benign   . GERD (gastroesophageal reflux disease)   . HOH (hard of hearing)   . Lower extremity edema   . Mitral regurgitation    Moderate  . NICM (nonischemic cardiomyopathy) (HCC)    LVEF 55% 3/11  . Paroxysmal atrial fibrillation (HCC)   . Pulmonary hypertension   . Tricuspid regurgitation   . Ventricular dysfunction, right     Current Outpatient Prescriptions  Medication Sig Dispense Refill  . carvedilol (COREG) 6.25 MG tablet take 1 tablet by mouth twice a day with meals 60 tablet 6  . Cholecalciferol (VITAMIN D3) 3000 units TABS Take 2,000 Units by mouth daily.    Marland Kitchen FERREX 150 150 MG capsule Take 150 mg by mouth daily. Reported on 07/11/2015  0  . furosemide (LASIX) 20 MG tablet Take 20 mg by  mouth daily.  0  . hydrALAZINE (APRESOLINE) 25 MG tablet Take 12.5 mg by mouth. Bid ,hold if bp is less than 120/80    . Multiple Vitamins-Minerals (VISION-VITE PRESERVE PO) Take by mouth.    . SSD 1 % cream   0  . tiotropium (SPIRIVA) 18 MCG inhalation capsule Place 18 mcg into inhaler and inhale daily.     . vitamin C (ASCORBIC ACID) 500 MG tablet Take 500 mg by mouth 2 (two) times daily.    Marland Kitchen zinc sulfate 220 (50 Zn) MG capsule Take 220 mg by mouth daily.     No current facility-administered medications for this visit.    Allergies:  Patient has no known allergies.   Social History: The patient  reports that he quit smoking about 14 years ago. His smoking use included Cigarettes. He started smoking about 55 years ago. He has a 40.00 pack-year smoking history. He has never used smokeless tobacco. He reports that he drinks alcohol. He reports that he does not use drugs.   ROS:  Please see the history of present illness. Otherwise, complete review of systems is positive for hearing loss.  All other systems are reviewed and negative.   Physical Exam: VS:  BP (!) 118/56   Pulse (!) 59   Ht 5\' 8"  (1.727 m)   Wt 147 lb (66.7 kg)   SpO2 99%   BMI 22.35 kg/m , BMI Body  mass index is 22.35 kg/m.  Wt Readings from Last 3 Encounters:  03/05/16 147 lb (66.7 kg)  11/29/15 137 lb (62.1 kg)  10/20/15 137 lb 1.6 oz (62.2 kg)    Chronically ill-appearing male in no acute distress, in wheelchair today.  HEENT: Conjunctiva and lids are normal, oropharynx clear with poor dentition.  Neck: Supple, no elevated JVP.  Lungs: Diminished breath sounds with rhonchi. Cardiac: Regular rate and rhythm, indistinct PMI, no S3 gallop.  Abdomen: Nontender, bowel sounds present, no tenderness.  Extremities: 1+ bilateral lower leg edema, venous stasis and skin scaling.  ECG: I personally reviewed the tracing from 11/19/2014 which showed sinus rhythm with increased voltage and nonspecific ST-T  changes.   Recent Labwork: 02/21/2016: Hemoglobin 9.25 September 2015: BUN 25, creatinine 1.2, potassium 4.4, hemoglobin 7.4, platelets 321  Other Studies Reviewed Today:  Echocardiogram 09/13/2011: Study Conclusions  - Left ventricle: The cavity size was normal. Wall thickness was increased in a pattern of mild LVH. Systolic function was normal. The estimated ejection fraction was in the range of 55% to 60%. Wall motion was normal; there were no regional wall motion abnormalities. Doppler parameters are consistent with abnormal left ventricular relaxation (grade 1 diastolic dysfunction). - Aortic valve: Trileaflet; mildly calcified leaflets. Mild regurgitation. Mean gradient: 9mm Hg (S). - Mitral valve: Mild regurgitation. - Atrial septum: No defect or patent foramen ovale was identified. - Tricuspid valve: Mild regurgitation. Peak gradient: 39mm Hg (D). - Pulmonary arteries: Systolic pressure was mildly to moderately increased. - Pericardium, extracardiac: There was no pericardial effusion.  Assessment and Plan:  1. Chronic diastolic heart failure with normal LVEF by last assessment, history of nonischemic cardiomyopathy previously. Weight is up but his leg edema remains well controlled. No changes made to current diuretic regimen.  2. CKD stage 3, last creatinine had improved from 1.6 down to 1.2.  3. Anemia of chronic disease, on Epogen per nephrology.  Current medicines were reviewed with the patient today.  Disposition: Follow-up in 6 months.  Signed, Jonelle SidleSamuel G. Deaja Rizo, MD, [facility] 03/05/2016 12:16 PM    [facility] [address], [place], [place] [address] Phone: [phone]; Fax: [phone]

## 2016-03-05 ENCOUNTER — Ambulatory Visit (INDEPENDENT_AMBULATORY_CARE_PROVIDER_SITE_OTHER): Payer: Medicare Other | Admitting: Cardiology

## 2016-03-05 ENCOUNTER — Encounter: Payer: Self-pay | Admitting: Cardiology

## 2016-03-05 VITALS — BP 118/56 | HR 59 | Ht 68.0 in | Wt 147.0 lb

## 2016-03-05 DIAGNOSIS — I5032 Chronic diastolic (congestive) heart failure: Secondary | ICD-10-CM | POA: Diagnosis not present

## 2016-03-05 DIAGNOSIS — N183 Chronic kidney disease, stage 3 unspecified: Secondary | ICD-10-CM

## 2016-03-05 DIAGNOSIS — I429 Cardiomyopathy, unspecified: Secondary | ICD-10-CM

## 2016-03-05 NOTE — Patient Instructions (Signed)
Your physician wants you to follow-up in: 6 months with Dr.McDowell You will receive a reminder letter in the mail two months in advance. If you don't receive a letter, please call our office to schedule the follow-up appointment.     Your physician recommends that you continue on your current medications as directed. Please refer to the Current Medication list given to you today.     Thank you for choosing [facility] !        

## 2016-03-06 ENCOUNTER — Encounter (HOSPITAL_COMMUNITY)
Admission: RE | Admit: 2016-03-06 | Discharge: 2016-03-06 | Disposition: A | Discharge status: Home / Self Care | Payer: Medicare Other | Source: Ambulatory Visit | Attending: Nephrology | Admitting: Nephrology

## 2016-03-06 DIAGNOSIS — D509 Iron deficiency anemia, unspecified: Secondary | ICD-10-CM | POA: Diagnosis not present

## 2016-03-06 LAB — POCT HEMOGLOBIN-HEMACUE: Hemoglobin: 11.2 g/dL — ABNORMAL LOW (ref 13.0–17.0)

## 2016-03-06 NOTE — Progress Notes (Signed)
Results for [name] Rice, [name] Rice (MRN [id number]) as of 03/06/2016 13:25  Ref. Range 03/06/2016 13:13  Hemoglobin Latest Ref Range: 13.0 - 17.0 g/dL 53.9 (L)

## 2016-03-14 DIAGNOSIS — J449 Chronic obstructive pulmonary disease, unspecified: Secondary | ICD-10-CM | POA: Diagnosis not present

## 2016-03-14 DIAGNOSIS — I4891 Unspecified atrial fibrillation: Secondary | ICD-10-CM | POA: Diagnosis not present

## 2016-03-14 DIAGNOSIS — I10 Essential (primary) hypertension: Secondary | ICD-10-CM | POA: Diagnosis not present

## 2016-03-16 MED ORDER — EPOETIN ALFA 2000 UNIT/ML IJ SOLN
2000.0000 [IU] | Freq: Once | INTRAMUSCULAR | Status: AC
  Administered 2016-03-20: 2000 [IU] via SUBCUTANEOUS

## 2016-03-20 ENCOUNTER — Encounter (HOSPITAL_COMMUNITY)
Admission: RE | Admit: 2016-03-20 | Discharge: 2016-03-20 | Disposition: A | Discharge status: Home / Self Care | Payer: Medicare Other | Source: Ambulatory Visit | Attending: Nephrology | Admitting: Nephrology

## 2016-03-20 DIAGNOSIS — D509 Iron deficiency anemia, unspecified: Secondary | ICD-10-CM | POA: Diagnosis not present

## 2016-03-20 LAB — POCT HEMOGLOBIN-HEMACUE: Hemoglobin: 9.2 g/dL — ABNORMAL LOW (ref 13.0–17.0)

## 2016-03-20 MED ORDER — EPOETIN ALFA 2000 UNIT/ML IJ SOLN
INTRAMUSCULAR | Status: AC
  Filled 2016-03-20: qty 1

## 2016-03-20 MED ORDER — EPOETIN ALFA 2000 UNIT/ML IJ SOLN: 2000.0000 [IU] | Freq: Once | INTRAMUSCULAR | Status: DC

## 2016-03-20 NOTE — Progress Notes (Signed)
Results for CALAN, OTANEZ (MRN [id number]) as of 03/20/2016 13:34  Ref. Range 03/20/2016 13:04  Hemoglobin Latest Ref Range: 13.0 - 17.0 g/dL 9.2 (L)

## 2016-03-22 DIAGNOSIS — I509 Heart failure, unspecified: Secondary | ICD-10-CM | POA: Diagnosis not present

## 2016-03-22 DIAGNOSIS — J189 Pneumonia, unspecified organism: Secondary | ICD-10-CM | POA: Diagnosis not present

## 2016-03-22 DIAGNOSIS — L03119 Cellulitis of unspecified part of limb: Secondary | ICD-10-CM | POA: Diagnosis not present

## 2016-03-22 DIAGNOSIS — D649 Anemia, unspecified: Secondary | ICD-10-CM | POA: Diagnosis not present

## 2016-03-23 DIAGNOSIS — J069 Acute upper respiratory infection, unspecified: Secondary | ICD-10-CM | POA: Diagnosis not present

## 2016-03-23 DIAGNOSIS — J449 Chronic obstructive pulmonary disease, unspecified: Secondary | ICD-10-CM | POA: Diagnosis not present

## 2016-03-23 DIAGNOSIS — I2510 Atherosclerotic heart disease of native coronary artery without angina pectoris: Secondary | ICD-10-CM | POA: Diagnosis not present

## 2016-03-23 DIAGNOSIS — I259 Chronic ischemic heart disease, unspecified: Secondary | ICD-10-CM | POA: Diagnosis not present

## 2016-03-27 DIAGNOSIS — I10 Essential (primary) hypertension: Secondary | ICD-10-CM | POA: Diagnosis not present

## 2016-03-27 DIAGNOSIS — I509 Heart failure, unspecified: Secondary | ICD-10-CM | POA: Diagnosis not present

## 2016-03-27 DIAGNOSIS — D638 Anemia in other chronic diseases classified elsewhere: Secondary | ICD-10-CM | POA: Diagnosis not present

## 2016-03-27 DIAGNOSIS — R809 Proteinuria, unspecified: Secondary | ICD-10-CM | POA: Diagnosis not present

## 2016-03-27 DIAGNOSIS — N183 Chronic kidney disease, stage 3 (moderate): Secondary | ICD-10-CM | POA: Diagnosis not present

## 2016-04-03 ENCOUNTER — Encounter (HOSPITAL_COMMUNITY)
Admission: RE | Admit: 2016-04-03 | Discharge: 2016-04-03 | Disposition: A | Discharge status: Home / Self Care | Payer: Medicare Other | Source: Ambulatory Visit | Attending: Nephrology | Admitting: Nephrology

## 2016-04-03 DIAGNOSIS — D509 Iron deficiency anemia, unspecified: Secondary | ICD-10-CM | POA: Diagnosis not present

## 2016-04-03 LAB — POCT HEMOGLOBIN-HEMACUE: HEMOGLOBIN: 9.9 g/dL — AB (ref 13.0–17.0)

## 2016-04-03 MED ORDER — EPOETIN ALFA 2000 UNIT/ML IJ SOLN
2000.0000 [IU] | INTRAMUSCULAR | Status: DC
  Administered 2016-04-03: 2000 [IU] via SUBCUTANEOUS

## 2016-04-03 MED ORDER — EPOETIN ALFA 2000 UNIT/ML IJ SOLN
INTRAMUSCULAR | Status: AC
  Filled 2016-04-03: qty 1

## 2016-04-04 NOTE — Progress Notes (Signed)
Results for KAIRON, PELOSO (MRN [id number]) as of 04/04/2016 08:28  Ref. Range 04/03/2016 14:11  Hemoglobin Latest Ref Range: 13.0 - 17.0 g/dL 9.9 (L)

## 2016-04-11 DIAGNOSIS — I10 Essential (primary) hypertension: Secondary | ICD-10-CM | POA: Diagnosis not present

## 2016-04-11 DIAGNOSIS — I2510 Atherosclerotic heart disease of native coronary artery without angina pectoris: Secondary | ICD-10-CM | POA: Diagnosis not present

## 2016-04-11 DIAGNOSIS — J449 Chronic obstructive pulmonary disease, unspecified: Secondary | ICD-10-CM | POA: Diagnosis not present

## 2016-04-12 DIAGNOSIS — D509 Iron deficiency anemia, unspecified: Secondary | ICD-10-CM | POA: Diagnosis not present

## 2016-04-12 DIAGNOSIS — Z789 Other specified health status: Secondary | ICD-10-CM | POA: Diagnosis not present

## 2016-04-12 DIAGNOSIS — E119 Type 2 diabetes mellitus without complications: Secondary | ICD-10-CM | POA: Diagnosis not present

## 2016-04-12 DIAGNOSIS — I10 Essential (primary) hypertension: Secondary | ICD-10-CM | POA: Diagnosis not present

## 2016-04-17 ENCOUNTER — Encounter (HOSPITAL_COMMUNITY)
Admission: RE | Admit: 2016-04-17 | Discharge: 2016-04-17 | Disposition: A | Discharge status: Home / Self Care | Payer: Medicare Other | Source: Ambulatory Visit | Attending: Nephrology | Admitting: Nephrology

## 2016-04-17 DIAGNOSIS — D509 Iron deficiency anemia, unspecified: Secondary | ICD-10-CM | POA: Diagnosis not present

## 2016-04-17 LAB — POCT HEMOGLOBIN-HEMACUE: HEMOGLOBIN: 10.7 g/dL — AB (ref 13.0–17.0)

## 2016-04-17 NOTE — Final Progress Note (Signed)
Results for WOODY, ORY (MRN [id number]) as of 04/17/2016 13:59  Ref. Range 04/17/2016 13:49  Hemoglobin Latest Ref Range: 13.0 - 17.0 g/dL 85.6 (L)

## 2016-04-19 DIAGNOSIS — I4891 Unspecified atrial fibrillation: Secondary | ICD-10-CM | POA: Diagnosis not present

## 2016-04-19 DIAGNOSIS — Z7409 Other reduced mobility: Secondary | ICD-10-CM | POA: Diagnosis not present

## 2016-04-19 DIAGNOSIS — J449 Chronic obstructive pulmonary disease, unspecified: Secondary | ICD-10-CM | POA: Diagnosis not present

## 2016-04-19 DIAGNOSIS — I259 Chronic ischemic heart disease, unspecified: Secondary | ICD-10-CM | POA: Diagnosis not present

## 2016-04-23 ENCOUNTER — Ambulatory Visit (INDEPENDENT_AMBULATORY_CARE_PROVIDER_SITE_OTHER): Payer: Medicare Other | Admitting: Internal Medicine

## 2016-05-01 ENCOUNTER — Encounter (HOSPITAL_COMMUNITY)
Admission: RE | Admit: 2016-05-01 | Discharge: 2016-05-01 | Disposition: A | Discharge status: Home / Self Care | Payer: Medicare Other | Source: Ambulatory Visit | Attending: Nephrology | Admitting: Nephrology

## 2016-05-01 ENCOUNTER — Encounter (HOSPITAL_COMMUNITY): Payer: Self-pay

## 2016-05-01 DIAGNOSIS — D509 Iron deficiency anemia, unspecified: Secondary | ICD-10-CM | POA: Insufficient documentation

## 2016-05-01 LAB — POCT HEMOGLOBIN-HEMACUE: HEMOGLOBIN: 10 g/dL — AB (ref 13.0–17.0)

## 2016-05-01 NOTE — Progress Notes (Signed)
Results for JAMEAR, KANIECKI (MRN [id number]) as of 05/01/2016 14:39 No Procrit given today, next appointment 05/15/2016 @ 1400.   Ref. Range 05/01/2016 13:55  Hemoglobin Latest Ref Range: 13.0 - 17.0 g/dL 76.8 (L)

## 2016-05-03 ENCOUNTER — Encounter (INDEPENDENT_AMBULATORY_CARE_PROVIDER_SITE_OTHER): Payer: Self-pay | Admitting: Internal Medicine

## 2016-05-03 ENCOUNTER — Ambulatory Visit (INDEPENDENT_AMBULATORY_CARE_PROVIDER_SITE_OTHER): Payer: Medicare Other | Admitting: Internal Medicine

## 2016-05-03 VITALS — BP 140/80 | HR 72 | Temp 98.1°F | Ht 68.0 in

## 2016-05-03 DIAGNOSIS — D508 Other iron deficiency anemias: Secondary | ICD-10-CM

## 2016-05-03 NOTE — Patient Instructions (Signed)
OV in 1 year. Patient has declined colonoscopy.

## 2016-05-03 NOTE — Progress Notes (Addendum)
Subjective:    Patient ID: [name] Rice, male    DOB: [birth year], 81 y.o.   MRN: [id number]  HPI Here today for f/u. Last seen in August for anemia. Resident of [facility].  Has has anemia as far back as 2013. W/C bound.  Has never undergone a colonoscopy in the past. No family hx of colon cancer. Has Hemoglobin 8.8.  05/01/2016 Hemoglobin was 10.0.  Stool was negative for blood in office last weeks.  He usually has a BM daily. No melena or BRRB. He receives Procrit 2000units at [facility].  Resident of [facility] x 7 yrs.  His appetite for the most part is good.  Unable to get weight. He is w/c bound.    Hx of severe arthritis. Hx significant for CAD, atrial fib, cardiomyopathy, CKD. Echo 09/03/2011 EF 55-60  CBC Latest Ref Rng & Units 05/01/2016 04/17/2016 04/03/2016  WBC 4.0 - 10.5 K/uL - - -  Hemoglobin 13.0 - 17.0 g/dL 10.0(L) 10.7(L) 9.9(L)  Hematocrit 39.0 - 52.0 % - - -  Platelets 150 - 400 K/uL - - -  04/13/2015 Hemoglobin  9.1 10/18/2015 Hemoglobin 8.8 10/06/2015 H and H 7.4 and 21.9 10/23/2014 Ferritin 89, iron 46, Iron sat 17, TIBC 274.  Review of Systems Past Medical History:  Diagnosis Date  . Bradycardia   . CKD (chronic kidney disease), stage III    Has had temporary dialysis in the past  . COPD (chronic obstructive pulmonary disease) (HCC)   . Coronary atherosclerosis of native coronary artery    Nonobstructive  . DJD (degenerative joint disease)   . Essential hypertension, benign   . GERD (gastroesophageal reflux disease)   . HOH (hard of hearing)   . Lower extremity edema   . Mitral regurgitation    Moderate  . NICM (nonischemic cardiomyopathy) (HCC)    LVEF 55% 3/11  . Paroxysmal atrial fibrillation (HCC)   . Pulmonary hypertension   . Tricuspid regurgitation   . Ventricular dysfunction, right     Past Surgical History:  Procedure Laterality Date  . CATARACT EXTRACTION W/ INTRAOCULAR LENS IMPLANT     right  . DIALYSIS FISTULA CREATION     left forearm  .  TOTAL HIP ARTHROPLASTY  ~ 2005   left    No Known Allergies  Current Outpatient Prescriptions on File Prior to Visit  Medication Sig Dispense Refill  . carvedilol (COREG) 6.25 MG tablet take 1 tablet by mouth twice a day with meals 60 tablet 6  . Cholecalciferol (VITAMIN D3) 3000 units TABS Take 2,000 Units by mouth daily.    Marland Kitchen FERREX 150 150 MG capsule Take 150 mg by mouth daily. Reported on 07/11/2015  0  . furosemide (LASIX) 20 MG tablet Take 20 mg by mouth daily.  0  . tiotropium (SPIRIVA) 18 MCG inhalation capsule Place 18 mcg into inhaler and inhale daily.     . hydrALAZINE (APRESOLINE) 25 MG tablet Take 12.5 mg by mouth. Bid ,hold if bp is less than 120/80    . Multiple Vitamins-Minerals (VISION-VITE PRESERVE PO) Take by mouth.    . SSD 1 % cream   0  . vitamin C (ASCORBIC ACID) 500 MG tablet Take 500 mg by mouth 2 (two) times daily.    Marland Kitchen zinc sulfate 220 (50 Zn) MG capsule Take 220 mg by mouth daily.     No current facility-administered medications on file prior to visit.        Objective:   Physical Exam Blood  pressure 140/80, pulse 72, temperature 98.1 F (36.7 C), height 5\' 8"  (1.727 m). Alert and oriented. Skin warm and dry. Oral mucosa is moist.   . Sclera anicteric, conjunctivae is pink. Thyroid not enlarged. No cervical lymphadenopathy. Lungs clear. Heart regular rate and rhythm.  Abdomen is soft. Bowel sounds are positive. No hepatomegaly. No abdominal masses felt. No tenderness. W/C bound.  Examined from W/C.       Assessment & Plan:  Anemia. Receives Procrit 2000 units as needed at the [facility]. He has declined colonoscopy.  OV in one year OV in 1year

## 2016-05-09 DIAGNOSIS — K219 Gastro-esophageal reflux disease without esophagitis: Secondary | ICD-10-CM | POA: Diagnosis not present

## 2016-05-09 DIAGNOSIS — I10 Essential (primary) hypertension: Secondary | ICD-10-CM | POA: Diagnosis not present

## 2016-05-09 DIAGNOSIS — J449 Chronic obstructive pulmonary disease, unspecified: Secondary | ICD-10-CM | POA: Diagnosis not present

## 2016-05-09 DIAGNOSIS — I2510 Atherosclerotic heart disease of native coronary artery without angina pectoris: Secondary | ICD-10-CM | POA: Diagnosis not present

## 2016-05-15 ENCOUNTER — Encounter (HOSPITAL_COMMUNITY)
Admission: RE | Admit: 2016-05-15 | Discharge: 2016-05-15 | Disposition: A | Discharge status: Home / Self Care | Payer: Medicare Other | Source: Ambulatory Visit | Attending: Nephrology | Admitting: Nephrology

## 2016-05-15 DIAGNOSIS — D509 Iron deficiency anemia, unspecified: Secondary | ICD-10-CM | POA: Diagnosis not present

## 2016-05-15 LAB — HEMOGLOBIN AND HEMATOCRIT, BLOOD
HEMATOCRIT: 35.6 % — AB (ref 39.0–52.0)
HEMOGLOBIN: 11.1 g/dL — AB (ref 13.0–17.0)

## 2016-05-15 NOTE — Progress Notes (Signed)
Results for [name], [name] (MRN [id number]) as of 05/15/2016 14:19  Ref. Range 05/15/2016 14:03  Hemoglobin Latest Ref Range: 13.0 - 17.0 g/dL 86.7 (L)  HCT Latest Ref Range: 39.0 - 52.0 % 35.6 (L)

## 2016-05-24 DIAGNOSIS — Z7409 Other reduced mobility: Secondary | ICD-10-CM | POA: Diagnosis not present

## 2016-05-24 DIAGNOSIS — I2510 Atherosclerotic heart disease of native coronary artery without angina pectoris: Secondary | ICD-10-CM | POA: Diagnosis not present

## 2016-05-24 DIAGNOSIS — I259 Chronic ischemic heart disease, unspecified: Secondary | ICD-10-CM | POA: Diagnosis not present

## 2016-05-24 DIAGNOSIS — J449 Chronic obstructive pulmonary disease, unspecified: Secondary | ICD-10-CM | POA: Diagnosis not present

## 2016-05-29 ENCOUNTER — Encounter (HOSPITAL_COMMUNITY)
Admission: RE | Admit: 2016-05-29 | Discharge: 2016-05-29 | Disposition: A | Discharge status: Home / Self Care | Payer: Medicare Other | Source: Ambulatory Visit | Attending: Nephrology | Admitting: Nephrology

## 2016-05-29 DIAGNOSIS — D509 Iron deficiency anemia, unspecified: Secondary | ICD-10-CM | POA: Insufficient documentation

## 2016-05-29 LAB — POCT HEMOGLOBIN-HEMACUE: HEMOGLOBIN: 10.6 g/dL — AB (ref 13.0–17.0)

## 2016-05-29 NOTE — Progress Notes (Signed)
Results for VERGIL, LOATMAN (MRN [id number]) as of 05/29/2016 14:19  Ref. Range 05/29/2016 14:00  Hemoglobin Latest Ref Range: 13.0 - 17.0 g/dL 89.1 (L)   No injection indicated per MD order. Next appt. 06/12/16.

## 2016-06-12 ENCOUNTER — Encounter (HOSPITAL_COMMUNITY)
Admission: RE | Admit: 2016-06-12 | Discharge: 2016-06-12 | Disposition: A | Discharge status: Home / Self Care | Payer: Medicare Other | Source: Ambulatory Visit | Attending: Nephrology | Admitting: Nephrology

## 2016-06-12 DIAGNOSIS — D509 Iron deficiency anemia, unspecified: Secondary | ICD-10-CM | POA: Diagnosis not present

## 2016-06-12 LAB — POCT HEMOGLOBIN-HEMACUE: HEMOGLOBIN: 11.5 g/dL — AB (ref 13.0–17.0)

## 2016-06-12 NOTE — Progress Notes (Signed)
Results for CORAL, KRAMMES (MRN [id number]) as of 06/12/2016 14:14  Ref. Range 06/12/2016 13:56  Hemoglobin Latest Ref Range: 13.0 - 17.0 g/dL 00.7 (L)

## 2016-06-26 ENCOUNTER — Encounter (HOSPITAL_COMMUNITY)
Admission: RE | Admit: 2016-06-26 | Discharge: 2016-06-26 | Disposition: A | Discharge status: Home / Self Care | Payer: Medicare Other | Source: Ambulatory Visit | Attending: Nephrology | Admitting: Nephrology

## 2016-06-26 ENCOUNTER — Encounter (HOSPITAL_COMMUNITY): Payer: Self-pay

## 2016-06-26 DIAGNOSIS — R809 Proteinuria, unspecified: Secondary | ICD-10-CM | POA: Diagnosis not present

## 2016-06-26 DIAGNOSIS — N250 Renal osteodystrophy: Secondary | ICD-10-CM | POA: Diagnosis not present

## 2016-06-26 DIAGNOSIS — D509 Iron deficiency anemia, unspecified: Secondary | ICD-10-CM | POA: Insufficient documentation

## 2016-06-26 DIAGNOSIS — I509 Heart failure, unspecified: Secondary | ICD-10-CM | POA: Diagnosis not present

## 2016-06-26 DIAGNOSIS — N183 Chronic kidney disease, stage 3 (moderate): Secondary | ICD-10-CM | POA: Diagnosis not present

## 2016-06-26 DIAGNOSIS — I10 Essential (primary) hypertension: Secondary | ICD-10-CM | POA: Diagnosis not present

## 2016-06-26 LAB — POCT HEMOGLOBIN-HEMACUE: HEMOGLOBIN: 10.7 g/dL — AB (ref 13.0–17.0)

## 2016-06-27 NOTE — Progress Notes (Signed)
Results for [name] Rice, [name] Rice (MRN [id number]) as of 06/27/2016 15:58  Ref. Range 06/26/2016 13:39  Hemoglobin Latest Ref Range: 13.0 - 17.0 g/dL 96.2 (L)

## 2016-06-29 DIAGNOSIS — I259 Chronic ischemic heart disease, unspecified: Secondary | ICD-10-CM | POA: Diagnosis not present

## 2016-06-29 DIAGNOSIS — Z7409 Other reduced mobility: Secondary | ICD-10-CM | POA: Diagnosis not present

## 2016-06-29 DIAGNOSIS — J449 Chronic obstructive pulmonary disease, unspecified: Secondary | ICD-10-CM | POA: Diagnosis not present

## 2016-07-10 ENCOUNTER — Encounter (HOSPITAL_COMMUNITY)
Admission: RE | Admit: 2016-07-10 | Discharge: 2016-07-10 | Disposition: A | Discharge status: Home / Self Care | Payer: Medicare Other | Source: Ambulatory Visit | Attending: Nephrology | Admitting: Nephrology

## 2016-07-10 DIAGNOSIS — D509 Iron deficiency anemia, unspecified: Secondary | ICD-10-CM | POA: Diagnosis not present

## 2016-07-10 LAB — POCT HEMOGLOBIN-HEMACUE: Hemoglobin: 11.6 g/dL — ABNORMAL LOW (ref 13.0–17.0)

## 2016-07-11 DIAGNOSIS — N183 Chronic kidney disease, stage 3 (moderate): Secondary | ICD-10-CM | POA: Diagnosis not present

## 2016-07-11 DIAGNOSIS — D649 Anemia, unspecified: Secondary | ICD-10-CM | POA: Diagnosis not present

## 2016-07-11 NOTE — Progress Notes (Signed)
Results for [name] Rice, [name] Rice (MRN [id number]) as of 07/11/2016 10:17  Ref. Range 07/10/2016 13:44  Hemoglobin Latest Ref Range: 13.0 - 17.0 g/dL 55.3 (L)

## 2016-07-16 DIAGNOSIS — D649 Anemia, unspecified: Secondary | ICD-10-CM | POA: Diagnosis not present

## 2016-07-16 DIAGNOSIS — R262 Difficulty in walking, not elsewhere classified: Secondary | ICD-10-CM | POA: Diagnosis not present

## 2016-07-16 DIAGNOSIS — I4891 Unspecified atrial fibrillation: Secondary | ICD-10-CM | POA: Diagnosis not present

## 2016-07-16 DIAGNOSIS — M1990 Unspecified osteoarthritis, unspecified site: Secondary | ICD-10-CM | POA: Diagnosis not present

## 2016-07-16 DIAGNOSIS — N184 Chronic kidney disease, stage 4 (severe): Secondary | ICD-10-CM | POA: Diagnosis not present

## 2016-07-16 DIAGNOSIS — E785 Hyperlipidemia, unspecified: Secondary | ICD-10-CM | POA: Diagnosis not present

## 2016-07-17 DIAGNOSIS — N184 Chronic kidney disease, stage 4 (severe): Secondary | ICD-10-CM | POA: Diagnosis not present

## 2016-07-17 DIAGNOSIS — R262 Difficulty in walking, not elsewhere classified: Secondary | ICD-10-CM | POA: Diagnosis not present

## 2016-07-18 DIAGNOSIS — R262 Difficulty in walking, not elsewhere classified: Secondary | ICD-10-CM | POA: Diagnosis not present

## 2016-07-18 DIAGNOSIS — N184 Chronic kidney disease, stage 4 (severe): Secondary | ICD-10-CM | POA: Diagnosis not present

## 2016-07-20 DIAGNOSIS — R262 Difficulty in walking, not elsewhere classified: Secondary | ICD-10-CM | POA: Diagnosis not present

## 2016-07-20 DIAGNOSIS — N184 Chronic kidney disease, stage 4 (severe): Secondary | ICD-10-CM | POA: Diagnosis not present

## 2016-07-23 DIAGNOSIS — R262 Difficulty in walking, not elsewhere classified: Secondary | ICD-10-CM | POA: Diagnosis not present

## 2016-07-23 DIAGNOSIS — N184 Chronic kidney disease, stage 4 (severe): Secondary | ICD-10-CM | POA: Diagnosis not present

## 2016-07-24 ENCOUNTER — Encounter (HOSPITAL_COMMUNITY)
Admission: RE | Admit: 2016-07-24 | Discharge: 2016-07-24 | Disposition: A | Discharge status: Home / Self Care | Payer: Medicare Other | Source: Ambulatory Visit | Attending: Nephrology | Admitting: Nephrology

## 2016-07-24 DIAGNOSIS — R262 Difficulty in walking, not elsewhere classified: Secondary | ICD-10-CM | POA: Diagnosis not present

## 2016-07-24 DIAGNOSIS — D631 Anemia in chronic kidney disease: Secondary | ICD-10-CM | POA: Insufficient documentation

## 2016-07-24 DIAGNOSIS — N184 Chronic kidney disease, stage 4 (severe): Secondary | ICD-10-CM | POA: Insufficient documentation

## 2016-07-24 LAB — POCT HEMOGLOBIN-HEMACUE: Hemoglobin: 10.3 g/dL — ABNORMAL LOW (ref 13.0–17.0)

## 2016-07-25 DIAGNOSIS — R262 Difficulty in walking, not elsewhere classified: Secondary | ICD-10-CM | POA: Diagnosis not present

## 2016-07-25 DIAGNOSIS — N184 Chronic kidney disease, stage 4 (severe): Secondary | ICD-10-CM | POA: Diagnosis not present

## 2016-07-26 DIAGNOSIS — R262 Difficulty in walking, not elsewhere classified: Secondary | ICD-10-CM | POA: Diagnosis not present

## 2016-07-26 DIAGNOSIS — N184 Chronic kidney disease, stage 4 (severe): Secondary | ICD-10-CM | POA: Diagnosis not present

## 2016-07-27 DIAGNOSIS — R262 Difficulty in walking, not elsewhere classified: Secondary | ICD-10-CM | POA: Diagnosis not present

## 2016-07-27 DIAGNOSIS — N184 Chronic kidney disease, stage 4 (severe): Secondary | ICD-10-CM | POA: Diagnosis not present

## 2016-07-30 DIAGNOSIS — N184 Chronic kidney disease, stage 4 (severe): Secondary | ICD-10-CM | POA: Diagnosis not present

## 2016-07-30 DIAGNOSIS — R262 Difficulty in walking, not elsewhere classified: Secondary | ICD-10-CM | POA: Diagnosis not present

## 2016-07-31 DIAGNOSIS — R262 Difficulty in walking, not elsewhere classified: Secondary | ICD-10-CM | POA: Diagnosis not present

## 2016-07-31 DIAGNOSIS — N184 Chronic kidney disease, stage 4 (severe): Secondary | ICD-10-CM | POA: Diagnosis not present

## 2016-08-01 DIAGNOSIS — N184 Chronic kidney disease, stage 4 (severe): Secondary | ICD-10-CM | POA: Diagnosis not present

## 2016-08-01 DIAGNOSIS — R262 Difficulty in walking, not elsewhere classified: Secondary | ICD-10-CM | POA: Diagnosis not present

## 2016-08-02 DIAGNOSIS — N184 Chronic kidney disease, stage 4 (severe): Secondary | ICD-10-CM | POA: Diagnosis not present

## 2016-08-02 DIAGNOSIS — R262 Difficulty in walking, not elsewhere classified: Secondary | ICD-10-CM | POA: Diagnosis not present

## 2016-08-03 DIAGNOSIS — R262 Difficulty in walking, not elsewhere classified: Secondary | ICD-10-CM | POA: Diagnosis not present

## 2016-08-03 DIAGNOSIS — N184 Chronic kidney disease, stage 4 (severe): Secondary | ICD-10-CM | POA: Diagnosis not present

## 2016-08-06 DIAGNOSIS — D649 Anemia, unspecified: Secondary | ICD-10-CM | POA: Diagnosis not present

## 2016-08-06 DIAGNOSIS — N184 Chronic kidney disease, stage 4 (severe): Secondary | ICD-10-CM | POA: Diagnosis not present

## 2016-08-06 DIAGNOSIS — E785 Hyperlipidemia, unspecified: Secondary | ICD-10-CM | POA: Diagnosis not present

## 2016-08-06 DIAGNOSIS — R262 Difficulty in walking, not elsewhere classified: Secondary | ICD-10-CM | POA: Diagnosis not present

## 2016-08-06 DIAGNOSIS — M1990 Unspecified osteoarthritis, unspecified site: Secondary | ICD-10-CM | POA: Diagnosis not present

## 2016-08-06 DIAGNOSIS — I4891 Unspecified atrial fibrillation: Secondary | ICD-10-CM | POA: Diagnosis not present

## 2016-08-07 ENCOUNTER — Encounter (HOSPITAL_COMMUNITY)
Admission: RE | Admit: 2016-08-07 | Discharge: 2016-08-07 | Disposition: A | Discharge status: Home / Self Care | Payer: Medicare Other | Source: Ambulatory Visit | Attending: Nephrology | Admitting: Nephrology

## 2016-08-07 ENCOUNTER — Encounter (HOSPITAL_COMMUNITY): Payer: Self-pay

## 2016-08-07 DIAGNOSIS — N184 Chronic kidney disease, stage 4 (severe): Secondary | ICD-10-CM | POA: Diagnosis not present

## 2016-08-07 DIAGNOSIS — D631 Anemia in chronic kidney disease: Secondary | ICD-10-CM | POA: Diagnosis not present

## 2016-08-07 DIAGNOSIS — R262 Difficulty in walking, not elsewhere classified: Secondary | ICD-10-CM | POA: Diagnosis not present

## 2016-08-07 LAB — POCT HEMOGLOBIN-HEMACUE: Hemoglobin: 12.8 g/dL — ABNORMAL LOW (ref 13.0–17.0)

## 2016-08-08 DIAGNOSIS — N184 Chronic kidney disease, stage 4 (severe): Secondary | ICD-10-CM | POA: Diagnosis not present

## 2016-08-08 DIAGNOSIS — R262 Difficulty in walking, not elsewhere classified: Secondary | ICD-10-CM | POA: Diagnosis not present

## 2016-08-08 NOTE — Progress Notes (Signed)
Results for ALCEE, PEBBLES (MRN [id number]) as of 08/08/2016 07:43  Ref. Range 08/07/2016 13:09  Hemoglobin Latest Ref Range: 13.0 - 17.0 g/dL 40.9 (L)

## 2016-08-09 DIAGNOSIS — N184 Chronic kidney disease, stage 4 (severe): Secondary | ICD-10-CM | POA: Diagnosis not present

## 2016-08-09 DIAGNOSIS — R262 Difficulty in walking, not elsewhere classified: Secondary | ICD-10-CM | POA: Diagnosis not present

## 2016-08-10 DIAGNOSIS — R262 Difficulty in walking, not elsewhere classified: Secondary | ICD-10-CM | POA: Diagnosis not present

## 2016-08-10 DIAGNOSIS — N184 Chronic kidney disease, stage 4 (severe): Secondary | ICD-10-CM | POA: Diagnosis not present

## 2016-08-12 DIAGNOSIS — N184 Chronic kidney disease, stage 4 (severe): Secondary | ICD-10-CM | POA: Diagnosis not present

## 2016-08-12 DIAGNOSIS — R262 Difficulty in walking, not elsewhere classified: Secondary | ICD-10-CM | POA: Diagnosis not present

## 2016-08-13 DIAGNOSIS — N184 Chronic kidney disease, stage 4 (severe): Secondary | ICD-10-CM | POA: Diagnosis not present

## 2016-08-13 DIAGNOSIS — R262 Difficulty in walking, not elsewhere classified: Secondary | ICD-10-CM | POA: Diagnosis not present

## 2016-08-14 DIAGNOSIS — E039 Hypothyroidism, unspecified: Secondary | ICD-10-CM | POA: Diagnosis not present

## 2016-08-14 DIAGNOSIS — D518 Other vitamin B12 deficiency anemias: Secondary | ICD-10-CM | POA: Diagnosis not present

## 2016-08-14 DIAGNOSIS — N183 Chronic kidney disease, stage 3 (moderate): Secondary | ICD-10-CM | POA: Diagnosis not present

## 2016-08-14 DIAGNOSIS — N184 Chronic kidney disease, stage 4 (severe): Secondary | ICD-10-CM | POA: Diagnosis not present

## 2016-08-14 DIAGNOSIS — E785 Hyperlipidemia, unspecified: Secondary | ICD-10-CM | POA: Diagnosis not present

## 2016-08-14 DIAGNOSIS — I509 Heart failure, unspecified: Secondary | ICD-10-CM | POA: Diagnosis not present

## 2016-08-14 DIAGNOSIS — E119 Type 2 diabetes mellitus without complications: Secondary | ICD-10-CM | POA: Diagnosis not present

## 2016-08-14 DIAGNOSIS — E559 Vitamin D deficiency, unspecified: Secondary | ICD-10-CM | POA: Diagnosis not present

## 2016-08-14 DIAGNOSIS — D649 Anemia, unspecified: Secondary | ICD-10-CM | POA: Diagnosis not present

## 2016-08-14 DIAGNOSIS — R262 Difficulty in walking, not elsewhere classified: Secondary | ICD-10-CM | POA: Diagnosis not present

## 2016-08-15 DIAGNOSIS — R262 Difficulty in walking, not elsewhere classified: Secondary | ICD-10-CM | POA: Diagnosis not present

## 2016-08-15 DIAGNOSIS — N184 Chronic kidney disease, stage 4 (severe): Secondary | ICD-10-CM | POA: Diagnosis not present

## 2016-08-16 DIAGNOSIS — R262 Difficulty in walking, not elsewhere classified: Secondary | ICD-10-CM | POA: Diagnosis not present

## 2016-08-16 DIAGNOSIS — N184 Chronic kidney disease, stage 4 (severe): Secondary | ICD-10-CM | POA: Diagnosis not present

## 2016-08-17 DIAGNOSIS — M6281 Muscle weakness (generalized): Secondary | ICD-10-CM | POA: Diagnosis not present

## 2016-08-17 DIAGNOSIS — M19042 Primary osteoarthritis, left hand: Secondary | ICD-10-CM | POA: Diagnosis not present

## 2016-08-17 DIAGNOSIS — R262 Difficulty in walking, not elsewhere classified: Secondary | ICD-10-CM | POA: Diagnosis not present

## 2016-08-17 DIAGNOSIS — N184 Chronic kidney disease, stage 4 (severe): Secondary | ICD-10-CM | POA: Diagnosis not present

## 2016-08-20 DIAGNOSIS — M6281 Muscle weakness (generalized): Secondary | ICD-10-CM | POA: Diagnosis not present

## 2016-08-20 DIAGNOSIS — R262 Difficulty in walking, not elsewhere classified: Secondary | ICD-10-CM | POA: Diagnosis not present

## 2016-08-20 DIAGNOSIS — N184 Chronic kidney disease, stage 4 (severe): Secondary | ICD-10-CM | POA: Diagnosis not present

## 2016-08-20 DIAGNOSIS — M19042 Primary osteoarthritis, left hand: Secondary | ICD-10-CM | POA: Diagnosis not present

## 2016-08-21 ENCOUNTER — Encounter (HOSPITAL_COMMUNITY)
Admission: RE | Admit: 2016-08-21 | Discharge: 2016-08-21 | Disposition: A | Discharge status: Home / Self Care | Payer: Medicare Other | Source: Ambulatory Visit | Attending: Nephrology | Admitting: Nephrology

## 2016-08-21 ENCOUNTER — Encounter (HOSPITAL_COMMUNITY): Payer: Self-pay

## 2016-08-21 DIAGNOSIS — R262 Difficulty in walking, not elsewhere classified: Secondary | ICD-10-CM | POA: Diagnosis not present

## 2016-08-21 DIAGNOSIS — D649 Anemia, unspecified: Secondary | ICD-10-CM | POA: Insufficient documentation

## 2016-08-21 DIAGNOSIS — M6281 Muscle weakness (generalized): Secondary | ICD-10-CM | POA: Diagnosis not present

## 2016-08-21 DIAGNOSIS — M19042 Primary osteoarthritis, left hand: Secondary | ICD-10-CM | POA: Diagnosis not present

## 2016-08-21 DIAGNOSIS — N184 Chronic kidney disease, stage 4 (severe): Secondary | ICD-10-CM | POA: Diagnosis not present

## 2016-08-21 LAB — POCT HEMOGLOBIN-HEMACUE: Hemoglobin: 11.2 g/dL — ABNORMAL LOW (ref 13.0–17.0)

## 2016-08-21 MED ORDER — EPOETIN ALFA 2000 UNIT/ML IJ SOLN: 2000.0000 [IU] | INTRAMUSCULAR | Status: DC

## 2016-08-21 NOTE — Progress Notes (Signed)
Results for KAIO, PIROLLI (MRN [id number]) as of 08/21/2016 13:35  Ref. Range 08/21/2016 13:13  Hemoglobin Latest Ref Range: 13.0 - 17.0 g/dL 81.8 (L)

## 2016-08-22 ENCOUNTER — Ambulatory Visit: Payer: Medicare Other | Admitting: Cardiology

## 2016-08-22 DIAGNOSIS — M19042 Primary osteoarthritis, left hand: Secondary | ICD-10-CM | POA: Diagnosis not present

## 2016-08-22 DIAGNOSIS — R262 Difficulty in walking, not elsewhere classified: Secondary | ICD-10-CM | POA: Diagnosis not present

## 2016-08-22 DIAGNOSIS — N184 Chronic kidney disease, stage 4 (severe): Secondary | ICD-10-CM | POA: Diagnosis not present

## 2016-08-22 DIAGNOSIS — M6281 Muscle weakness (generalized): Secondary | ICD-10-CM | POA: Diagnosis not present

## 2016-08-23 DIAGNOSIS — M19042 Primary osteoarthritis, left hand: Secondary | ICD-10-CM | POA: Diagnosis not present

## 2016-08-23 DIAGNOSIS — R262 Difficulty in walking, not elsewhere classified: Secondary | ICD-10-CM | POA: Diagnosis not present

## 2016-08-23 DIAGNOSIS — N184 Chronic kidney disease, stage 4 (severe): Secondary | ICD-10-CM | POA: Diagnosis not present

## 2016-08-23 DIAGNOSIS — M6281 Muscle weakness (generalized): Secondary | ICD-10-CM | POA: Diagnosis not present

## 2016-08-24 DIAGNOSIS — R262 Difficulty in walking, not elsewhere classified: Secondary | ICD-10-CM | POA: Diagnosis not present

## 2016-08-24 DIAGNOSIS — M6281 Muscle weakness (generalized): Secondary | ICD-10-CM | POA: Diagnosis not present

## 2016-08-24 DIAGNOSIS — N184 Chronic kidney disease, stage 4 (severe): Secondary | ICD-10-CM | POA: Diagnosis not present

## 2016-08-24 DIAGNOSIS — M19042 Primary osteoarthritis, left hand: Secondary | ICD-10-CM | POA: Diagnosis not present

## 2016-08-27 DIAGNOSIS — R262 Difficulty in walking, not elsewhere classified: Secondary | ICD-10-CM | POA: Diagnosis not present

## 2016-08-27 DIAGNOSIS — M19042 Primary osteoarthritis, left hand: Secondary | ICD-10-CM | POA: Diagnosis not present

## 2016-08-27 DIAGNOSIS — N184 Chronic kidney disease, stage 4 (severe): Secondary | ICD-10-CM | POA: Diagnosis not present

## 2016-08-27 DIAGNOSIS — M6281 Muscle weakness (generalized): Secondary | ICD-10-CM | POA: Diagnosis not present

## 2016-08-28 DIAGNOSIS — M19042 Primary osteoarthritis, left hand: Secondary | ICD-10-CM | POA: Diagnosis not present

## 2016-08-28 DIAGNOSIS — R262 Difficulty in walking, not elsewhere classified: Secondary | ICD-10-CM | POA: Diagnosis not present

## 2016-08-28 DIAGNOSIS — M6281 Muscle weakness (generalized): Secondary | ICD-10-CM | POA: Diagnosis not present

## 2016-08-28 DIAGNOSIS — N184 Chronic kidney disease, stage 4 (severe): Secondary | ICD-10-CM | POA: Diagnosis not present

## 2016-08-29 DIAGNOSIS — M6281 Muscle weakness (generalized): Secondary | ICD-10-CM | POA: Diagnosis not present

## 2016-08-29 DIAGNOSIS — R262 Difficulty in walking, not elsewhere classified: Secondary | ICD-10-CM | POA: Diagnosis not present

## 2016-08-29 DIAGNOSIS — N184 Chronic kidney disease, stage 4 (severe): Secondary | ICD-10-CM | POA: Diagnosis not present

## 2016-08-29 DIAGNOSIS — M19042 Primary osteoarthritis, left hand: Secondary | ICD-10-CM | POA: Diagnosis not present

## 2016-08-30 DIAGNOSIS — M19042 Primary osteoarthritis, left hand: Secondary | ICD-10-CM | POA: Diagnosis not present

## 2016-08-30 DIAGNOSIS — R262 Difficulty in walking, not elsewhere classified: Secondary | ICD-10-CM | POA: Diagnosis not present

## 2016-08-30 DIAGNOSIS — M6281 Muscle weakness (generalized): Secondary | ICD-10-CM | POA: Diagnosis not present

## 2016-08-30 DIAGNOSIS — N184 Chronic kidney disease, stage 4 (severe): Secondary | ICD-10-CM | POA: Diagnosis not present

## 2016-08-31 DIAGNOSIS — N184 Chronic kidney disease, stage 4 (severe): Secondary | ICD-10-CM | POA: Diagnosis not present

## 2016-08-31 DIAGNOSIS — R262 Difficulty in walking, not elsewhere classified: Secondary | ICD-10-CM | POA: Diagnosis not present

## 2016-08-31 DIAGNOSIS — M19042 Primary osteoarthritis, left hand: Secondary | ICD-10-CM | POA: Diagnosis not present

## 2016-08-31 DIAGNOSIS — M6281 Muscle weakness (generalized): Secondary | ICD-10-CM | POA: Diagnosis not present

## 2016-09-03 DIAGNOSIS — R262 Difficulty in walking, not elsewhere classified: Secondary | ICD-10-CM | POA: Diagnosis not present

## 2016-09-03 DIAGNOSIS — N184 Chronic kidney disease, stage 4 (severe): Secondary | ICD-10-CM | POA: Diagnosis not present

## 2016-09-03 DIAGNOSIS — M19042 Primary osteoarthritis, left hand: Secondary | ICD-10-CM | POA: Diagnosis not present

## 2016-09-03 DIAGNOSIS — M6281 Muscle weakness (generalized): Secondary | ICD-10-CM | POA: Diagnosis not present

## 2016-09-04 ENCOUNTER — Encounter (HOSPITAL_COMMUNITY)
Admission: RE | Admit: 2016-09-04 | Discharge: 2016-09-04 | Disposition: A | Discharge status: Home / Self Care | Payer: Medicare Other | Source: Ambulatory Visit | Attending: Nephrology | Admitting: Nephrology

## 2016-09-04 DIAGNOSIS — M6281 Muscle weakness (generalized): Secondary | ICD-10-CM | POA: Diagnosis not present

## 2016-09-04 DIAGNOSIS — N184 Chronic kidney disease, stage 4 (severe): Secondary | ICD-10-CM | POA: Diagnosis not present

## 2016-09-04 DIAGNOSIS — R262 Difficulty in walking, not elsewhere classified: Secondary | ICD-10-CM | POA: Diagnosis not present

## 2016-09-04 DIAGNOSIS — M19042 Primary osteoarthritis, left hand: Secondary | ICD-10-CM | POA: Diagnosis not present

## 2016-09-04 DIAGNOSIS — D649 Anemia, unspecified: Secondary | ICD-10-CM | POA: Diagnosis not present

## 2016-09-04 LAB — POCT HEMOGLOBIN-HEMACUE: HEMOGLOBIN: 10.8 g/dL — AB (ref 13.0–17.0)

## 2016-09-04 MED ORDER — EPOETIN ALFA 2000 UNIT/ML IJ SOLN: 2000.0000 [IU] | Freq: Once | INTRAMUSCULAR | Status: DC

## 2016-09-05 DIAGNOSIS — M6281 Muscle weakness (generalized): Secondary | ICD-10-CM | POA: Diagnosis not present

## 2016-09-05 DIAGNOSIS — N184 Chronic kidney disease, stage 4 (severe): Secondary | ICD-10-CM | POA: Diagnosis not present

## 2016-09-05 DIAGNOSIS — M19042 Primary osteoarthritis, left hand: Secondary | ICD-10-CM | POA: Diagnosis not present

## 2016-09-05 DIAGNOSIS — R262 Difficulty in walking, not elsewhere classified: Secondary | ICD-10-CM | POA: Diagnosis not present

## 2016-09-06 DIAGNOSIS — M19042 Primary osteoarthritis, left hand: Secondary | ICD-10-CM | POA: Diagnosis not present

## 2016-09-06 DIAGNOSIS — I517 Cardiomegaly: Secondary | ICD-10-CM | POA: Diagnosis not present

## 2016-09-06 DIAGNOSIS — D649 Anemia, unspecified: Secondary | ICD-10-CM | POA: Diagnosis not present

## 2016-09-06 DIAGNOSIS — M6281 Muscle weakness (generalized): Secondary | ICD-10-CM | POA: Diagnosis not present

## 2016-09-06 DIAGNOSIS — Z79899 Other long term (current) drug therapy: Secondary | ICD-10-CM | POA: Diagnosis not present

## 2016-09-06 DIAGNOSIS — R0989 Other specified symptoms and signs involving the circulatory and respiratory systems: Secondary | ICD-10-CM | POA: Diagnosis not present

## 2016-09-06 DIAGNOSIS — R262 Difficulty in walking, not elsewhere classified: Secondary | ICD-10-CM | POA: Diagnosis not present

## 2016-09-06 DIAGNOSIS — N184 Chronic kidney disease, stage 4 (severe): Secondary | ICD-10-CM | POA: Diagnosis not present

## 2016-09-06 DIAGNOSIS — R6889 Other general symptoms and signs: Secondary | ICD-10-CM | POA: Diagnosis not present

## 2016-09-07 DIAGNOSIS — N184 Chronic kidney disease, stage 4 (severe): Secondary | ICD-10-CM | POA: Diagnosis not present

## 2016-09-07 DIAGNOSIS — M6281 Muscle weakness (generalized): Secondary | ICD-10-CM | POA: Diagnosis not present

## 2016-09-07 DIAGNOSIS — R262 Difficulty in walking, not elsewhere classified: Secondary | ICD-10-CM | POA: Diagnosis not present

## 2016-09-07 DIAGNOSIS — M19042 Primary osteoarthritis, left hand: Secondary | ICD-10-CM | POA: Diagnosis not present

## 2016-09-10 DIAGNOSIS — N184 Chronic kidney disease, stage 4 (severe): Secondary | ICD-10-CM | POA: Diagnosis not present

## 2016-09-10 DIAGNOSIS — R262 Difficulty in walking, not elsewhere classified: Secondary | ICD-10-CM | POA: Diagnosis not present

## 2016-09-10 DIAGNOSIS — M19042 Primary osteoarthritis, left hand: Secondary | ICD-10-CM | POA: Diagnosis not present

## 2016-09-10 DIAGNOSIS — M6281 Muscle weakness (generalized): Secondary | ICD-10-CM | POA: Diagnosis not present

## 2016-09-11 DIAGNOSIS — N184 Chronic kidney disease, stage 4 (severe): Secondary | ICD-10-CM | POA: Diagnosis not present

## 2016-09-11 DIAGNOSIS — M6281 Muscle weakness (generalized): Secondary | ICD-10-CM | POA: Diagnosis not present

## 2016-09-11 DIAGNOSIS — R262 Difficulty in walking, not elsewhere classified: Secondary | ICD-10-CM | POA: Diagnosis not present

## 2016-09-11 DIAGNOSIS — M19042 Primary osteoarthritis, left hand: Secondary | ICD-10-CM | POA: Diagnosis not present

## 2016-09-12 DIAGNOSIS — R262 Difficulty in walking, not elsewhere classified: Secondary | ICD-10-CM | POA: Diagnosis not present

## 2016-09-12 DIAGNOSIS — M19042 Primary osteoarthritis, left hand: Secondary | ICD-10-CM | POA: Diagnosis not present

## 2016-09-12 DIAGNOSIS — N184 Chronic kidney disease, stage 4 (severe): Secondary | ICD-10-CM | POA: Diagnosis not present

## 2016-09-12 DIAGNOSIS — M6281 Muscle weakness (generalized): Secondary | ICD-10-CM | POA: Diagnosis not present

## 2016-09-13 DIAGNOSIS — M6281 Muscle weakness (generalized): Secondary | ICD-10-CM | POA: Diagnosis not present

## 2016-09-13 DIAGNOSIS — M19042 Primary osteoarthritis, left hand: Secondary | ICD-10-CM | POA: Diagnosis not present

## 2016-09-13 DIAGNOSIS — R262 Difficulty in walking, not elsewhere classified: Secondary | ICD-10-CM | POA: Diagnosis not present

## 2016-09-13 DIAGNOSIS — N184 Chronic kidney disease, stage 4 (severe): Secondary | ICD-10-CM | POA: Diagnosis not present

## 2016-09-14 DIAGNOSIS — M19042 Primary osteoarthritis, left hand: Secondary | ICD-10-CM | POA: Diagnosis not present

## 2016-09-14 DIAGNOSIS — R262 Difficulty in walking, not elsewhere classified: Secondary | ICD-10-CM | POA: Diagnosis not present

## 2016-09-14 DIAGNOSIS — N184 Chronic kidney disease, stage 4 (severe): Secondary | ICD-10-CM | POA: Diagnosis not present

## 2016-09-14 DIAGNOSIS — M6281 Muscle weakness (generalized): Secondary | ICD-10-CM | POA: Diagnosis not present

## 2016-09-17 DIAGNOSIS — R262 Difficulty in walking, not elsewhere classified: Secondary | ICD-10-CM | POA: Diagnosis not present

## 2016-09-17 DIAGNOSIS — N184 Chronic kidney disease, stage 4 (severe): Secondary | ICD-10-CM | POA: Diagnosis not present

## 2016-09-17 DIAGNOSIS — M6281 Muscle weakness (generalized): Secondary | ICD-10-CM | POA: Diagnosis not present

## 2016-09-17 DIAGNOSIS — M19042 Primary osteoarthritis, left hand: Secondary | ICD-10-CM | POA: Diagnosis not present

## 2016-09-18 ENCOUNTER — Encounter (HOSPITAL_COMMUNITY)
Admission: RE | Admit: 2016-09-18 | Discharge: 2016-09-18 | Disposition: A | Discharge status: Home / Self Care | Payer: Medicare Other | Source: Ambulatory Visit | Attending: Nephrology | Admitting: Nephrology

## 2016-09-18 ENCOUNTER — Encounter (HOSPITAL_COMMUNITY): Payer: Self-pay

## 2016-09-18 DIAGNOSIS — R262 Difficulty in walking, not elsewhere classified: Secondary | ICD-10-CM | POA: Diagnosis not present

## 2016-09-18 DIAGNOSIS — M19042 Primary osteoarthritis, left hand: Secondary | ICD-10-CM | POA: Diagnosis not present

## 2016-09-18 DIAGNOSIS — N184 Chronic kidney disease, stage 4 (severe): Secondary | ICD-10-CM | POA: Diagnosis not present

## 2016-09-18 DIAGNOSIS — M6281 Muscle weakness (generalized): Secondary | ICD-10-CM | POA: Diagnosis not present

## 2016-09-18 DIAGNOSIS — D509 Iron deficiency anemia, unspecified: Secondary | ICD-10-CM | POA: Insufficient documentation

## 2016-09-18 LAB — POCT HEMOGLOBIN-HEMACUE: HEMOGLOBIN: 11.3 g/dL — AB (ref 13.0–17.0)

## 2016-09-18 MED ORDER — EPOETIN ALFA 2000 UNIT/ML IJ SOLN: 2000.0000 [IU] | Freq: Once | INTRAMUSCULAR | Status: DC

## 2016-09-19 DIAGNOSIS — N184 Chronic kidney disease, stage 4 (severe): Secondary | ICD-10-CM | POA: Diagnosis not present

## 2016-09-19 DIAGNOSIS — M19042 Primary osteoarthritis, left hand: Secondary | ICD-10-CM | POA: Diagnosis not present

## 2016-09-19 DIAGNOSIS — R262 Difficulty in walking, not elsewhere classified: Secondary | ICD-10-CM | POA: Diagnosis not present

## 2016-09-19 DIAGNOSIS — M6281 Muscle weakness (generalized): Secondary | ICD-10-CM | POA: Diagnosis not present

## 2016-09-20 DIAGNOSIS — M19042 Primary osteoarthritis, left hand: Secondary | ICD-10-CM | POA: Diagnosis not present

## 2016-09-20 DIAGNOSIS — M6281 Muscle weakness (generalized): Secondary | ICD-10-CM | POA: Diagnosis not present

## 2016-09-20 DIAGNOSIS — N184 Chronic kidney disease, stage 4 (severe): Secondary | ICD-10-CM | POA: Diagnosis not present

## 2016-09-20 DIAGNOSIS — R262 Difficulty in walking, not elsewhere classified: Secondary | ICD-10-CM | POA: Diagnosis not present

## 2016-09-20 NOTE — Progress Notes (Signed)
Results for JALIL, KAMHOLZ (MRN [id number]) as of 09/20/2016 12:13  Ref. Range 09/18/2016 12:48  Hemoglobin Latest Ref Range: 13.0 - 17.0 g/dL 03.0 (L)

## 2016-09-21 DIAGNOSIS — R262 Difficulty in walking, not elsewhere classified: Secondary | ICD-10-CM | POA: Diagnosis not present

## 2016-09-21 DIAGNOSIS — M19042 Primary osteoarthritis, left hand: Secondary | ICD-10-CM | POA: Diagnosis not present

## 2016-09-21 DIAGNOSIS — N184 Chronic kidney disease, stage 4 (severe): Secondary | ICD-10-CM | POA: Diagnosis not present

## 2016-09-21 DIAGNOSIS — M6281 Muscle weakness (generalized): Secondary | ICD-10-CM | POA: Diagnosis not present

## 2016-09-24 DIAGNOSIS — M19042 Primary osteoarthritis, left hand: Secondary | ICD-10-CM | POA: Diagnosis not present

## 2016-09-24 DIAGNOSIS — J449 Chronic obstructive pulmonary disease, unspecified: Secondary | ICD-10-CM | POA: Diagnosis not present

## 2016-09-24 DIAGNOSIS — M6281 Muscle weakness (generalized): Secondary | ICD-10-CM | POA: Diagnosis not present

## 2016-09-24 DIAGNOSIS — I4891 Unspecified atrial fibrillation: Secondary | ICD-10-CM | POA: Diagnosis not present

## 2016-09-24 DIAGNOSIS — R262 Difficulty in walking, not elsewhere classified: Secondary | ICD-10-CM | POA: Diagnosis not present

## 2016-09-24 DIAGNOSIS — N184 Chronic kidney disease, stage 4 (severe): Secondary | ICD-10-CM | POA: Diagnosis not present

## 2016-09-24 DIAGNOSIS — I10 Essential (primary) hypertension: Secondary | ICD-10-CM | POA: Diagnosis not present

## 2016-09-25 DIAGNOSIS — M19042 Primary osteoarthritis, left hand: Secondary | ICD-10-CM | POA: Diagnosis not present

## 2016-09-25 DIAGNOSIS — N184 Chronic kidney disease, stage 4 (severe): Secondary | ICD-10-CM | POA: Diagnosis not present

## 2016-09-25 DIAGNOSIS — M6281 Muscle weakness (generalized): Secondary | ICD-10-CM | POA: Diagnosis not present

## 2016-09-25 DIAGNOSIS — R262 Difficulty in walking, not elsewhere classified: Secondary | ICD-10-CM | POA: Diagnosis not present

## 2016-09-25 NOTE — Progress Notes (Signed)
Cardiology Office Note  Date: 09/26/2016   ID: [name] Rice, DOB Jun 07, 1935, MRN [id number]  PCP: Benita Stabile, MD  Primary Cardiologist: Nona Dell, MD   Chief Complaint  Patient presents with  . Diastolic heart failure    History of Present Illness: [name] Rice is a 81 y.o. male last seen in December 2017. He presents for a routine follow-up visit. He is wearing earphones with a hearing aid device today. States that he has had no chest pain or unusual shortness of breath. Continues to have some degree of lower leg swelling, but has been generally well-controlled on current dose of Lasix. He continues to reside in [facility].  I reviewed interval lab work obtained through his facility, outlined below.  He has not had a recent follow-up echocardiogram, but we have been holding off on this in light of relative clinical stability and no major changes in his diuretic regimen.  I personally reviewed his ECG today which shows sinus bradycardia with leftward axis and nonspecific ST-T changes.  Past Medical History:  Diagnosis Date  . Bradycardia   . CKD (chronic kidney disease), stage III    Has had temporary dialysis in the past  . COPD (chronic obstructive pulmonary disease) (HCC)   . Coronary atherosclerosis of native coronary artery    Nonobstructive  . DJD (degenerative joint disease)   . Essential hypertension, benign   . GERD (gastroesophageal reflux disease)   . HOH (hard of hearing)   . Lower extremity edema   . Mitral regurgitation    Moderate  . NICM (nonischemic cardiomyopathy) (HCC)    LVEF 55% 3/11  . Paroxysmal atrial fibrillation (HCC)   . Pulmonary hypertension (HCC)   . Tricuspid regurgitation   . Ventricular dysfunction, right     Past Surgical History:  Procedure Laterality Date  . CATARACT EXTRACTION W/ INTRAOCULAR LENS IMPLANT     right  . DIALYSIS FISTULA CREATION     left forearm  . TOTAL HIP ARTHROPLASTY  ~ 2005   left     Current Outpatient Prescriptions  Medication Sig Dispense Refill  . carvedilol (COREG) 6.25 MG tablet take 1 tablet by mouth twice a day with meals 60 tablet 6  . cholecalciferol (VITAMIN D) 1000 units tablet Take 1,000 Units by mouth daily.    Marland Kitchen docusate sodium (COLACE) 100 MG capsule Take 100 mg by mouth 2 (two) times daily.    Marland Kitchen FERREX 150 150 MG capsule Take 150 mg by mouth daily. Reported on 07/11/2015  0  . furosemide (LASIX) 20 MG tablet Take 20 mg by mouth daily.  0  . guaiFENesin (MUCINEX) 600 MG 12 hr tablet Take 600 mg by mouth 2 (two) times daily.    Marland Kitchen ipratropium-albuterol (DUONEB) 0.5-2.5 (3) MG/3ML SOLN Take 3 mLs by nebulization every 6 (six) hours as needed.     . mirtazapine (REMERON) 7.5 MG tablet Take 7.5 mg by mouth at bedtime.    Marland Kitchen omeprazole (PRILOSEC) 20 MG capsule Take 1 capsule by mouth 2 (two) times daily.    Marland Kitchen senna (SENOKOT) 8.6 MG TABS tablet Take 1 tablet by mouth daily.    Marland Kitchen tiotropium (SPIRIVA) 18 MCG inhalation capsule Place 18 mcg into inhaler and inhale daily.      No current facility-administered medications for this visit.    Allergies:  Patient has no known allergies.   Social History: The patient  reports that he quit smoking about 15 years ago. His  smoking use included Cigarettes. He started smoking about 55 years ago. He has a 40.00 pack-year smoking history. He has never used smokeless tobacco. He reports that he drinks alcohol. He reports that he does not use drugs.   ROS:  Please see the history of present illness. Otherwise, complete review of systems is positive for hearing loss.  All other systems are reviewed and negative.   Physical Exam: VS:  BP 130/60   Pulse 61   Ht 5\' 7"  (1.702 m)   Wt 143 lb 6.4 oz (65 kg)   SpO2 94% Comment: on room air  BMI 22.46 kg/m , BMI Body mass index is 22.46 kg/m.  Wt Readings from Last 3 Encounters:  09/26/16 143 lb 6.4 oz (65 kg)  09/04/16 147 lb (66.7 kg)  05/01/16 147 lb (66.7 kg)     Chronically ill-appearing male in no acute distress, in wheelchair today.  HEENT: Conjunctiva and lids are normal, oropharynx clear with poor dentition.  Neck: Supple, no elevated JVP.  Lungs: Diminished breath sounds with rhonchi. Cardiac: Regular rate and rhythm, indistinct PMI, no S3 gallop.  Abdomen: Nontender, bowel sounds present, no tenderness.  Extremities: 1+ bilateral lower leg edema, venous stasis and skin scaling.  ECG: I personally reviewed the tracing from 11/19/2014 which showed sinus rhythm with increased voltage and nonspecific ST-T changes.  Recent Labwork: 09/18/2016: Hemoglobin 11.19 September 2015: BUN 25, creatinine 1.2, potassium 4.4, hemoglobin 7.4, platelets 321 June 2018: Hemoglobin 10.3, platelets 186, BUN 19, creatinine 1.2, potassium 3.9  Other Studies Reviewed Today:  Echocardiogram 09/13/2011: Study Conclusions  - Left ventricle: The cavity size was normal. Wall thickness was increased in a pattern of mild LVH. Systolic function was normal. The estimated ejection fraction was in the range of 55% to 60%. Wall motion was normal; there were no regional wall motion abnormalities. Doppler parameters are consistent with abnormal left ventricular relaxation (grade 1 diastolic dysfunction). - Aortic valve: Trileaflet; mildly calcified leaflets. Mild regurgitation. Mean gradient: 10mm Hg (S). - Mitral valve: Mild regurgitation. - Atrial septum: No defect or patent foramen ovale was identified. - Tricuspid valve: Mild regurgitation. Peak gradient: 28mm Hg (D). - Pulmonary arteries: Systolic pressure was mildly to moderately increased. - Pericardium, extracardiac: There was no pericardial effusion.  Assessment and Plan:  1. Chronic diastolic heart failure, weight is stable and he has adequate control of leg edema on current dose of Lasix. No changes were made today.  2. History of CKD stage III, recent creatinine 1.2.  3. Chronic  anemia on Epogen, follows with nephrology. Last hemoglobin 10.3.  Current medicines were reviewed with the patient today.   Orders Placed This Encounter  Procedures  . EKG 12-Lead    Disposition: Follow-up in 6 months.  Signed, Jonelle Sidle, MD, [facility] 09/26/2016 11:39 AM    [facility] [address], [place], [place] [address] Phone: [phone]; Fax: [phone]

## 2016-09-26 ENCOUNTER — Encounter: Payer: Self-pay | Admitting: Cardiology

## 2016-09-26 ENCOUNTER — Ambulatory Visit (INDEPENDENT_AMBULATORY_CARE_PROVIDER_SITE_OTHER): Payer: Medicare Other | Admitting: Cardiology

## 2016-09-26 VITALS — BP 130/60 | HR 61 | Ht 67.0 in | Wt 143.4 lb

## 2016-09-26 DIAGNOSIS — I5032 Chronic diastolic (congestive) heart failure: Secondary | ICD-10-CM

## 2016-09-26 DIAGNOSIS — N184 Chronic kidney disease, stage 4 (severe): Secondary | ICD-10-CM | POA: Diagnosis not present

## 2016-09-26 DIAGNOSIS — D638 Anemia in other chronic diseases classified elsewhere: Secondary | ICD-10-CM

## 2016-09-26 DIAGNOSIS — N183 Chronic kidney disease, stage 3 unspecified: Secondary | ICD-10-CM

## 2016-09-26 DIAGNOSIS — M6281 Muscle weakness (generalized): Secondary | ICD-10-CM | POA: Diagnosis not present

## 2016-09-26 DIAGNOSIS — M19042 Primary osteoarthritis, left hand: Secondary | ICD-10-CM | POA: Diagnosis not present

## 2016-09-26 DIAGNOSIS — R262 Difficulty in walking, not elsewhere classified: Secondary | ICD-10-CM | POA: Diagnosis not present

## 2016-09-26 NOTE — Patient Instructions (Signed)

## 2016-09-27 DIAGNOSIS — N184 Chronic kidney disease, stage 4 (severe): Secondary | ICD-10-CM | POA: Diagnosis not present

## 2016-09-27 DIAGNOSIS — R262 Difficulty in walking, not elsewhere classified: Secondary | ICD-10-CM | POA: Diagnosis not present

## 2016-09-27 DIAGNOSIS — M6281 Muscle weakness (generalized): Secondary | ICD-10-CM | POA: Diagnosis not present

## 2016-09-27 DIAGNOSIS — M19042 Primary osteoarthritis, left hand: Secondary | ICD-10-CM | POA: Diagnosis not present

## 2016-09-28 DIAGNOSIS — R262 Difficulty in walking, not elsewhere classified: Secondary | ICD-10-CM | POA: Diagnosis not present

## 2016-09-28 DIAGNOSIS — M19042 Primary osteoarthritis, left hand: Secondary | ICD-10-CM | POA: Diagnosis not present

## 2016-09-28 DIAGNOSIS — M6281 Muscle weakness (generalized): Secondary | ICD-10-CM | POA: Diagnosis not present

## 2016-09-28 DIAGNOSIS — N184 Chronic kidney disease, stage 4 (severe): Secondary | ICD-10-CM | POA: Diagnosis not present

## 2016-10-01 DIAGNOSIS — M19042 Primary osteoarthritis, left hand: Secondary | ICD-10-CM | POA: Diagnosis not present

## 2016-10-01 DIAGNOSIS — R262 Difficulty in walking, not elsewhere classified: Secondary | ICD-10-CM | POA: Diagnosis not present

## 2016-10-01 DIAGNOSIS — N184 Chronic kidney disease, stage 4 (severe): Secondary | ICD-10-CM | POA: Diagnosis not present

## 2016-10-01 DIAGNOSIS — M6281 Muscle weakness (generalized): Secondary | ICD-10-CM | POA: Diagnosis not present

## 2016-10-02 ENCOUNTER — Encounter (HOSPITAL_COMMUNITY)
Admission: RE | Admit: 2016-10-02 | Discharge: 2016-10-02 | Disposition: A | Discharge status: Home / Self Care | Payer: Medicare Other | Source: Ambulatory Visit | Attending: Nephrology | Admitting: Nephrology

## 2016-10-02 DIAGNOSIS — D509 Iron deficiency anemia, unspecified: Secondary | ICD-10-CM | POA: Diagnosis not present

## 2016-10-02 DIAGNOSIS — R262 Difficulty in walking, not elsewhere classified: Secondary | ICD-10-CM | POA: Diagnosis not present

## 2016-10-02 DIAGNOSIS — N184 Chronic kidney disease, stage 4 (severe): Secondary | ICD-10-CM | POA: Diagnosis not present

## 2016-10-02 DIAGNOSIS — M19042 Primary osteoarthritis, left hand: Secondary | ICD-10-CM | POA: Diagnosis not present

## 2016-10-02 DIAGNOSIS — M6281 Muscle weakness (generalized): Secondary | ICD-10-CM | POA: Diagnosis not present

## 2016-10-02 LAB — POCT HEMOGLOBIN-HEMACUE: HEMOGLOBIN: 11.3 g/dL — AB (ref 13.0–17.0)

## 2016-10-03 DIAGNOSIS — M6281 Muscle weakness (generalized): Secondary | ICD-10-CM | POA: Diagnosis not present

## 2016-10-03 DIAGNOSIS — N184 Chronic kidney disease, stage 4 (severe): Secondary | ICD-10-CM | POA: Diagnosis not present

## 2016-10-03 DIAGNOSIS — M19042 Primary osteoarthritis, left hand: Secondary | ICD-10-CM | POA: Diagnosis not present

## 2016-10-03 DIAGNOSIS — R262 Difficulty in walking, not elsewhere classified: Secondary | ICD-10-CM | POA: Diagnosis not present

## 2016-10-03 NOTE — Progress Notes (Signed)
Results for KAENON, FUHRMEISTER (MRN [id number]) as of 10/03/2016 12:58  Ref. Range 10/02/2016 13:07  Hemoglobin Latest Ref Range: 13.0 - 17.0 g/dL 29.5 (L)

## 2016-10-04 DIAGNOSIS — M19042 Primary osteoarthritis, left hand: Secondary | ICD-10-CM | POA: Diagnosis not present

## 2016-10-04 DIAGNOSIS — N184 Chronic kidney disease, stage 4 (severe): Secondary | ICD-10-CM | POA: Diagnosis not present

## 2016-10-04 DIAGNOSIS — R262 Difficulty in walking, not elsewhere classified: Secondary | ICD-10-CM | POA: Diagnosis not present

## 2016-10-04 DIAGNOSIS — M6281 Muscle weakness (generalized): Secondary | ICD-10-CM | POA: Diagnosis not present

## 2016-10-05 DIAGNOSIS — M19042 Primary osteoarthritis, left hand: Secondary | ICD-10-CM | POA: Diagnosis not present

## 2016-10-05 DIAGNOSIS — M6281 Muscle weakness (generalized): Secondary | ICD-10-CM | POA: Diagnosis not present

## 2016-10-05 DIAGNOSIS — R262 Difficulty in walking, not elsewhere classified: Secondary | ICD-10-CM | POA: Diagnosis not present

## 2016-10-05 DIAGNOSIS — N184 Chronic kidney disease, stage 4 (severe): Secondary | ICD-10-CM | POA: Diagnosis not present

## 2016-10-08 DIAGNOSIS — N184 Chronic kidney disease, stage 4 (severe): Secondary | ICD-10-CM | POA: Diagnosis not present

## 2016-10-08 DIAGNOSIS — M6281 Muscle weakness (generalized): Secondary | ICD-10-CM | POA: Diagnosis not present

## 2016-10-08 DIAGNOSIS — M19042 Primary osteoarthritis, left hand: Secondary | ICD-10-CM | POA: Diagnosis not present

## 2016-10-08 DIAGNOSIS — R262 Difficulty in walking, not elsewhere classified: Secondary | ICD-10-CM | POA: Diagnosis not present

## 2016-10-09 DIAGNOSIS — N184 Chronic kidney disease, stage 4 (severe): Secondary | ICD-10-CM | POA: Diagnosis not present

## 2016-10-09 DIAGNOSIS — R262 Difficulty in walking, not elsewhere classified: Secondary | ICD-10-CM | POA: Diagnosis not present

## 2016-10-09 DIAGNOSIS — N183 Chronic kidney disease, stage 3 (moderate): Secondary | ICD-10-CM | POA: Diagnosis not present

## 2016-10-09 DIAGNOSIS — M6281 Muscle weakness (generalized): Secondary | ICD-10-CM | POA: Diagnosis not present

## 2016-10-09 DIAGNOSIS — D649 Anemia, unspecified: Secondary | ICD-10-CM | POA: Diagnosis not present

## 2016-10-09 DIAGNOSIS — M19042 Primary osteoarthritis, left hand: Secondary | ICD-10-CM | POA: Diagnosis not present

## 2016-10-10 DIAGNOSIS — M6281 Muscle weakness (generalized): Secondary | ICD-10-CM | POA: Diagnosis not present

## 2016-10-10 DIAGNOSIS — R262 Difficulty in walking, not elsewhere classified: Secondary | ICD-10-CM | POA: Diagnosis not present

## 2016-10-10 DIAGNOSIS — M19042 Primary osteoarthritis, left hand: Secondary | ICD-10-CM | POA: Diagnosis not present

## 2016-10-10 DIAGNOSIS — N184 Chronic kidney disease, stage 4 (severe): Secondary | ICD-10-CM | POA: Diagnosis not present

## 2016-10-11 DIAGNOSIS — M6281 Muscle weakness (generalized): Secondary | ICD-10-CM | POA: Diagnosis not present

## 2016-10-11 DIAGNOSIS — R262 Difficulty in walking, not elsewhere classified: Secondary | ICD-10-CM | POA: Diagnosis not present

## 2016-10-11 DIAGNOSIS — M19042 Primary osteoarthritis, left hand: Secondary | ICD-10-CM | POA: Diagnosis not present

## 2016-10-11 DIAGNOSIS — N184 Chronic kidney disease, stage 4 (severe): Secondary | ICD-10-CM | POA: Diagnosis not present

## 2016-10-12 DIAGNOSIS — M6281 Muscle weakness (generalized): Secondary | ICD-10-CM | POA: Diagnosis not present

## 2016-10-12 DIAGNOSIS — R262 Difficulty in walking, not elsewhere classified: Secondary | ICD-10-CM | POA: Diagnosis not present

## 2016-10-12 DIAGNOSIS — N184 Chronic kidney disease, stage 4 (severe): Secondary | ICD-10-CM | POA: Diagnosis not present

## 2016-10-12 DIAGNOSIS — M19042 Primary osteoarthritis, left hand: Secondary | ICD-10-CM | POA: Diagnosis not present

## 2016-10-15 DIAGNOSIS — M6281 Muscle weakness (generalized): Secondary | ICD-10-CM | POA: Diagnosis not present

## 2016-10-15 DIAGNOSIS — R262 Difficulty in walking, not elsewhere classified: Secondary | ICD-10-CM | POA: Diagnosis not present

## 2016-10-15 DIAGNOSIS — M19042 Primary osteoarthritis, left hand: Secondary | ICD-10-CM | POA: Diagnosis not present

## 2016-10-15 DIAGNOSIS — N184 Chronic kidney disease, stage 4 (severe): Secondary | ICD-10-CM | POA: Diagnosis not present

## 2016-10-15 MED ORDER — EPOETIN ALFA 2000 UNIT/ML IJ SOLN: 2000.0000 [IU] | INTRAMUSCULAR | Status: DC

## 2016-10-16 ENCOUNTER — Encounter (HOSPITAL_COMMUNITY)
Admission: RE | Admit: 2016-10-16 | Discharge: 2016-10-16 | Disposition: A | Discharge status: Home / Self Care | Payer: Medicare Other | Source: Ambulatory Visit | Attending: Nephrology | Admitting: Nephrology

## 2016-10-16 DIAGNOSIS — M6281 Muscle weakness (generalized): Secondary | ICD-10-CM | POA: Diagnosis not present

## 2016-10-16 DIAGNOSIS — D509 Iron deficiency anemia, unspecified: Secondary | ICD-10-CM | POA: Diagnosis not present

## 2016-10-16 DIAGNOSIS — M19042 Primary osteoarthritis, left hand: Secondary | ICD-10-CM | POA: Diagnosis not present

## 2016-10-16 DIAGNOSIS — R262 Difficulty in walking, not elsewhere classified: Secondary | ICD-10-CM | POA: Diagnosis not present

## 2016-10-16 DIAGNOSIS — N184 Chronic kidney disease, stage 4 (severe): Secondary | ICD-10-CM | POA: Diagnosis not present

## 2016-10-16 LAB — POCT HEMOGLOBIN-HEMACUE: Hemoglobin: 11.5 g/dL — ABNORMAL LOW (ref 13.0–17.0)

## 2016-10-16 NOTE — Progress Notes (Signed)
Results for CAELON, KALLSEN (MRN [id number]) as of 10/16/2016 13:56  Ref. Range 10/16/2016 13:34  Hemoglobin Latest Ref Range: 13.0 - 17.0 g/dL 75.1 (L)

## 2016-10-30 ENCOUNTER — Encounter (HOSPITAL_COMMUNITY): Payer: Self-pay

## 2016-10-30 ENCOUNTER — Encounter (HOSPITAL_COMMUNITY)
Admission: RE | Admit: 2016-10-30 | Discharge: 2016-10-30 | Disposition: A | Discharge status: Home / Self Care | Payer: Medicare Other | Source: Ambulatory Visit | Attending: Nephrology | Admitting: Nephrology

## 2016-10-30 DIAGNOSIS — N184 Chronic kidney disease, stage 4 (severe): Secondary | ICD-10-CM | POA: Diagnosis not present

## 2016-10-30 DIAGNOSIS — D631 Anemia in chronic kidney disease: Secondary | ICD-10-CM | POA: Insufficient documentation

## 2016-10-30 LAB — POCT HEMOGLOBIN-HEMACUE: Hemoglobin: 12.8 g/dL — ABNORMAL LOW (ref 13.0–17.0)

## 2016-10-30 NOTE — Progress Notes (Signed)
Results for MAIKO, IFILL (MRN [id number]) as of 10/30/2016 13:33   No Procrit shot today, has not had an injection since February 2018.  Office called to relay message to Dr. Kristian Covey to possibly  extend the time between visits.   Ref. Range 10/30/2016 13:33  Hemoglobin Latest Ref Range: 13.0 - 17.0 g/dL 75.1 (L)

## 2016-11-13 ENCOUNTER — Encounter (HOSPITAL_COMMUNITY): Admission: RE | Admit: 2016-11-13 | Payer: Medicare Other | Source: Ambulatory Visit

## 2016-11-13 ENCOUNTER — Encounter (HOSPITAL_COMMUNITY): Payer: Medicare Other

## 2016-11-15 ENCOUNTER — Encounter (HOSPITAL_COMMUNITY)
Admission: RE | Admit: 2016-11-15 | Discharge: 2016-11-15 | Disposition: A | Discharge status: Home / Self Care | Payer: Medicare Other | Source: Ambulatory Visit | Attending: Nephrology | Admitting: Nephrology

## 2016-11-15 ENCOUNTER — Encounter (HOSPITAL_COMMUNITY): Payer: Self-pay

## 2016-11-15 DIAGNOSIS — N184 Chronic kidney disease, stage 4 (severe): Secondary | ICD-10-CM | POA: Diagnosis not present

## 2016-11-15 LAB — POCT HEMOGLOBIN-HEMACUE: HEMOGLOBIN: 10.9 g/dL — AB (ref 13.0–17.0)

## 2016-11-15 NOTE — Progress Notes (Signed)
Results for DIXON, VROOM (MRN [id number]) as of 11/15/2016 13:23  Ref. Range 11/15/2016 13:13  Hemoglobin Latest Ref Range: 13.0 - 17.0 g/dL 92.4 (L)

## 2016-11-20 NOTE — Progress Notes (Signed)
Talked with Okey Regal, Mr Crew's nurse, at [facility] to verify that the order had been d/c'd and informed her the appt for 11/29/16 will be canceled. Stated that she did not know for sure if it had been canceled but would check on the order and let staff know.

## 2016-11-20 NOTE — Progress Notes (Signed)
Pt has not received procrit injection since 04/03/16. Hgb above 10.Talked with Misty Stanley at Dr Susa Griffins office to check if wanted to continue with Hgb and possible procrit injection. Stated that this order had been D/C. Stated she would fax [place] an order tomorrow. Will wait on order before canceling 11/29/16 appt.

## 2016-11-29 ENCOUNTER — Ambulatory Visit (HOSPITAL_COMMUNITY): Payer: Medicare Other

## 2016-11-29 ENCOUNTER — Inpatient Hospital Stay (HOSPITAL_COMMUNITY)
Admission: RE | Admit: 2016-11-29 | Discharge: 2016-11-29 | Disposition: A | Discharge status: Home / Self Care | Payer: Medicare Other | Source: Ambulatory Visit

## 2017-03-28 ENCOUNTER — Encounter (INDEPENDENT_AMBULATORY_CARE_PROVIDER_SITE_OTHER): Payer: Self-pay | Admitting: Internal Medicine

## 2017-05-28 ENCOUNTER — Ambulatory Visit (INDEPENDENT_AMBULATORY_CARE_PROVIDER_SITE_OTHER): Payer: Medicare Other | Admitting: Internal Medicine

## 2017-05-28 ENCOUNTER — Encounter (INDEPENDENT_AMBULATORY_CARE_PROVIDER_SITE_OTHER): Payer: Self-pay | Admitting: Internal Medicine

## 2017-05-28 VITALS — BP 108/58 | HR 64 | Temp 98.1°F | Resp 18 | Ht <= 58 in | Wt 144.0 lb

## 2017-05-28 DIAGNOSIS — D649 Anemia, unspecified: Secondary | ICD-10-CM

## 2017-05-28 NOTE — Patient Instructions (Signed)
Will wait for results of blood test and stool study before making further recommendations.

## 2017-05-28 NOTE — Progress Notes (Signed)
Reason for Consultatio;  Iron deficiency Anemia.  History of present illness:  History is provided by the patient and there is also obtained from his chart.  Patient is a 82 year old Caucasian male who was referred through courtesy of Drs. alcohol for evaluation of anemia which is presumed to be due to iron deficiency.  Patient has been previously evaluated in August 2017 and February 2018 but he declined to undergo further testing.  He has a history of GERD and is maintained on omeprazole.  He denies heartburn or dysphagia.  He also denies nausea vomiting melena rectal bleeding or abdominal pain.  His bowels move as long as he takes medication.  He says his appetite is up and down.  He does not know if he has lost any weight this year. He also denies hematuria. Is been cared for at [facility] which is a nursing facility and reads well.  He has been at this facility for 6 years.  He is unable to walk.  The extremity is limited to bed and wheelchair.  He states while he was getting physical therapy he was able to walk with help and a walker but when physical therapy was discontinued he has not able to walk anymore.  He is single.  He never married and does not have any children.  He works for [employer]  He drove dump truck.  He smokes cigarettes for several years and quit 10 years ago.  He does not drink alcohol. He has 2 sisters living.  He does not have regular contact with them.  Both of his brothers are disease.  They both had cancer but no details are available.   Current Medications: Outpatient Encounter Medications as of 05/28/2017  Medication Sig  . carvedilol (COREG) 6.25 MG tablet take 1 tablet by mouth twice a day with meals  . docusate sodium (COLACE) 100 MG capsule Take 100 mg by mouth 2 (two) times daily.  Marland Kitchen guaiFENesin (MUCINEX) 600 MG 12 hr tablet Take 600 mg by mouth 2 (two) times daily.  . hydrocortisone cream 1 % Apply 1 application topically 2 (two) times daily.  Marland Kitchen  ipratropium-albuterol (DUONEB) 0.5-2.5 (3) MG/3ML SOLN Take 3 mLs by nebulization every 6 (six) hours as needed.   . iron polysaccharides (NU-IRON) 150 MG capsule Take 150 mg by mouth 2 (two) times daily.  . mirtazapine (REMERON) 7.5 MG tablet Take 15 mg by mouth at bedtime.   Marland Kitchen omeprazole (PRILOSEC) 20 MG capsule Take 1 capsule by mouth 2 (two) times daily.  Marland Kitchen senna (SENOKOT) 8.6 MG TABS tablet Take 1 tablet by mouth daily.  Marland Kitchen tiotropium (SPIRIVA) 18 MCG inhalation capsule Place 18 mcg into inhaler and inhale daily.   . cholecalciferol (VITAMIN D) 1000 units tablet Take 1,000 Units by mouth daily.  . [DISCONTINUED] FERREX 150 150 MG capsule Take 150 mg by mouth daily. Reported on 07/11/2015  . [DISCONTINUED] furosemide (LASIX) 20 MG tablet Take 20 mg by mouth daily.   No facility-administered encounter medications on file as of 05/28/2017.    Past Medical History:  Diagnosis Date  .  Deformity to left hand secondary to arthritis.   . CKD (chronic kidney disease), stage III (HCC)    Has had temporary dialysis in the past  . COPD (chronic obstructive pulmonary disease) (HCC)   . Coronary atherosclerosis of native coronary artery    Nonobstructive  . DJD (degenerative joint disease)   . Essential hypertension, benign   . GERD (gastroesophageal reflux disease)   .  HOH (hard of hearing)   . Lower extremity edema   . Mitral regurgitation    Moderate  . NICM (nonischemic cardiomyopathy) (HCC)    LVEF 55% 3/11  . Paroxysmal atrial fibrillation (HCC)   . Pulmonary hypertension (HCC)   . Tricuspid regurgitation   . Ventricular dysfunction, right    Past Surgical History:  Procedure Laterality Date  . CATARACT EXTRACTION W/ INTRAOCULAR LENS IMPLANT     right  . DIALYSIS FISTULA CREATION     left forearm  . TOTAL HIP ARTHROPLASTY  ~ 2005   left      Physical examination.: Blood pressure (!) 108/58, pulse 64, temperature 98.1 F (36.7 C), temperature source Oral, resp. rate 18,  height 4\' 8"  (1.422 m), weight 144 lb (65.3 kg). Patient appears to be comfortable in wheelchair. He responds appropriately to questions. Very loose fitting dentures which seem to impair his speech. Conjunctiva is pink. Sclera is nonicteric Oropharyngeal mucosa is normal. No neck masses or thyromegaly noted. Cardiac exam with regular rhythm normal S1 and S2. No murmur or gallop noted. Lungs are clear to auscultation. Abdomen is full but soft and nontender without organomegaly or masses. Left hand is secured in a splint.  He has minimal movement of his fingers. Trace edema around ankles.  Labs/studies Results:  Lab data from 04/29/2017 WBC 8.1, H&H 11.3 and 34.5 MCV 94.5 and platelet count 182K. Serum sodium 146, potassium 3.8, chloride 113 CO2 22 BUN 27 and creatinine 1.56. Course 79. Calcium 8.9.   Assessment:  Patient is a 82 year old Caucasian male with multiple medical problems who is a resident of nursing home who presents with anemia.  History is given of iron deficiency anemia.  I do not see recent iron studies.  Lab studies from from January 2009 revealed serum iron of 34 TIBC of 137 and saturation was 25%. Iron studies from October 2009 were normal(from iron 59 TIBC 274 and saturation 22%). This patient has anemia of chronic disease.  Iron studies will be repeated to make sure his anemia is not multifactorial.  History of GERD.  Symptoms appear to be well controlled with omeprazole.   Recommendations:  Hemoccult x1. Will check serum iron TIBC and ferritin. Recommendations to follow.

## 2018-07-15 ENCOUNTER — Other Ambulatory Visit: Payer: Self-pay | Admitting: *Deleted

## 2018-10-15 ENCOUNTER — Emergency Department (HOSPITAL_COMMUNITY): Payer: Medicare Other

## 2018-10-15 ENCOUNTER — Encounter (HOSPITAL_COMMUNITY): Payer: Self-pay

## 2018-10-15 ENCOUNTER — Inpatient Hospital Stay (HOSPITAL_COMMUNITY)
Admission: EM | Admit: 2018-10-15 | Discharge: 2018-10-17 | DRG: 194 | Disposition: A | Discharge status: Skilled Nursing Facility | Payer: Medicare Other | Attending: Internal Medicine | Admitting: Internal Medicine

## 2018-10-15 ENCOUNTER — Other Ambulatory Visit: Payer: Self-pay

## 2018-10-15 DIAGNOSIS — K219 Gastro-esophageal reflux disease without esophagitis: Secondary | ICD-10-CM | POA: Diagnosis present

## 2018-10-15 DIAGNOSIS — R0902 Hypoxemia: Secondary | ICD-10-CM | POA: Diagnosis present

## 2018-10-15 DIAGNOSIS — M1990 Unspecified osteoarthritis, unspecified site: Secondary | ICD-10-CM | POA: Diagnosis present

## 2018-10-15 DIAGNOSIS — J189 Pneumonia, unspecified organism: Secondary | ICD-10-CM | POA: Diagnosis present

## 2018-10-15 DIAGNOSIS — I10 Essential (primary) hypertension: Secondary | ICD-10-CM | POA: Diagnosis not present

## 2018-10-15 DIAGNOSIS — Z79899 Other long term (current) drug therapy: Secondary | ICD-10-CM

## 2018-10-15 DIAGNOSIS — I872 Venous insufficiency (chronic) (peripheral): Secondary | ICD-10-CM | POA: Diagnosis present

## 2018-10-15 DIAGNOSIS — F419 Anxiety disorder, unspecified: Secondary | ICD-10-CM | POA: Diagnosis present

## 2018-10-15 DIAGNOSIS — I081 Rheumatic disorders of both mitral and tricuspid valves: Secondary | ICD-10-CM | POA: Diagnosis present

## 2018-10-15 DIAGNOSIS — Z20828 Contact with and (suspected) exposure to other viral communicable diseases: Secondary | ICD-10-CM | POA: Diagnosis present

## 2018-10-15 DIAGNOSIS — I428 Other cardiomyopathies: Secondary | ICD-10-CM | POA: Diagnosis present

## 2018-10-15 DIAGNOSIS — I2510 Atherosclerotic heart disease of native coronary artery without angina pectoris: Secondary | ICD-10-CM | POA: Diagnosis present

## 2018-10-15 DIAGNOSIS — Z7951 Long term (current) use of inhaled steroids: Secondary | ICD-10-CM | POA: Diagnosis not present

## 2018-10-15 DIAGNOSIS — I129 Hypertensive chronic kidney disease with stage 1 through stage 4 chronic kidney disease, or unspecified chronic kidney disease: Secondary | ICD-10-CM | POA: Diagnosis present

## 2018-10-15 DIAGNOSIS — J449 Chronic obstructive pulmonary disease, unspecified: Secondary | ICD-10-CM | POA: Diagnosis not present

## 2018-10-15 DIAGNOSIS — Z96652 Presence of left artificial knee joint: Secondary | ICD-10-CM | POA: Diagnosis present

## 2018-10-15 DIAGNOSIS — L8990 Pressure ulcer of unspecified site, unspecified stage: Secondary | ICD-10-CM | POA: Diagnosis present

## 2018-10-15 DIAGNOSIS — F329 Major depressive disorder, single episode, unspecified: Secondary | ICD-10-CM | POA: Diagnosis present

## 2018-10-15 DIAGNOSIS — I480 Paroxysmal atrial fibrillation: Secondary | ICD-10-CM | POA: Diagnosis present

## 2018-10-15 DIAGNOSIS — H9190 Unspecified hearing loss, unspecified ear: Secondary | ICD-10-CM | POA: Diagnosis present

## 2018-10-15 DIAGNOSIS — J440 Chronic obstructive pulmonary disease with acute lower respiratory infection: Secondary | ICD-10-CM | POA: Diagnosis present

## 2018-10-15 DIAGNOSIS — N183 Chronic kidney disease, stage 3 unspecified: Secondary | ICD-10-CM | POA: Diagnosis present

## 2018-10-15 DIAGNOSIS — Y95 Nosocomial condition: Secondary | ICD-10-CM | POA: Diagnosis present

## 2018-10-15 DIAGNOSIS — R8290 Unspecified abnormal findings in urine: Secondary | ICD-10-CM

## 2018-10-15 HISTORY — DX: Zoster without complications: B02.9

## 2018-10-15 HISTORY — DX: Unspecified atrial fibrillation: I48.91

## 2018-10-15 LAB — CBC WITH DIFFERENTIAL/PLATELET
Abs Immature Granulocytes: 0.12 10*3/uL — ABNORMAL HIGH (ref 0.00–0.07)
Basophils Absolute: 0 10*3/uL (ref 0.0–0.1)
Basophils Relative: 0 %
Eosinophils Absolute: 0.2 10*3/uL (ref 0.0–0.5)
Eosinophils Relative: 2 %
HCT: 38.8 % — ABNORMAL LOW (ref 39.0–52.0)
Hemoglobin: 12.2 g/dL — ABNORMAL LOW (ref 13.0–17.0)
Immature Granulocytes: 1 %
Lymphocytes Relative: 12 %
Lymphs Abs: 1.3 10*3/uL (ref 0.7–4.0)
MCH: 31.2 pg (ref 26.0–34.0)
MCHC: 31.4 g/dL (ref 30.0–36.0)
MCV: 99.2 fL (ref 80.0–100.0)
Monocytes Absolute: 0.8 10*3/uL (ref 0.1–1.0)
Monocytes Relative: 8 %
Neutro Abs: 8 10*3/uL — ABNORMAL HIGH (ref 1.7–7.7)
Neutrophils Relative %: 77 %
Platelets: 203 10*3/uL (ref 150–400)
RBC: 3.91 MIL/uL — ABNORMAL LOW (ref 4.22–5.81)
RDW: 15.3 % (ref 11.5–15.5)
WBC: 10.4 10*3/uL (ref 4.0–10.5)
nRBC: 0 % (ref 0.0–0.2)

## 2018-10-15 LAB — BASIC METABOLIC PANEL WITH GFR
Anion gap: 8 (ref 5–15)
BUN: 35 mg/dL — ABNORMAL HIGH (ref 8–23)
CO2: 22 mmol/L (ref 22–32)
Calcium: 8.8 mg/dL — ABNORMAL LOW (ref 8.9–10.3)
Chloride: 106 mmol/L (ref 98–111)
Creatinine, Ser: 1.42 mg/dL — ABNORMAL HIGH (ref 0.61–1.24)
GFR calc Af Amer: 53 mL/min — ABNORMAL LOW
GFR calc non Af Amer: 46 mL/min — ABNORMAL LOW
Glucose, Bld: 166 mg/dL — ABNORMAL HIGH (ref 70–99)
Potassium: 4 mmol/L (ref 3.5–5.1)
Sodium: 136 mmol/L (ref 135–145)

## 2018-10-15 LAB — LACTIC ACID, PLASMA: Lactic Acid, Venous: 1.9 mmol/L (ref 0.5–1.9)

## 2018-10-15 LAB — SARS CORONAVIRUS 2 BY RT PCR (HOSPITAL ORDER, PERFORMED IN ~~LOC~~ HOSPITAL LAB): SARS Coronavirus 2: NEGATIVE

## 2018-10-15 MED ORDER — SODIUM CHLORIDE 0.9 % IV SOLN
500.0000 mg | INTRAVENOUS | Status: DC
  Administered 2018-10-15 – 2018-10-17 (×3): 500 mg via INTRAVENOUS
  Filled 2018-10-15 (×3): qty 500

## 2018-10-15 MED ORDER — SODIUM CHLORIDE 0.9 % IV SOLN
1.0000 g | INTRAVENOUS | Status: DC
  Administered 2018-10-16 – 2018-10-17 (×2): 1 g via INTRAVENOUS
  Filled 2018-10-15 (×2): qty 10

## 2018-10-15 MED ORDER — ACETAMINOPHEN 325 MG PO TABS
650.0000 mg | ORAL_TABLET | Freq: Once | ORAL | Status: AC
  Administered 2018-10-15: 16:00:00 650 mg via ORAL
  Filled 2018-10-15: qty 2

## 2018-10-15 MED ORDER — SODIUM CHLORIDE 0.9 % IV SOLN
1.0000 g | Freq: Once | INTRAVENOUS | Status: AC
  Administered 2018-10-15: 1 g via INTRAVENOUS
  Filled 2018-10-15: qty 10

## 2018-10-15 MED ORDER — SODIUM CHLORIDE 0.9 % IV SOLN
INTRAVENOUS | Status: DC
  Administered 2018-10-15 – 2018-10-16 (×2): via INTRAVENOUS

## 2018-10-15 MED ORDER — SENNOSIDES-DOCUSATE SODIUM 8.6-50 MG PO TABS
1.0000 | ORAL_TABLET | Freq: Every day | ORAL | Status: DC
  Administered 2018-10-16: 09:00:00 1 via ORAL
  Filled 2018-10-15 (×2): qty 1

## 2018-10-15 MED ORDER — DOCUSATE SODIUM 100 MG PO CAPS: 100.0000 mg | ORAL_CAPSULE | Freq: Two times a day (BID) | ORAL | Status: DC | PRN

## 2018-10-15 MED ORDER — PANTOPRAZOLE SODIUM 40 MG PO TBEC
40.0000 mg | DELAYED_RELEASE_TABLET | Freq: Every day | ORAL | Status: DC
  Administered 2018-10-16: 40 mg via ORAL
  Filled 2018-10-15 (×2): qty 1

## 2018-10-15 MED ORDER — LORATADINE 10 MG PO TABS
10.0000 mg | ORAL_TABLET | Freq: Every day | ORAL | Status: DC
  Administered 2018-10-16: 10 mg via ORAL
  Filled 2018-10-15 (×2): qty 1

## 2018-10-15 MED ORDER — POLYETHYLENE GLYCOL 3350 17 GM/SCOOP PO POWD
17.0000 g | Freq: Every day | ORAL | Status: DC
  Filled 2018-10-15: qty 255

## 2018-10-15 MED ORDER — IPRATROPIUM-ALBUTEROL 0.5-2.5 (3) MG/3ML IN SOLN
3.0000 mL | Freq: Every day | RESPIRATORY_TRACT | Status: DC
  Administered 2018-10-16: 3 mL via RESPIRATORY_TRACT
  Filled 2018-10-15 (×2): qty 3

## 2018-10-15 MED ORDER — GUAIFENESIN ER 600 MG PO TB12
600.0000 mg | ORAL_TABLET | Freq: Two times a day (BID) | ORAL | Status: DC
  Administered 2018-10-15 – 2018-10-16 (×3): 600 mg via ORAL
  Filled 2018-10-15 (×4): qty 1

## 2018-10-15 MED ORDER — HYPROMELLOSE (GONIOSCOPIC) 2.5 % OP SOLN
2.0000 [drp] | Freq: Three times a day (TID) | OPHTHALMIC | Status: DC
  Filled 2018-10-15: qty 15

## 2018-10-15 MED ORDER — ENOXAPARIN SODIUM 40 MG/0.4ML ~~LOC~~ SOLN
40.0000 mg | SUBCUTANEOUS | Status: DC
  Administered 2018-10-15 – 2018-10-17 (×2): 40 mg via SUBCUTANEOUS
  Filled 2018-10-15 (×2): qty 0.4

## 2018-10-15 MED ORDER — ACETAMINOPHEN 325 MG PO TABS: 650.0000 mg | ORAL_TABLET | Freq: Four times a day (QID) | ORAL | Status: DC | PRN

## 2018-10-15 MED ORDER — TIOTROPIUM BROMIDE MONOHYDRATE 18 MCG IN CAPS
18.0000 ug | ORAL_CAPSULE | Freq: Every day | RESPIRATORY_TRACT | Status: DC
  Filled 2018-10-15: qty 5

## 2018-10-15 MED ORDER — ACETAMINOPHEN 650 MG RE SUPP: 650.0000 mg | Freq: Four times a day (QID) | RECTAL | Status: DC | PRN

## 2018-10-15 MED ORDER — ALBUTEROL SULFATE (2.5 MG/3ML) 0.083% IN NEBU: 2.5000 mg | INHALATION_SOLUTION | RESPIRATORY_TRACT | Status: DC | PRN

## 2018-10-15 MED ORDER — ASPIRIN EC 81 MG PO TBEC
81.0000 mg | DELAYED_RELEASE_TABLET | Freq: Every day | ORAL | Status: DC
  Administered 2018-10-16: 81 mg via ORAL
  Filled 2018-10-15 (×2): qty 1

## 2018-10-15 MED ORDER — CARVEDILOL 3.125 MG PO TABS
6.2500 mg | ORAL_TABLET | Freq: Every morning | ORAL | Status: DC
  Administered 2018-10-16: 6.25 mg via ORAL
  Filled 2018-10-15 (×2): qty 2

## 2018-10-15 MED ORDER — MIRTAZAPINE 15 MG PO TABS
15.0000 mg | ORAL_TABLET | Freq: Every day | ORAL | Status: DC
  Administered 2018-10-15 – 2018-10-16 (×2): 15 mg via ORAL
  Filled 2018-10-15 (×2): qty 1

## 2018-10-15 MED ORDER — ACETAMINOPHEN 325 MG PO TABS
650.0000 mg | ORAL_TABLET | Freq: Once | ORAL | Status: AC
  Administered 2018-10-15: 650 mg via ORAL
  Filled 2018-10-15: qty 2

## 2018-10-15 NOTE — ED Triage Notes (Signed)
[facility] staff reports pt went to PT today and when he came back he was cyanotic around lips and on hands, sob, congested, and o2 sat 76% on nrb.  EMS reports o2 sat 99% on NRB with ems.  PT alert and oriented at this time.  PT has a cough.  EMS says pt did not want to come to ED.  Denies pain.

## 2018-10-15 NOTE — H&P (Signed)
History and Physical:    [name] Rice   WUJ:[id number] DOB: 03/16/1936 DOA: 10/15/2018  Referring MD/provider: Dr. Juleen ChinaKohut PCP: Benita StabileHall, John Z, MD   Patient coming from: Home  Chief Complaint: Patient sent from nursing home for cyanosis and fever  History of Present Illness:   [name] Rice is an 83 y.o. male with past medical history significant for CAD, COPD, HTN, CKD, atrial fibrillation who lives in a nursing home.  Patient apparently was noted to be cyanotic earlier today.  He himself denies any complaints.  He states he does have an intermittent cough but it does not bother him much.  Patient does not believe that he is short of breath.  Patient denies any fevers or chills or feeling unwell.  It is however difficult to communicate with the patient as he is very hard of hearing.  ED Course:  The patient was noted to have oxygen saturations of 76% on room air by EMT.  He responded well to oxygen therapy.  In the ED he was febrile to 101.6 and chest x-ray was notable for bilateral airspace opacities.  COVID test is negative.  ROS:   ROS   Review of Systems: Limited due to patient being very hard of hearing.  Past Medical History:   Past Medical History:  Diagnosis Date  . Atrial fibrillation (HCC)   . Bradycardia   . CKD (chronic kidney disease), stage III (HCC)    Has had temporary dialysis in the past  . COPD (chronic obstructive pulmonary disease) (HCC)   . Coronary atherosclerosis of native coronary artery    Nonobstructive  . DJD (degenerative joint disease)   . Essential hypertension, benign   . GERD (gastroesophageal reflux disease)   . HOH (hard of hearing)   . Lower extremity edema   . Mitral regurgitation    Moderate  . NICM (nonischemic cardiomyopathy) (HCC)    LVEF 55% 3/11  . Paroxysmal atrial fibrillation (HCC)   . Pulmonary hypertension (HCC)   . Shingles   . Tricuspid regurgitation   . Ventricular dysfunction, right     Past Surgical History:    Past Surgical History:  Procedure Laterality Date  . CATARACT EXTRACTION W/ INTRAOCULAR LENS IMPLANT     right  . DIALYSIS FISTULA CREATION     left forearm  . TOTAL HIP ARTHROPLASTY  ~ 2005   left    Social History:   Social History   Socioeconomic History  . Marital status: Single    Spouse name: Not on file  . Number of children: Not on file  . Years of education: Not on file  . Highest education level: Not on file  Occupational History  . Not on file  Social Needs  . Financial resource strain: Not on file  . Food insecurity    Worry: Not on file    Inability: Not on file  . Transportation needs    Medical: Not on file    Non-medical: Not on file  Tobacco Use  . Smoking status: Former Smoker    Packs/day: 1.00    Years: 40.00    Pack years: 40.00    Types: Cigarettes    Start date: 03/19/1961    Quit date: 03/19/2001    Years since quitting: 17.5  . Smokeless tobacco: Never Used  Substance and Sexual Activity  . Alcohol use: Yes    Alcohol/week: 0.0 standard drinks    Comment: 08/23/11 "used to drink beer q once in  awhile; none in 15-20 years"  . Drug use: No  . Sexual activity: Yes  Lifestyle  . Physical activity    Days per week: Not on file    Minutes per session: Not on file  . Stress: Not on file  Relationships  . Social Musician on phone: Not on file    Gets together: Not on file    Attends religious service: Not on file    Active member of club or organization: Not on file    Attends meetings of clubs or organizations: Not on file    Relationship status: Not on file  . Intimate partner violence    Fear of current or ex partner: Not on file    Emotionally abused: Not on file    Physically abused: Not on file    Forced sexual activity: Not on file  Other Topics Concern  . Not on file  Social History Narrative  . Not on file    Allergies   Patient has no known allergies.  Family history:   Family History  Problem Relation  Age of Onset  . Cancer Brother     Current Medications:   Prior to Admission medications   Medication Sig Start Date End Date Taking? Authorizing Provider  carvedilol (COREG) 6.25 MG tablet take 1 tablet by mouth twice a day with meals 03/25/15   Jonelle Sidle, MD  cholecalciferol (VITAMIN D) 1000 units tablet Take 1,000 Units by mouth daily.    [provider]  docusate sodium (COLACE) 100 MG capsule Take 100 mg by mouth 2 (two) times daily.    [provider]  guaiFENesin (MUCINEX) 600 MG 12 hr tablet Take 600 mg by mouth 2 (two) times daily.    [provider]  hydrocortisone cream 1 % Apply 1 application topically 2 (two) times daily.    [provider]  ipratropium-albuterol (DUONEB) 0.5-2.5 (3) MG/3ML SOLN Take 3 mLs by nebulization every 6 (six) hours as needed.     [provider]  iron polysaccharides (NU-IRON) 150 MG capsule Take 150 mg by mouth 2 (two) times daily.    [provider]  mirtazapine (REMERON) 7.5 MG tablet Take 15 mg by mouth at bedtime.     [provider]  omeprazole (PRILOSEC) 20 MG capsule Take 1 capsule by mouth 2 (two) times daily. 09/07/16   [provider]  senna (SENOKOT) 8.6 MG TABS tablet Take 1 tablet by mouth daily.    [provider]  tiotropium (SPIRIVA) 18 MCG inhalation capsule Place 18 mcg into inhaler and inhale daily.     [provider]    Physical Exam:   Vitals:   10/15/18 1700 10/15/18 1800 10/15/18 1900 10/15/18 1917  BP: 123/73 123/73 108/75   Pulse: 99 66 (!) 109   Resp: (!) 23 (!) 24 (!) 22   Temp:    98.4 F (36.9 C)  TempSrc:    Oral  SpO2: 97% 95% 94%   Weight:      Height:         Physical Exam: Blood pressure 108/75, pulse (!) 109, temperature 98.4 F (36.9 C), temperature source Oral, resp. rate (!) 22, height 5\' 8"  (1.727 m), weight 65.3 kg, SpO2 94 %. Gen: Chronically ill-appearing patient who is lying in bed curled on his  right side in no acute respiratory distress.  He does have oxygen via nasal cannula in place. Eyes: Sclerae anicteric. Conjunctiva mildly injected.  Chest: Decreased air entry bilaterally with rales 1/2 up bilaterally.   CV: Distant, regular, 2/6 systolic murmur without radiation  abdomen: NABS, soft, nondistended, nontender.  Extremities: Patient does seem to have contractures of his lower extremities as it is very difficult to straighten his legs out.  He also has left upper extremity hemiparesis with contractures there as well. Skin: Patient has 1-2+ edema with bilateral circumferential stasis dermatitis and some hyperkeratosis and mild skin breakdown in lower extremities bilaterally.   Data Review:    Labs: Basic Metabolic Panel: Recent Labs  Lab 10/15/18 1511  NA 136  K 4.0  CL 106  CO2 22  GLUCOSE 166*  BUN 35*  CREATININE 1.42*  CALCIUM 8.8*   Liver Function Tests: No results for input(s): AST, ALT, ALKPHOS, BILITOT, PROT, ALBUMIN in the last 168 hours. No results for input(s): LIPASE, AMYLASE in the last 168 hours. No results for input(s): AMMONIA in the last 168 hours. CBC: Recent Labs  Lab 10/15/18 1511  WBC 10.4  NEUTROABS 8.0*  HGB 12.2*  HCT 38.8*  MCV 99.2  PLT 203   Cardiac Enzymes: No results for input(s): CKTOTAL, CKMB, CKMBINDEX, TROPONINI in the last 168 hours.  BNP (last 3 results) No results for input(s): PROBNP in the last 8760 hours. CBG: No results for input(s): GLUCAP in the last 168 hours.  Urinalysis    Component Value Date/Time   COLORURINE YELLOW 01/11/2008 0146   APPEARANCEUR CLEAR 01/11/2008 0146   LABSPEC 1.025 01/11/2008 0146   PHURINE 5.5 01/11/2008 0146   GLUCOSEU NEGATIVE 01/11/2008 0146   HGBUR NEGATIVE 01/11/2008 0146   BILIRUBINUR NEGATIVE 01/11/2008 0146   KETONESUR TRACE (A) 01/11/2008 0146   PROTEINUR 30 (A) 01/11/2008 0146   UROBILINOGEN 0.2 01/11/2008 0146   NITRITE POSITIVE (A) 01/11/2008 0146   LEUKOCYTESUR  TRACE (A) 01/11/2008 0146      Radiographic Studies: Dg Chest Portable 1 View  Result Date: 10/15/2018 CLINICAL DATA:  Dyspnea and fever EXAM: PORTABLE CHEST 1 VIEW COMPARISON:  August 23, 2011 FINDINGS: There is hazy airspace opacity seen throughout the left lung and right lung base, however limited due to technique. There is cardiomegaly. IMPRESSION: Hazy airspace opacity throughout the left lung and right lung base, The findings in the lungs are nonspecific, but concerning for infection Electronically Signed   By: Prudencio Pair M.D.   On: 10/15/2018 16:25      Assessment/Plan:   Principal Problem:   HCAP (healthcare-associated pneumonia) Active Problems:   Essential hypertension, benign   CORONARY ATHEROSCLEROSIS NATIVE CORONARY ARTERY   NICM (nonischemic cardiomyopathy) (HCC)   CKD (chronic kidney disease) stage 3, GFR 30-59 ml/min (HCC)   HOH (hard of hearing)   COPD (chronic obstructive pulmonary disease) ([place])  83 year old man who lives in a nursing home and appears to have flexion contractures presents with hypoxia and CAP.  CAP Febrile patient with hypoxia, mild leukocytosis and infiltrates on chest x-ray presenting from nursing home.  Patient himself states he feels comfortable.  He thinks he might have a new cough but is not sure. We will continue treatment with ceftriaxone and azithromycin as initiated in the ED.  STASIS DERMATITIS We will ask wound consult to assist with care of circumferential stasis dermatitis with skin breakdown.  ABNORMAL UA Patient likely has asymptomatic bacteriuria however the ceftriaxone will cover this as well.  FLEXION CONTRACTURES I do not believe it will be helpful to contact physical therapy for his flexion contractures as they are clearly chronic.  COPD I do not hear any wheezes, no evidence for acute flare Continue Spiriva as well as DuoNeb's per home doses  HTN Continue carvedilol  ANXIETY AND DEPRESSION Continue mirtazapine   GERD Continue PPI     Other information:   DVT prophylaxis: Lovenox ordered. Code Status: Full code. Family Communication: Nursing home contacted family per ED RN Disposition Plan: Nursing home Consults called: None Admission status: Inpatient   The medical decision making is of moderate complexity, therefore this is a level 2 visit.  Horatio PelSrobona Tublu  [facility]  If 7PM-7AM, please contact night-coverage [email or web address] Password [email or web address] 10/15/2018, 9:12 PM

## 2018-10-15 NOTE — ED Notes (Signed)
ED TO INPATIENT HANDOFF REPORT  ED Nurse Name and Phone #: [phone]  S Name/Age/Gender [name] Rice 83 y.o. male Room/Bed: APA05/APA05  Code Status   Code Status: Prior  Home/SNF/Other Skilled nursing facility Patient oriented to: situation Is this baseline? Yes   Triage Complete: Triage complete  Chief Complaint .  Triage Note [facility] staff reports pt went to PT today and when he came back he was cyanotic around lips and on hands, sob, congested, and o2 sat 76% on nrb.  EMS reports o2 sat 99% on NRB with ems.  PT alert and oriented at this time.  PT has a cough.  EMS says pt did not want to come to ED.  Denies pain.   Allergies No Known Allergies  Level of Care/Admitting Diagnosis ED Disposition    ED Disposition Condition Comment   Admit  Hospital Area: [facility] [[id number]]  Level of Care: Med-Surg [16]  Covid Evaluation: Confirmed COVID Negative  Diagnosis: HCAP (healthcare-associated pneumonia) [030092]  Admitting Physician: Pieter Partridge [[id number]]  Attending Physician: Pieter Partridge [[id number]]  Estimated length of stay: past midnight tomorrow  Certification:: I certify this patient will need inpatient services for at least 2 midnights  PT Class (Do Not Modify): Inpatient [101]  PT Acc Code (Do Not Modify): Private [1]       B Medical/Surgery History Past Medical History:  Diagnosis Date  . Atrial fibrillation (HCC)   . Bradycardia   . CKD (chronic kidney disease), stage III (HCC)    Has had temporary dialysis in the past  . COPD (chronic obstructive pulmonary disease) (HCC)   . Coronary atherosclerosis of native coronary artery    Nonobstructive  . DJD (degenerative joint disease)   . Essential hypertension, benign   . GERD (gastroesophageal reflux disease)   . HOH (hard of hearing)   . Lower extremity edema   . Mitral regurgitation    Moderate  . NICM (nonischemic cardiomyopathy) (HCC)    LVEF 55% 3/11  .  Paroxysmal atrial fibrillation (HCC)   . Pulmonary hypertension (HCC)   . Shingles   . Tricuspid regurgitation   . Ventricular dysfunction, right    Past Surgical History:  Procedure Laterality Date  . CATARACT EXTRACTION W/ INTRAOCULAR LENS IMPLANT     right  . DIALYSIS FISTULA CREATION     left forearm  . TOTAL HIP ARTHROPLASTY  ~ 2005   left     A IV Location/Drains/Wounds Patient Lines/Drains/Airways Status   Active Line/Drains/Airways    Name:   Placement date:   Placement time:   Site:   Days:   Peripheral IV 10/15/18 Right Antecubital   10/15/18    1715    Antecubital   less than 1   Vascular Access Left Upper arm Arteriovenous fistula   -    -    Upper arm      Wound / Incision (Open or Dehisced) 06/20/15 Toe (Comment  which one) Right entire dorsal aspect    06/20/15    1036    Toe (Comment  which one)   1213   Wound / Incision (Open or Dehisced) 07/28/15 Other (Comment) Leg Right;Lateral;Upper weeping blister    07/28/15    1443    Leg   1175          Intake/Output Last 24 hours No intake or output data in the 24 hours ending 10/15/18 1919  Labs/Imaging Results for orders placed or performed during the hospital  encounter of 10/15/18 (from the past 48 hour(s))  CBC with Differential     Status: Abnormal   Collection Time: 10/15/18  3:11 PM  Result Value Ref Range   WBC 10.4 4.0 - 10.5 K/uL   RBC 3.91 (L) 4.22 - 5.81 MIL/uL   Hemoglobin 12.2 (L) 13.0 - 17.0 g/dL   HCT 54.038.8 (L) 98.139.0 - 19.152.0 %   MCV 99.2 80.0 - 100.0 fL   MCH 31.2 26.0 - 34.0 pg   MCHC 31.4 30.0 - 36.0 g/dL   RDW 47.815.3 29.511.5 - 62.115.5 %   Platelets 203 150 - 400 K/uL   nRBC 0.0 0.0 - 0.2 %   Neutrophils Relative % 77 %   Neutro Abs 8.0 (H) 1.7 - 7.7 K/uL   Lymphocytes Relative 12 %   Lymphs Abs 1.3 0.7 - 4.0 K/uL   Monocytes Relative 8 %   Monocytes Absolute 0.8 0.1 - 1.0 K/uL   Eosinophils Relative 2 %   Eosinophils Absolute 0.2 0.0 - 0.5 K/uL   Basophils Relative 0 %   Basophils Absolute 0.0  0.0 - 0.1 K/uL   Immature Granulocytes 1 %   Abs Immature Granulocytes 0.12 (H) 0.00 - 0.07 K/uL    Comment: Performed at [facility], [address] [place], [place] [address]  Basic metabolic panel     Status: Abnormal   Collection Time: 10/15/18  3:11 PM  Result Value Ref Range   Sodium 136 135 - 145 mmol/L   Potassium 4.0 3.5 - 5.1 mmol/L   Chloride 106 98 - 111 mmol/L   CO2 22 22 - 32 mmol/L   Glucose, Bld 166 (H) 70 - 99 mg/dL   BUN 35 (H) 8 - 23 mg/dL   Creatinine, Ser 7.841.42 (H) 0.61 - 1.24 mg/dL   Calcium 8.8 (L) 8.9 - 10.3 mg/dL   GFR calc non Af Amer 46 (L) >60 mL/min   GFR calc Af Amer 53 (L) >60 mL/min   Anion gap 8 5 - 15    Comment: Performed at [facility], [address] [place], [place] [address]  Lactic acid, plasma     Status: None   Collection Time: 10/15/18  3:11 PM  Result Value Ref Range   Lactic Acid, Venous 1.9 0.5 - 1.9 mmol/L    Comment: Performed at [facility], [address] [place], [place] [address]  Blood culture (routine x 2)     Status: None (Preliminary result)   Collection Time: 10/15/18  3:11 PM   Specimen: Vein; Blood  Result Value Ref Range   Specimen Description BLOOD RIGHT ANTECUBITAL    Special Requests      BOTTLES DRAWN AEROBIC AND ANAEROBIC Blood Culture adequate volume Performed at [facility], [address] [place], [place] [address]    Culture PENDING    Report Status PENDING   SARS Coronavirus 2 (CEPHEID- Performed in [facility] hospital lab), Hosp Order     Status: None   Collection Time: 10/15/18  3:34 PM   Specimen: Nasopharyngeal Swab  Result Value Ref Range   SARS Coronavirus 2 NEGATIVE NEGATIVE    Comment: (NOTE) If result is NEGATIVE SARS-CoV-2 target nucleic acids are NOT DETECTED. The SARS-CoV-2 RNA is generally detectable in upper and lower  respiratory specimens during the acute phase of infection. The lowest  concentration of SARS-CoV-2 viral copies this assay can detect is 250  copies / mL. A negative  result does not preclude SARS-CoV-2 infection  and should not  be used as the sole basis for treatment or other  patient management decisions.  A negative result may occur with  improper specimen collection / handling, submission of specimen other  than nasopharyngeal swab, presence of viral mutation(s) within the  areas targeted by this assay, and inadequate number of viral copies  (<250 copies / mL). A negative result must be combined with clinical  observations, patient history, and epidemiological information. If result is POSITIVE SARS-CoV-2 target nucleic acids are DETECTED. The SARS-CoV-2 RNA is generally detectable in upper and lower  respiratory specimens dur ing the acute phase of infection.  Positive  results are indicative of active infection with SARS-CoV-2.  Clinical  correlation with patient history and other diagnostic information is  necessary to determine patient infection status.  Positive results do  not rule out bacterial infection or co-infection with other viruses. If result is PRESUMPTIVE POSTIVE SARS-CoV-2 nucleic acids MAY BE PRESENT.   A presumptive positive result was obtained on the submitted specimen  and confirmed on repeat testing.  While 2019 novel coronavirus  (SARS-CoV-2) nucleic acids may be present in the submitted sample  additional confirmatory testing may be necessary for epidemiological  and / or clinical management purposes  to differentiate between  SARS-CoV-2 and other Sarbecovirus currently known to infect humans.  If clinically indicated additional testing with an alternate test  methodology [phone]) is advised. The SARS-CoV-2 RNA is generally  detectable in upper and lower respiratory sp ecimens during the acute  phase of infection. The expected result is Negative. Fact Sheet for Patients:  StrictlyIdeas.no Fact Sheet for Healthcare Providers: BankingDealers.co.za This test is not yet  approved or cleared by the [place] FDA and has been authorized for detection and/or diagnosis of SARS-CoV-2 by FDA under an Emergency Use Authorization (EUA).  This EUA will remain in effect (meaning this test can be used) for the duration of the COVID-19 declaration under Section 564(b)(1) of the Act, 21 U.S.C. section 360bbb-3(b)(1), unless the authorization is terminated or revoked sooner. Performed at [facility], [address], [place], [place] [address]    Dg Chest Portable 1 View  Result Date: 10/15/2018 CLINICAL DATA:  Dyspnea and fever EXAM: PORTABLE CHEST 1 VIEW COMPARISON:  August 23, 2011 FINDINGS: There is hazy airspace opacity seen throughout the left lung and right lung base, however limited due to technique. There is cardiomegaly. IMPRESSION: Hazy airspace opacity throughout the left lung and right lung base, The findings in the lungs are nonspecific, but concerning for infection Electronically Signed   By: Prudencio Pair M.D.   On: 10/15/2018 16:25    Pending Labs Unresulted Labs (From admission, onward)    Start     Ordered   10/15/18 1535  Urinalysis, Routine w reflex microscopic  Once,   STAT     10/15/18 1534          Vitals/Pain Today's Vitals   10/15/18 1800 10/15/18 1900 10/15/18 1916 10/15/18 1917  BP: 123/73 108/75    Pulse: 66 (!) 109    Resp: (!) 24 (!) 22    Temp:    98.4 F (36.9 C)  TempSrc:    Oral  SpO2: 95% 94%    Weight:      Height:      PainSc:   0-No pain     Isolation Precautions Airborne and Contact precautions  Medications Medications  azithromycin (ZITHROMAX) 500 mg in sodium chloride 0.9 % 250 mL IVPB (500 mg Intravenous New Bag/Given 10/15/18 1850)  acetaminophen (TYLENOL) tablet 650 mg (650 mg Oral Given 10/15/18 1555)  cefTRIAXone (ROCEPHIN) 1 g in sodium chloride 0.9 % 100 mL IVPB (0 g Intravenous Stopped 10/15/18 1916)  acetaminophen (TYLENOL) tablet 650 mg (650 mg Oral Given 10/15/18 1802)    Mobility non-ambulatory      Focused Assessments Pulmonary Assessment Handoff:  Lung sounds: Bilateral Breath Sounds: Diminished O2 Device: Room Air        R Recommendations: See Admitting Provider Note  Report given to:   Additional Notes: Pt is contracted, but is alert and oriented.

## 2018-10-15 NOTE — ED Provider Notes (Signed)
[name]   CSN: [id number] Arrival date & time: 10/15/18  1443     History   Chief Complaint Chief Complaint  Patient presents with  . Nasal Congestion  . decreased o2 sat    HPI [name] Rice is a 83 y.o. male.     HPI   83 year old male transferred from his nursing facility for evaluation of hypoxemia.  Apparently he came back from physical therapy today and appeared to be cyanotic.  O2 sats were checked and 76% on room air.  Patient is not a very good historian and offers no complaints.  When specifically asked though he endorses a cough and feeling cold. He cannot provide me a timeline of symptom duration.   Past hx of NICM, Non-obstructive CAD, COPD, HTN, GERD, CKD, afib, DJD. Doesn't appear to be on oxygen normally. Independent eating but needs assistance with bathing, dressing and transfer. Ambulates with walker. FULL CODE.   Past Medical History:  Diagnosis Date  . Atrial fibrillation ([place])   . Bradycardia   . CKD (chronic kidney disease), stage III (HCC)    Has had temporary dialysis in the past  . COPD (chronic obstructive pulmonary disease) ([place])   . Coronary atherosclerosis of native coronary artery    Nonobstructive  . DJD (degenerative joint disease)   . Essential hypertension, benign   . GERD (gastroesophageal reflux disease)   . HOH (hard of hearing)   . Lower extremity edema   . Mitral regurgitation    Moderate  . NICM (nonischemic cardiomyopathy) ([place])    LVEF 55% 3/11  . Paroxysmal atrial fibrillation (HCC)   . Pulmonary hypertension ([place])   . Shingles   . Tricuspid regurgitation   . Ventricular dysfunction, right     Patient Active Problem List   Diagnosis Date Noted  . Edema 04/02/2012  . CKD (chronic kidney disease) stage 3, GFR 30-59 ml/min (HCC) 02/17/2009  . HYPERLIPIDEMIA 08/20/2008  . Essential hypertension, benign 08/20/2008  . CORONARY ATHEROSCLEROSIS NATIVE CORONARY ARTERY 08/20/2008  .  Secondary cardiomyopathy ([place]) 08/20/2008    Past Surgical History:  Procedure Laterality Date  . CATARACT EXTRACTION W/ INTRAOCULAR LENS IMPLANT     right  . DIALYSIS FISTULA CREATION     left forearm  . TOTAL HIP ARTHROPLASTY  ~ 2005   left        Home Medications    Prior to Admission medications   Medication Sig Start Date End Date Taking? Authorizing Provider  carvedilol (COREG) 6.25 MG tablet take 1 tablet by mouth twice a day with meals 03/25/15   Satira Sark, MD  cholecalciferol (VITAMIN D) 1000 units tablet Take 1,000 Units by mouth daily.    [provider]  docusate sodium (COLACE) 100 MG capsule Take 100 mg by mouth 2 (two) times daily.    [provider]  guaiFENesin (MUCINEX) 600 MG 12 hr tablet Take 600 mg by mouth 2 (two) times daily.    [provider]  hydrocortisone cream 1 % Apply 1 application topically 2 (two) times daily.    [provider]  ipratropium-albuterol (DUONEB) 0.5-2.5 (3) MG/3ML SOLN Take 3 mLs by nebulization every 6 (six) hours as needed.     [provider]  iron polysaccharides (NU-IRON) 150 MG capsule Take 150 mg by mouth 2 (two) times daily.    [provider]  mirtazapine (REMERON) 7.5 MG tablet Take 15 mg by mouth at bedtime.     [provider]  omeprazole (PRILOSEC) 20 MG capsule Take 1 capsule by mouth 2 (two) times daily. 09/07/16   [provider]  senna (SENOKOT) 8.6 MG TABS tablet Take 1 tablet by mouth daily.    [provider]  tiotropium (SPIRIVA) 18 MCG inhalation capsule Place 18 mcg into inhaler and inhale daily.     [provider]    Family History Family History  Problem Relation Age of Onset  . Cancer Brother     Social History Social History   Tobacco Use  . Smoking status: Former Smoker    Packs/day: 1.00    Years: 40.00    Pack years: 40.00    Types: Cigarettes    Start date: 03/19/1961    Quit date: 03/19/2001     Years since quitting: 17.5  . Smokeless tobacco: Never Used  Substance Use Topics  . Alcohol use: Yes    Alcohol/week: 0.0 standard drinks    Comment: 08/23/11 "used to drink beer q once in awhile; none in 15-20 years"  . Drug use: No     Allergies   Patient has no known allergies.   Review of Systems Review of Systems  All systems reviewed and negative, other than as noted in HPI.  Physical Exam Updated Vital Signs BP 121/86   Pulse 97   Temp (!) 101.2 F (38.4 C) (Rectal)   Resp (!) 25   Ht 5\' 8"  (1.727 m)   Wt 65.3 kg   SpO2 93%   BMI 21.89 kg/m   Physical Exam Vitals signs and nursing note reviewed.  Constitutional:      General: He is not in acute distress.    Appearance: He is well-developed.  HENT:     Head: Normocephalic and atraumatic.  Eyes:     General:        Right eye: No discharge.        Left eye: No discharge.     Conjunctiva/sclera: Conjunctivae normal.  Neck:     Musculoskeletal: Neck supple.  Cardiovascular:     Rate and Rhythm: Tachycardia present. Rhythm irregular.     Heart sounds: Normal heart sounds. No murmur. No friction rub. No gallop.      Comments: irreg irreg Pulmonary:     Effort: Pulmonary effort is normal. No respiratory distress.     Breath sounds: Normal breath sounds.  Abdominal:     General: There is no distension.     Palpations: Abdomen is soft.     Tenderness: There is no abdominal tenderness.  Musculoskeletal:     Comments: Symmetric chronic appearing skin changes to b/l shins, likely stasis dermatitis.   Skin:    General: Skin is warm and dry.  Neurological:     Mental Status: He is alert.  Psychiatric:        Behavior: Behavior normal.        Thought Content: Thought content normal.      ED Treatments / Results  Labs (all labs ordered are listed, but only abnormal results are displayed) Labs Reviewed  CBC WITH DIFFERENTIAL/PLATELET - Abnormal; Notable for the following components:      Result Value    RBC 3.91 (*)    Hemoglobin 12.2 (*)    HCT 38.8 (*)    Neutro Abs 8.0 (*)    Abs Immature Granulocytes 0.12 (*)    All other components within normal limits  BASIC METABOLIC PANEL - Abnormal; Notable for the following components:   Glucose,  Bld 166 (*)    BUN 35 (*)    Creatinine, Ser 1.42 (*)    Calcium 8.8 (*)    GFR calc non Af Amer 46 (*)    GFR calc Af Amer 53 (*)    All other components within normal limits  SARS CORONAVIRUS 2 (HOSPITAL ORDER, PERFORMED IN [facility] LAB)  CULTURE, BLOOD (ROUTINE X 2)  CULTURE, BLOOD (ROUTINE X 2)  LACTIC ACID, PLASMA  URINALYSIS, ROUTINE W REFLEX MICROSCOPIC    EKG None  Radiology Dg Chest Portable 1 View  Result Date: 10/15/2018 CLINICAL DATA:  Dyspnea and fever EXAM: PORTABLE CHEST 1 VIEW COMPARISON:  August 23, 2011 FINDINGS: There is hazy airspace opacity seen throughout the left lung and right lung base, however limited due to technique. There is cardiomegaly. IMPRESSION: Hazy airspace opacity throughout the left lung and right lung base, The findings in the lungs are nonspecific, but concerning for infection Electronically Signed   By: Jonna Clark M.D.   On: 10/15/2018 16:25    Procedures Procedures (including critical care time)  Medications Ordered in ED Medications  cefTRIAXone (ROCEPHIN) 1 g in sodium chloride 0.9 % 100 mL IVPB (has no administration in time range)  azithromycin (ZITHROMAX) 500 mg in sodium chloride 0.9 % 250 mL IVPB (has no administration in time range)  acetaminophen (TYLENOL) tablet 650 mg (650 mg Oral Given 10/15/18 1555)     Initial Impression / Assessment and Plan / ED Course  I have reviewed the triage vital signs and the nursing notes.  Pertinent labs & imaging results that were available during my care of the patient were reviewed by me and considered in my medical decision making (see chart for details).    83yM with hypoxemia. Febrile. CXR with bibasilar opacities. COVID engative. Will  cover for pneumonia   Final Clinical Impressions(s) / ED Diagnoses   Final diagnoses:  Community acquired pneumonia, unspecified laterality    ED Discharge Orders    None       Raeford Razor, MD 10/16/18 0020

## 2018-10-15 NOTE — ED Notes (Signed)
[facility] called and requested updated. Told pt was to be admitted

## 2018-10-16 LAB — MRSA PCR SCREENING: MRSA by PCR: NEGATIVE

## 2018-10-16 LAB — CBC
HCT: 37.9 % — ABNORMAL LOW (ref 39.0–52.0)
Hemoglobin: 11.7 g/dL — ABNORMAL LOW (ref 13.0–17.0)
MCH: 31 pg (ref 26.0–34.0)
MCHC: 30.9 g/dL (ref 30.0–36.0)
MCV: 100.5 fL — ABNORMAL HIGH (ref 80.0–100.0)
Platelets: 177 10*3/uL (ref 150–400)
RBC: 3.77 MIL/uL — ABNORMAL LOW (ref 4.22–5.81)
RDW: 15.4 % (ref 11.5–15.5)
WBC: 9 10*3/uL (ref 4.0–10.5)
nRBC: 0 % (ref 0.0–0.2)

## 2018-10-16 LAB — BASIC METABOLIC PANEL
Anion gap: 9 (ref 5–15)
BUN: 32 mg/dL — ABNORMAL HIGH (ref 8–23)
CO2: 23 mmol/L (ref 22–32)
Calcium: 8.4 mg/dL — ABNORMAL LOW (ref 8.9–10.3)
Chloride: 108 mmol/L (ref 98–111)
Creatinine, Ser: 1.29 mg/dL — ABNORMAL HIGH (ref 0.61–1.24)
GFR calc Af Amer: 60 mL/min — ABNORMAL LOW (ref >60)
GFR calc non Af Amer: 52 mL/min — ABNORMAL LOW (ref >60)
Glucose, Bld: 103 mg/dL — ABNORMAL HIGH (ref 70–99)
Potassium: 3.6 mmol/L (ref 3.5–5.1)
Sodium: 140 mmol/L (ref 135–145)

## 2018-10-16 MED ORDER — UMECLIDINIUM BROMIDE 62.5 MCG/INH IN AEPB
1.0000 | INHALATION_SPRAY | Freq: Every day | RESPIRATORY_TRACT | Status: DC
  Administered 2018-10-16: 1 via RESPIRATORY_TRACT
  Filled 2018-10-16: qty 7

## 2018-10-16 MED ORDER — HYDROCERIN EX CREA
TOPICAL_CREAM | Freq: Every day | CUTANEOUS | Status: DC
  Administered 2018-10-17: 17:00:00 via TOPICAL
  Filled 2018-10-16: qty 113

## 2018-10-16 MED ORDER — POLYETHYLENE GLYCOL 3350 17 G PO PACK
17.0000 g | PACK | Freq: Every day | ORAL | Status: DC
  Administered 2018-10-16: 17 g via ORAL
  Filled 2018-10-16 (×2): qty 1

## 2018-10-16 MED ORDER — POLYVINYL ALCOHOL 1.4 % OP SOLN: 1.0000 [drp] | OPHTHALMIC | Status: DC | PRN

## 2018-10-16 NOTE — Consult Note (Addendum)
[facility] Nurse wound consult note Reason for Consult: Consult requested for bilat legs.  [facility] team is performing consults remotely today; assessed progress notes and performed remote camera assessment with assistance from the bedside nurse.  Wound type: Bilat heels red and nonblanching with intact skin, approx 3X3cm affected ares of stage 1 pressure ijuries Pressure Injury POA: Yes Bilat calves with dark purple-reddish skin; appearance consistent with venous stasis changes.  No open wounds or drainage, scattered areas of dry scabbed skin. Dressing procedure/placement/frequency: Orders provided for bedside nurses to perform; float heels to reduce pressure.  Foam dressings to bilat heels to protect from further injury.  Eucerin cream to promote healing to dry scabbed areas on bilat legs. Please re-consult if further assistance is needed.  Thank-you,  Julien Girt MSN, [place], [place], [place], [place]

## 2018-10-16 NOTE — Progress Notes (Signed)
PROGRESS NOTE                                                                                                                                                                                                             Patient Demographics:    [name] Rice, is a 83 y.o. male, DOB - 1935-12-10, ZOX:[id number]  Admit date - 10/15/2018   Admitting Physician Pieter PartridgeSrobona Tublu Chatterjee, MD  Outpatient Primary MD for the patient is Benita StabileHall, John Z, MD  LOS - 1   Chief Complaint  Patient presents with  . Nasal Congestion  . decreased o2 sat       Brief Narrative    83 y.o. male with past medical history significant for CAD, COPD, HTN, CKD, atrial fibrillation who lives in a nursing home.  Patient apparently was noted to be cyanotic earlier today.  He himself denies any complaints.  He states he does have an intermittent cough but it does not bother him much.  Patient does not believe that he is short of breath.  Patient denies any fevers or chills or feeling unwell.  It is however difficult to communicate with the patient as he is very hard of hearing.  ED Course:  The patient was noted to have oxygen saturations of 76% on room air by EMT.  He responded well to oxygen therapy.  In the ED he was febrile to 101.6 and chest x-ray was notable for bilateral airspace opacities.  COVID test is negative.    Subjective:    [name] Napoleonurtis Ettinger with T-max of 101.2 over last 24 hours, he is off oxygen, had some coughing while eating, was seen by SLP .   Assessment  & Plan :    Principal Problem:   HCAP (healthcare-associated pneumonia) Active Problems:   Essential hypertension, benign   CORONARY ATHEROSCLEROSIS NATIVE CORONARY ARTERY   NICM (nonischemic cardiomyopathy) (HCC)   CKD (chronic kidney disease) stage 3, GFR 30-59 ml/min (HCC)   HOH (hard of hearing)   COPD (chronic obstructive pulmonary disease) (HCC)   CAP -Patient was seen with tach at facility, hypoxic  on presentation, requiring oxygen, leukocytosis, and evidence of bibasilar infiltrate. -SLP consulted to rule out aspiration given his opacities at bases bilaterally -Patient febrile 101.2 over last 24 hours, follow-up blood cultures, continue with IV Rocephin and azithromycin. -Encouraged use incentive  spirometry and flutter valve.  COPD -No wheezing, no need for steroids  HTN Continue carvedilol  ANXIETY AND DEPRESSION Continue mirtazapine  GERD Continue PPI    COVID-19 Labs  No results for input(s): DDIMER, FERRITIN, LDH, CRP in the last 72 hours.  Lab Results  Component Value Date   [place] NEGATIVE 10/15/2018     Code Status : Full  Family Communication  : D/W patient  Disposition Plan  : Back to SNF  Barriers For Discharge : remains on IV ABX, changed to follow blood cultures given he was febrile on presentation  Consults  :  None  Procedures  : None  DVT Prophylaxis  :  Lovenox  Lab Results  Component Value Date   PLT 177 10/16/2018    Antibiotics  :    Anti-infectives (From admission, onward)   Start     Dose/Rate Route Frequency Ordered Stop   10/16/18 1730  cefTRIAXone (ROCEPHIN) 1 g in sodium chloride 0.9 % 100 mL IVPB     1 g 200 mL/hr over 30 Minutes Intravenous Every 24 hours 10/15/18 2256     10/15/18 1700  cefTRIAXone (ROCEPHIN) 1 g in sodium chloride 0.9 % 100 mL IVPB     1 g 200 mL/hr over 30 Minutes Intravenous  Once 10/15/18 1655 10/15/18 1916   10/15/18 1700  azithromycin (ZITHROMAX) 500 mg in sodium chloride 0.9 % 250 mL IVPB     500 mg 250 mL/hr over 60 Minutes Intravenous Every 24 hours 10/15/18 1655          Objective:   Vitals:   10/16/18 0845 10/16/18 1110 10/16/18 1112 10/16/18 1138  BP:      Pulse:      Resp:      Temp: 97.7 F (36.5 C)   98 F (36.7 C)  TempSrc: Oral   Axillary  SpO2:  93% 93%   Weight:      Height:        Wt Readings from Last 3 Encounters:  10/15/18 61.8 kg  05/28/17 65.3 kg   11/15/16 64.9 kg     Intake/Output Summary (Last 24 hours) at 10/16/2018 1509 Last data filed at 10/16/2018 0546 Gross per 24 hour  Intake 657.2 ml  Output -  Net 657.2 ml     Physical Exam  Awake Alert,pleasent, No new F.N deficits, Normal affect Symmetrical Chest wall movement, Good air movement bilaterally, CTAB RRR,No Gallops,Rubs or new Murmurs, No Parasternal Heave +ve B.Sounds, Abd Soft, No tenderness, No organomegaly appriciated, No rebound - guarding or rigidity. No Cyanosis, Clubbing or edema, No new Rash or bruise  ,Patient does seem to have contractures of his lower extremities as it is very difficult to straighten his legs out.  He also has left upper extremity hemiparesis with contractures there as well.    Data Review:    CBC Recent Labs  Lab 10/15/18 1511 10/16/18 0442  WBC 10.4 9.0  HGB 12.2* 11.7*  HCT 38.8* 37.9*  PLT 203 177  MCV 99.2 100.5*  MCH 31.2 31.0  MCHC 31.4 30.9  RDW 15.3 15.4  LYMPHSABS 1.3  --   MONOABS 0.8  --   EOSABS 0.2  --   BASOSABS 0.0  --     Chemistries  Recent Labs  Lab 10/15/18 1511 10/16/18 0442  NA 136 140  K 4.0 3.6  CL 106 108  CO2 22 23  GLUCOSE 166* 103*  BUN 35* 32*  CREATININE 1.42* 1.29*  CALCIUM 8.8*  8.4*   ------------------------------------------------------------------------------------------------------------------ No results for input(s): CHOL, HDL, LDLCALC, TRIG, CHOLHDL, LDLDIRECT in the last 72 hours.  Lab Results  Component Value Date   HGBA1C  03/18/2007    6.1 (NOTE)   The ADA recommends the following therapeutic goals for glycemic   control related to Hgb A1C measurement:   Goal of Therapy:   < 7.0% Hgb A1C   Action Suggested:  > 8.0% Hgb A1C   Ref:  Diabetes Care, 22, Suppl. 1, 1999   ------------------------------------------------------------------------------------------------------------------ No results for input(s): TSH, T4TOTAL, T3FREE, THYROIDAB in the last 72 hours.   Invalid input(s): FREET3 ------------------------------------------------------------------------------------------------------------------ No results for input(s): VITAMINB12, FOLATE, FERRITIN, TIBC, IRON, RETICCTPCT in the last 72 hours.  Coagulation profile No results for input(s): INR, PROTIME in the last 168 hours.  No results for input(s): DDIMER in the last 72 hours.  Cardiac Enzymes No results for input(s): CKMB, TROPONINI, MYOGLOBIN in the last 168 hours.  Invalid input(s): CK ------------------------------------------------------------------------------------------------------------------ No results found for: BNP  Inpatient Medications  Scheduled Meds: . aspirin EC  81 mg Oral Daily  . carvedilol  6.25 mg Oral q morning - 10a  . enoxaparin (LOVENOX) injection  40 mg Subcutaneous Q24H  . guaiFENesin  600 mg Oral BID  . hydrocerin   Topical Daily  . ipratropium-albuterol  3 mL Nebulization Daily  . loratadine  10 mg Oral Daily  . mirtazapine  15 mg Oral QHS  . pantoprazole  40 mg Oral Daily  . polyethylene glycol  17 g Oral Daily  . senna-docusate  1 tablet Oral Daily  . umeclidinium bromide  1 puff Inhalation Daily   Continuous Infusions: . sodium chloride 100 mL/hr at 10/16/18 0546  . azithromycin Stopped (10/15/18 1950)  . cefTRIAXone (ROCEPHIN)  IV     PRN Meds:.acetaminophen **OR** acetaminophen, albuterol, docusate sodium, polyvinyl alcohol  Micro Results Recent Results (from the past 240 hour(s))  Blood culture (routine x 2)     Status: None (Preliminary result)   Collection Time: 10/15/18  3:11 PM   Specimen: BLOOD  Result Value Ref Range Status   Specimen Description BLOOD RIGHT ANTECUBITAL  Final   Special Requests   Final    BOTTLES DRAWN AEROBIC AND ANAEROBIC Blood Culture adequate volume   Culture   Final    NO GROWTH < 24 HOURS Performed at [facility], [address] [place], [place] [address]    Report Status PENDING  Incomplete  SARS  Coronavirus 2 (CEPHEID- Performed in [facility] hospital lab), Hosp Order     Status: None   Collection Time: 10/15/18  3:34 PM   Specimen: Nasopharyngeal Swab  Result Value Ref Range Status   SARS Coronavirus 2 NEGATIVE NEGATIVE Final    Comment: (NOTE) If result is NEGATIVE SARS-CoV-2 target nucleic acids are NOT DETECTED. The SARS-CoV-2 RNA is generally detectable in upper and lower  respiratory specimens during the acute phase of infection. The lowest  concentration of SARS-CoV-2 viral copies this assay can detect is 250  copies / mL. A negative result does not preclude SARS-CoV-2 infection  and should not be used as the sole basis for treatment or other  patient management decisions.  A negative result may occur with  improper specimen collection / handling, submission of specimen other  than nasopharyngeal swab, presence of viral mutation(s) within the  areas targeted by this assay, and inadequate number of viral copies  (<250 copies / mL). A negative result must be combined with clinical  observations,  patient history, and epidemiological information. If result is POSITIVE SARS-CoV-2 target nucleic acids are DETECTED. The SARS-CoV-2 RNA is generally detectable in upper and lower  respiratory specimens dur ing the acute phase of infection.  Positive  results are indicative of active infection with SARS-CoV-2.  Clinical  correlation with patient history and other diagnostic information is  necessary to determine patient infection status.  Positive results do  not rule out bacterial infection or co-infection with other viruses. If result is PRESUMPTIVE POSTIVE SARS-CoV-2 nucleic acids MAY BE PRESENT.   A presumptive positive result was obtained on the submitted specimen  and confirmed on repeat testing.  While 2019 novel coronavirus  (SARS-CoV-2) nucleic acids may be present in the submitted sample  additional confirmatory testing may be necessary for epidemiological  and / or  clinical management purposes  to differentiate between  SARS-CoV-2 and other Sarbecovirus currently known to infect humans.  If clinically indicated additional testing with an alternate test  methodology [phone]) is advised. The SARS-CoV-2 RNA is generally  detectable in upper and lower respiratory sp ecimens during the acute  phase of infection. The expected result is Negative. Fact Sheet for Patients:  BoilerBrush.com.cy Fact Sheet for Healthcare Providers: https://pope.com/ This test is not yet approved or cleared by the [place] FDA and has been authorized for detection and/or diagnosis of SARS-CoV-2 by FDA under an Emergency Use Authorization (EUA).  This EUA will remain in effect (meaning this test can be used) for the duration of the COVID-19 declaration under Section 564(b)(1) of the Act, 21 U.S.C. section 360bbb-3(b)(1), unless the authorization is terminated or revoked sooner. Performed at [facility], [address], [place], [place] [address]   MRSA PCR Screening     Status: None   Collection Time: 10/15/18 10:37 PM   Specimen: Nasopharyngeal  Result Value Ref Range Status   MRSA by PCR NEGATIVE NEGATIVE Final    Comment:        The GeneXpert MRSA Assay (FDA approved for NASAL specimens only), is one component of a comprehensive MRSA colonization surveillance program. It is not intended to diagnose MRSA infection nor to guide or monitor treatment for MRSA infections. Performed at [facility], [address] [place], [place] [address]     Radiology Reports Dg Chest Portable 1 View  Result Date: 10/15/2018 CLINICAL DATA:  Dyspnea and fever EXAM: PORTABLE CHEST 1 VIEW COMPARISON:  August 23, 2011 FINDINGS: There is hazy airspace opacity seen throughout the left lung and right lung base, however limited due to technique. There is cardiomegaly. IMPRESSION: Hazy airspace opacity throughout the left lung and right lung  base, The findings in the lungs are nonspecific, but concerning for infection Electronically Signed   By: Jonna Clark M.D.   On: 10/15/2018 16:25      Huey Bienenstock M.D on 10/16/2018 at 3:09 PM  Between 7am to 7pm - Pager - [phone]  After 7pm go to [email or web address] - password [facility]  [facility] -  Office  [phone]

## 2018-10-16 NOTE — Evaluation (Signed)
Clinical/Bedside Swallow Evaluation Patient Details  Name: [name] Rice MRN: [id number] Date of Birth: September 13, 1935  Today's Date: 10/16/2018 Time: SLP Start Time (ACUTE ONLY): 1350 SLP Stop Time (ACUTE ONLY): 1415 SLP Time Calculation (min) (ACUTE ONLY): 25 min  Past Medical History:  Past Medical History:  Diagnosis Date  . Atrial fibrillation (HCC)   . Bradycardia   . CKD (chronic kidney disease), stage III (HCC)    Has had temporary dialysis in the past  . COPD (chronic obstructive pulmonary disease) (HCC)   . Coronary atherosclerosis of native coronary artery    Nonobstructive  . DJD (degenerative joint disease)   . Essential hypertension, benign   . GERD (gastroesophageal reflux disease)   . HOH (hard of hearing)   . Lower extremity edema   . Mitral regurgitation    Moderate  . NICM (nonischemic cardiomyopathy) (HCC)    LVEF 55% 3/11  . Paroxysmal atrial fibrillation (HCC)   . Pulmonary hypertension (HCC)   . Shingles   . Tricuspid regurgitation   . Ventricular dysfunction, right    Past Surgical History:  Past Surgical History:  Procedure Laterality Date  . CATARACT EXTRACTION W/ INTRAOCULAR LENS IMPLANT     right  . DIALYSIS FISTULA CREATION     left forearm  . TOTAL HIP ARTHROPLASTY  ~ 2005   left   HPI:  [name] Rice is an 83 y.o. male with past medical history significant for CAD, COPD, HTN, CKD, atrial fibrillation who lives in a nursing home.  Patient apparently was noted to be cyanotic earlier today.  He himself denies any complaints.  He states he does have an intermittent cough but it does not bother him much.  Patient does not believe that he is short of breath.  Patient denies any fevers or chills or feeling unwell.  It is however difficult to communicate with the patient as he is very hard of hearing. The patient was noted to have oxygen saturations of 76% on room air by EMT.  He responded well to oxygen therapy.  In the ED he was febrile to 101.6 and  chest x-ray was notable for bilateral airspace opacities.  COVID test is negative. BSE requested.   Assessment / Plan / Recommendation Clinical Impression  Clinical swallow evaluation completed at bedside. Pt curled up in the bed and legs are retracted, he was repositioned, but gravitates quickly back to same position. Pt denies h/o stroke and dysphagia. Oral motor examination is remarkable for slight lingual deviation to the right and previous cut through tip of tongue (from childhood per Pt). Pt has loose fitting upper dentures. Pt without overt signs or reduced airway protection during sequential straw sips of thin liquids. He demonstrates prolonged oral phase with solids due to mastication/dentition, however no significant residuals post swallow. Recommend D3/mech soft and thin liquids with po medications whole with water; standard aspiration and reflux precautions. SLP will follow during acute stay.   SLP Visit Diagnosis: Dysphagia, unspecified (R13.10)    Aspiration Risk  Mild aspiration risk    Diet Recommendation Dysphagia 3 (Mech soft);Thin liquid   Liquid Administration via: Cup;Straw Medication Administration: Whole meds with liquid Supervision: Staff to assist with self feeding Compensations: Slow rate Postural Changes: Seated upright at 90 degrees;Remain upright for at least 30 minutes after po intake    Other  Recommendations Oral Care Recommendations: Oral care BID;Staff/trained caregiver to provide oral care Other Recommendations: Clarify dietary restrictions   Follow up Recommendations 24 hour supervision/assistance  Frequency and Duration min 2x/week  1 week       Prognosis Prognosis for Safe Diet Advancement: Fair      Swallow Study   General Date of Onset: 10/15/18 HPI: [name] Rice is an 83 y.o. male with past medical history significant for CAD, COPD, HTN, CKD, atrial fibrillation who lives in a nursing home.  Patient apparently was noted to be cyanotic  earlier today.  He himself denies any complaints.  He states he does have an intermittent cough but it does not bother him much.  Patient does not believe that he is short of breath.  Patient denies any fevers or chills or feeling unwell.  It is however difficult to communicate with the patient as he is very hard of hearing. The patient was noted to have oxygen saturations of 76% on room air by EMT.  He responded well to oxygen therapy.  In the ED he was febrile to 101.6 and chest x-ray was notable for bilateral airspace opacities.  COVID test is negative. BSE requested. Type of Study: Bedside Swallow Evaluation Previous Swallow Assessment: None on record Diet Prior to this Study: Regular;Thin liquids Temperature Spikes Noted: No Respiratory Status: Room air History of Recent Intubation: No Behavior/Cognition: Alert;Cooperative;Pleasant mood Oral Cavity Assessment: Within Functional Limits Oral Care Completed by SLP: Yes Oral Cavity - Dentition: Dentures, top(loose fitting dentures) Vision: Functional for self-feeding Self-Feeding Abilities: Needs assist Patient Positioning: Upright in bed Baseline Vocal Quality: Normal Volitional Cough: Strong Volitional Swallow: Able to elicit    Oral/Motor/Sensory Function Overall Oral Motor/Sensory Function: Mild impairment Facial ROM: Within Functional Limits Facial Symmetry: Within Functional Limits Facial Strength: Within Functional Limits Lingual ROM: Within Functional Limits Lingual Symmetry: Abnormal symmetry right(Pt has a cut in tip of tongue from childhood) Lingual Strength: Within Functional Limits Lingual Sensation: Within Functional Limits Velum: Within Functional Limits Mandible: Within Functional Limits   Ice Chips Ice chips: Within functional limits Presentation: Spoon   Thin Liquid Thin Liquid: Within functional limits Presentation: Cup;Straw    Nectar Thick Nectar Thick Liquid: Not tested   Honey Thick Honey Thick Liquid: Not  tested   Puree Puree: Within functional limits Presentation: Spoon   Solid     Solid: Impaired Presentation: Spoon Oral Phase Impairments: Impaired mastication Oral Phase Functional Implications: Prolonged oral transit     Thank you,  Genene Churn, [place]  Willma Obando 10/16/2018,2:31 PM

## 2018-10-17 MED ORDER — AMOXICILLIN-POT CLAVULANATE 875-125 MG PO TABS: 1.0000 | ORAL_TABLET | Freq: Two times a day (BID) | ORAL | 0 refills | Status: AC

## 2018-10-17 NOTE — Plan of Care (Signed)
  Problem: Clinical Measurements: Goal: Ability to maintain clinical measurements within normal limits will improve 10/17/2018 1642 by Santa Lighter, RN Outcome: Adequate for Discharge 10/17/2018 1641 by Santa Lighter, RN Outcome: Progressing Goal: Will remain free from infection 10/17/2018 1642 by Santa Lighter, RN Outcome: Adequate for Discharge 10/17/2018 1641 by Santa Lighter, RN Outcome: Progressing Goal: Diagnostic test results will improve Outcome: Adequate for Discharge Goal: Respiratory complications will improve Outcome: Adequate for Discharge Goal: Cardiovascular complication will be avoided Outcome: Adequate for Discharge

## 2018-10-17 NOTE — Plan of Care (Signed)
  Problem: Clinical Measurements: Goal: Ability to maintain clinical measurements within normal limits will improve Outcome: Progressing Goal: Will remain free from infection Outcome: Progressing   

## 2018-10-17 NOTE — Discharge Summary (Signed)
[name] Rice, is a 83 y.o. male  DOB 09-04-1935  MRN [id number].  Admission date:  10/15/2018  Admitting Physician  Pieter Partridge, MD  Discharge Date:  10/17/2018   Primary MD  Benita Stabile, MD  Recommendations for primary care physician for things to follow:  - please check CBC, BMP in 1 week   Admission Diagnosis  .   Discharge Diagnosis  .    Principal Problem:   HCAP (healthcare-associated pneumonia) Active Problems:   Essential hypertension, benign   CORONARY ATHEROSCLEROSIS NATIVE CORONARY ARTERY   NICM (nonischemic cardiomyopathy) (HCC)   CKD (chronic kidney disease) stage 3, GFR 30-59 ml/min (HCC)   HOH (hard of hearing)   COPD (chronic obstructive pulmonary disease) (HCC)      Past Medical History:  Diagnosis Date  . Atrial fibrillation (HCC)   . Bradycardia   . CKD (chronic kidney disease), stage III (HCC)    Has had temporary dialysis in the past  . COPD (chronic obstructive pulmonary disease) (HCC)   . Coronary atherosclerosis of native coronary artery    Nonobstructive  . DJD (degenerative joint disease)   . Essential hypertension, benign   . GERD (gastroesophageal reflux disease)   . HOH (hard of hearing)   . Lower extremity edema   . Mitral regurgitation    Moderate  . NICM (nonischemic cardiomyopathy) (HCC)    LVEF 55% 3/11  . Paroxysmal atrial fibrillation (HCC)   . Pulmonary hypertension (HCC)   . Shingles   . Tricuspid regurgitation   . Ventricular dysfunction, right     Past Surgical History:  Procedure Laterality Date  . CATARACT EXTRACTION W/ INTRAOCULAR LENS IMPLANT     right  . DIALYSIS FISTULA CREATION     left forearm  . TOTAL HIP ARTHROPLASTY  ~ 2005   left       History of present illness and  Hospital Course:     Kindly see H&P for history of present illness and admission details, please review complete Labs, Consult reports  and Test reports for all details in brief  HPI  from the history and physical done on the day of admission 10/15/2018  [name] Rice is an 83 y.o. male with past medical history significant for CAD, COPD, HTN, CKD, atrial fibrillation who lives in a nursing home.  Patient apparently was noted to be cyanotic earlier today.  He himself denies any complaints.  He states he does have an intermittent cough but it does not bother him much.  Patient does not believe that he is short of breath.  Patient denies any fevers or chills or feeling unwell.  It is however difficult to communicate with the patient as he is very hard of hearing.  ED Course:  The patient was noted to have oxygen saturations of 76% on room air by EMT.  He responded well to oxygen therapy.  In the ED he was febrile to 101.6 and chest x-ray was notable for bilateral airspace opacities.  COVID test  is negative.    Hospital Course   CAP -Patient was noted to be cyanotic at facility, and was hypoxic on presentation, requiring oxygen initially, with leukocytosis, and evidence of bibasilar infiltrate,.; -He was seen by swallow pathology given bibasilar infiltrate, and questionable aspiration pneumonia, he was mild risk for aspiration . -Febrile on presentation, blood cultures remain negative -Treated with IV Rocephin and IV azithromycin during hospital stay, he will be discharged on Augmentin.  COPD -No wheezing, no need for steroids  HTN Continue carvedilol  ANXIETY AND DEPRESSION Continue mirtazapine  GERD Continue PPI   Discharge Condition:  Stable    Discharge Instructions  and  Discharge Medications     Discharge Instructions    Discharge instructions   Complete by: As directed    Follow with Primary MD Celene Squibb, MD in 7 days   Get CBC, CMP, 2 view Chest X ray checked  by Primary MD next visit.    Activity: As tolerated with Full fall precautions use walker/cane & assistance as needed    Disposition SNF   Diet: Regular diet , with feeding assistance and aspiration precautions.  On your next visit with your primary care physician please Get Medicines reviewed and adjusted.   Please request your Prim.MD to go over all Hospital Tests and Procedure/Radiological results at the follow up, please get all Hospital records sent to your Prim MD by signing hospital release before you go home.   If you experience worsening of your admission symptoms, develop shortness of breath, life threatening emergency, suicidal or homicidal thoughts you must seek medical attention immediately by calling 911 or calling your MD immediately  if symptoms less severe.  You Must read complete instructions/literature along with all the possible adverse reactions/side effects for all the Medicines you take and that have been prescribed to you. Take any new Medicines after you have completely understood and accpet all the possible adverse reactions/side effects.   Do not drive, operating heavy machinery, perform activities at heights, swimming or participation in water activities or provide baby sitting services if your were admitted for syncope or siezures until you have seen by Primary MD or a Neurologist and advised to do so again.  Do not drive when taking Pain medications.    Do not take more than prescribed Pain, Sleep and Anxiety Medications  Special Instructions: If you have smoked or chewed Tobacco  in the last 2 yrs please stop smoking, stop any regular Alcohol  and or any Recreational drug use.  Wear Seat belts while driving.   Please note  You were cared for by a hospitalist during your hospital stay. If you have any questions about your discharge medications or the care you received while you were in the hospital after you are discharged, you can call the unit and asked to speak with the hospitalist on call if the hospitalist that took care of you is not available. Once you are discharged,  your primary care physician will handle any further medical issues. Please note that NO REFILLS for any discharge medications will be authorized once you are discharged, as it is imperative that you return to your primary care physician (or establish a relationship with a primary care physician if you do not have one) for your aftercare needs so that they can reassess your need for medications and monitor your lab values.   Increase activity slowly   Complete by: As directed      Allergies as of 10/17/2018  No Known Allergies     Medication List    TAKE these medications   amoxicillin-clavulanate 875-125 MG tablet Commonly known as: Augmentin Take 1 tablet by mouth 2 (two) times daily for 5 days.   aspirin EC 81 MG tablet Take 81 mg by mouth daily.   carvedilol 6.25 MG tablet Commonly known as: COREG take 1 tablet by mouth twice a day with meals What changed: when to take this   docusate sodium 100 MG capsule Commonly known as: COLACE Take 100 mg by mouth every 12 (twelve) hours as needed for mild constipation.   guaiFENesin 600 MG 12 hr tablet Commonly known as: MUCINEX Take 600 mg by mouth 2 (two) times daily.   ipratropium-albuterol 0.5-2.5 (3) MG/3ML Soln Commonly known as: DUONEB Take 3 mLs by nebulization daily.   loratadine 10 MG tablet Commonly known as: CLARITIN Take 10 mg by mouth daily.   mirtazapine 7.5 MG tablet Commonly known as: REMERON Take 15 mg by mouth at bedtime.   Nu-Iron 150 MG capsule Generic drug: iron polysaccharides Take 150 mg by mouth 2 (two) times daily.   omeprazole 20 MG capsule Commonly known as: PRILOSEC Take 1 capsule by mouth 2 (two) times daily.   polyethylene glycol powder 17 GM/SCOOP powder Commonly known as: GLYCOLAX/MIRALAX Take 17 g by mouth daily.   senna-docusate 8.6-50 MG tablet Commonly known as: Senokot-S Take 1 tablet by mouth daily.   Systane Balance 0.6 % Soln Generic drug: Propylene Glycol Apply 2 drops to  eye 3 (three) times daily.   tiotropium 18 MCG inhalation capsule Commonly known as: SPIRIVA Place 18 mcg into inhaler and inhale daily.         Diet and Activity recommendation: See Discharge Instructions above   Consults obtained -  None   Major procedures and Radiology Reports - PLEASE review detailed and final reports for all details, in brief -      Dg Chest Portable 1 View  Result Date: 10/15/2018 CLINICAL DATA:  Dyspnea and fever EXAM: PORTABLE CHEST 1 VIEW COMPARISON:  August 23, 2011 FINDINGS: There is hazy airspace opacity seen throughout the left lung and right lung base, however limited due to technique. There is cardiomegaly. IMPRESSION: Hazy airspace opacity throughout the left lung and right lung base, The findings in the lungs are nonspecific, but concerning for infection Electronically Signed   By: Jonna ClarkBindu  Avutu M.D.   On: 10/15/2018 16:25    Micro Results    Recent Results (from the past 240 hour(s))  Blood culture (routine x 2)     Status: None (Preliminary result)   Collection Time: 10/15/18  3:11 PM   Specimen: BLOOD  Result Value Ref Range Status   Specimen Description BLOOD RIGHT ANTECUBITAL  Final   Special Requests   Final    BOTTLES DRAWN AEROBIC AND ANAEROBIC Blood Culture adequate volume   Culture   Final    NO GROWTH 2 DAYS Performed at [facility], [address] [place], [place] [address]    Report Status PENDING  Incomplete  SARS Coronavirus 2 (CEPHEID- Performed in [facility] lab), Hosp Order     Status: None   Collection Time: 10/15/18  3:34 PM   Specimen: Nasopharyngeal Swab  Result Value Ref Range Status   SARS Coronavirus 2 NEGATIVE NEGATIVE Final    Comment: (NOTE) If result is NEGATIVE SARS-CoV-2 target nucleic acids are NOT DETECTED. The SARS-CoV-2 RNA is generally detectable in upper and lower  respiratory specimens during  the acute phase of infection. The lowest  concentration of SARS-CoV-2 viral copies this assay  can detect is 250  copies / mL. A negative result does not preclude SARS-CoV-2 infection  and should not be used as the sole basis for treatment or other  patient management decisions.  A negative result may occur with  improper specimen collection / handling, submission of specimen other  than nasopharyngeal swab, presence of viral mutation(s) within the  areas targeted by this assay, and inadequate number of viral copies  (<250 copies / mL). A negative result must be combined with clinical  observations, patient history, and epidemiological information. If result is POSITIVE SARS-CoV-2 target nucleic acids are DETECTED. The SARS-CoV-2 RNA is generally detectable in upper and lower  respiratory specimens dur ing the acute phase of infection.  Positive  results are indicative of active infection with SARS-CoV-2.  Clinical  correlation with patient history and other diagnostic information is  necessary to determine patient infection status.  Positive results do  not rule out bacterial infection or co-infection with other viruses. If result is PRESUMPTIVE POSTIVE SARS-CoV-2 nucleic acids MAY BE PRESENT.   A presumptive positive result was obtained on the submitted specimen  and confirmed on repeat testing.  While 2019 novel coronavirus  (SARS-CoV-2) nucleic acids may be present in the submitted sample  additional confirmatory testing may be necessary for epidemiological  and / or clinical management purposes  to differentiate between  SARS-CoV-2 and other Sarbecovirus currently known to infect humans.  If clinically indicated additional testing with an alternate test  methodology [phone]([id number]) is advised. The SARS-CoV-2 RNA is generally  detectable in upper and lower respiratory sp ecimens during the acute  phase of infection. The expected result is Negative. Fact Sheet for Patients:  BoilerBrush.com.cyhttps://www.fda.gov/media/136312/download Fact Sheet for Healthcare Providers:  https://pope.com/https://www.fda.gov/media/136313/download This test is not yet approved or cleared by the [place] FDA and has been authorized for detection and/or diagnosis of SARS-CoV-2 by FDA under an Emergency Use Authorization (EUA).  This EUA will remain in effect (meaning this test can be used) for the duration of the COVID-19 declaration under Section 564(b)(1) of the Act, 21 U.S.C. section 360bbb-3(b)(1), unless the authorization is terminated or revoked sooner. Performed at [facility], [address] [place], [place] [address]   MRSA PCR Screening     Status: None   Collection Time: 10/15/18 10:37 PM   Specimen: Nasopharyngeal  Result Value Ref Range Status   MRSA by PCR NEGATIVE NEGATIVE Final    Comment:        The GeneXpert MRSA Assay (FDA approved for NASAL specimens only), is one component of a comprehensive MRSA colonization surveillance program. It is not intended to diagnose MRSA infection nor to guide or monitor treatment for MRSA infections. Performed at [facility], [address] [place], [place] [address]        Today   Subjective:   [name] NapoleonCurtis Rice with no significant events overnight as discussed with staff .  Objective:   Blood pressure 109/72, pulse (!) 109, temperature 98 F (36.7 C), temperature source Oral, resp. rate (!) 25, height 5\' 8"  (1.727 m), weight 61.8 kg, SpO2 92 %.   Intake/Output Summary (Last 24 hours) at 10/17/2018 1200 Last data filed at 10/17/2018 0524 Gross per 24 hour  Intake 2249.79 ml  Output 375 ml  Net 1874.79 ml    Exam Patient is sleeping in the bed comfortably, appears he did not want to open his eyes, or follow  commands when asked to, appears comfortable, in no apparent distress Symmetrical Chest wall movement, Good air movement bilaterally, CTAB RRR,No Gallops,Rubs or new Murmurs, No Parasternal Heave +ve B.Sounds, Abd Soft, Non tender,  No rebound -guarding or rigidity. No Cyanosis, Clubbing or edema, No new Rash  or bruise, ,Patient does seem to have contractures of his lower extremities as it is very difficult to straighten his legs out. He also has left upper extremity hemiparesis with contractures there as well  Data Review   CBC w Diff:  Lab Results  Component Value Date   WBC 9.0 10/16/2018   HGB 11.7 (L) 10/16/2018   HCT 37.9 (L) 10/16/2018   PLT 177 10/16/2018   LYMPHOPCT 12 10/15/2018   MONOPCT 8 10/15/2018   EOSPCT 2 10/15/2018   BASOPCT 0 10/15/2018    CMP:  Lab Results  Component Value Date   NA 140 10/16/2018   K 3.6 10/16/2018   CL 108 10/16/2018   CO2 23 10/16/2018   BUN 32 (H) 10/16/2018   CREATININE 1.29 (H) 10/16/2018   CREATININE 1.81 (H) 12/16/2013   PROT 6.2 08/24/2011   ALBUMIN 3.1 (L) 08/24/2011   BILITOT 0.4 08/24/2011   ALKPHOS 64 08/24/2011   AST 11 08/24/2011   ALT 7 08/24/2011  .   Total Time in preparing paper work, data evaluation and todays exam - 35 minutes  Huey Bienenstock M.D on 10/17/2018 at 12:00 PM  [facility]   Office  [phone]

## 2018-10-17 NOTE — Progress Notes (Signed)
Patient refused AM meds. Will continue to try.

## 2018-10-17 NOTE — Progress Notes (Signed)
Nsg Discharge Note  Admit Date:  10/15/2018 Discharge date: 10/17/2018   [name] Rice to be D/C'd Skilled nursing facility per MD order.  AVS completed.  Copy for chart, and copy for patient signed, and dated. Patient/caregiver able to verbalize understanding.  Discharge Medication: Allergies as of 10/17/2018   No Known Allergies     Medication List    TAKE these medications   amoxicillin-clavulanate 875-125 MG tablet Commonly known as: Augmentin Take 1 tablet by mouth 2 (two) times daily for 5 days.   aspirin EC 81 MG tablet Take 81 mg by mouth daily.   carvedilol 6.25 MG tablet Commonly known as: COREG take 1 tablet by mouth twice a day with meals What changed: when to take this   docusate sodium 100 MG capsule Commonly known as: COLACE Take 100 mg by mouth every 12 (twelve) hours as needed for mild constipation.   guaiFENesin 600 MG 12 hr tablet Commonly known as: MUCINEX Take 600 mg by mouth 2 (two) times daily.   ipratropium-albuterol 0.5-2.5 (3) MG/3ML Soln Commonly known as: DUONEB Take 3 mLs by nebulization daily.   loratadine 10 MG tablet Commonly known as: CLARITIN Take 10 mg by mouth daily.   mirtazapine 7.5 MG tablet Commonly known as: REMERON Take 15 mg by mouth at bedtime.   Nu-Iron 150 MG capsule Generic drug: iron polysaccharides Take 150 mg by mouth 2 (two) times daily.   omeprazole 20 MG capsule Commonly known as: PRILOSEC Take 1 capsule by mouth 2 (two) times daily.   polyethylene glycol powder 17 GM/SCOOP powder Commonly known as: GLYCOLAX/MIRALAX Take 17 g by mouth daily.   senna-docusate 8.6-50 MG tablet Commonly known as: Senokot-S Take 1 tablet by mouth daily.   Systane Balance 0.6 % Soln Generic drug: Propylene Glycol Apply 2 drops to eye 3 (three) times daily.   tiotropium 18 MCG inhalation capsule Commonly known as: SPIRIVA Place 18 mcg into inhaler and inhale daily.       Discharge Assessment: Vitals:   10/17/18  0405 10/17/18 0406  BP: 109/72   Pulse:  (!) 109  Resp:    Temp: 98 F (36.7 C)   SpO2:  92%   Skin clean, dry and intact without evidence of skin break down, no evidence of skin tears noted. IV catheter discontinued intact. Site without signs and symptoms of complications - no redness or edema noted at insertion site, patient denies c/o pain - only slight tenderness at site.  Dressing with slight pressure applied.  D/c Instructions-Education: Discharge instructions given to patient/family with verbalized understanding. D/c education completed with patient/family including follow up instructions, medication list, d/c activities limitations if indicated, with other d/c instructions as indicated by MD - patient able to verbalize understanding, all questions fully answered. Patient instructed to return to ED, call 911, or call MD for any changes in condition.  Patient escorted via [place], and D/C home via private auto.  Santa Lighter, RN 10/17/2018 6:24 PM

## 2018-10-17 NOTE — Progress Notes (Signed)
SLP Cancellation Note  Patient Details Name: [name] Rice MRN: [id number] DOB: June 24, 1935   Cancelled treatment:       Reason Eval/Treat Not Completed: Fatigue/lethargy limiting ability to participate;Patient's level of consciousness. Despite max verbal cues to rouse, Pt refused all PO and attempts to wake pt/sit him upright for trials. SLP will re-attempt later today as schedule permits.   Tayten Bergdoll H. Roddie Mc, CCC-SLP Speech Language Pathologist    Wende Bushy 10/17/2018, 10:30 AM

## 2018-10-17 NOTE — Discharge Instructions (Signed)
Follow with Primary MD Celene Squibb, MD in 7 days   Get CBC, CMP, 2 view Chest X ray checked  by Primary MD next visit.    Activity: As tolerated with Full fall precautions use walker/cane & assistance as needed   Disposition SNF   Diet: Regular diet , with feeding assistance and aspiration precautions.  On your next visit with your primary care physician please Get Medicines reviewed and adjusted.   Please request your Prim.MD to go over all Hospital Tests and Procedure/Radiological results at the follow up, please get all Hospital records sent to your Prim MD by signing hospital release before you go home.   If you experience worsening of your admission symptoms, develop shortness of breath, life threatening emergency, suicidal or homicidal thoughts you must seek medical attention immediately by calling 911 or calling your MD immediately  if symptoms less severe.  You Must read complete instructions/literature along with all the possible adverse reactions/side effects for all the Medicines you take and that have been prescribed to you. Take any new Medicines after you have completely understood and accpet all the possible adverse reactions/side effects.   Do not drive, operating heavy machinery, perform activities at heights, swimming or participation in water activities or provide baby sitting services if your were admitted for syncope or siezures until you have seen by Primary MD or a Neurologist and advised to do so again.  Do not drive when taking Pain medications.    Do not take more than prescribed Pain, Sleep and Anxiety Medications  Special Instructions: If you have smoked or chewed Tobacco  in the last 2 yrs please stop smoking, stop any regular Alcohol  and or any Recreational drug use.  Wear Seat belts while driving.   Please note  You were cared for by a hospitalist during your hospital stay. If you have any questions about your discharge medications or the care you  received while you were in the hospital after you are discharged, you can call the unit and asked to speak with the hospitalist on call if the hospitalist that took care of you is not available. Once you are discharged, your primary care physician will handle any further medical issues. Please note that NO REFILLS for any discharge medications will be authorized once you are discharged, as it is imperative that you return to your primary care physician (or establish a relationship with a primary care physician if you do not have one) for your aftercare needs so that they can reassess your need for medications and monitor your lab values.

## 2018-10-17 NOTE — [place] FL2 (Signed)
[place] MEDICAID FL2 LEVEL OF CARE SCREENING TOOL     IDENTIFICATION  Patient Name: [name] Rice Birthdate: 01/07/1936 Sex: male Admission Date (Current Location): 10/15/2018  [facility] and [place] Number:  [employer] and Address:  [facility],  [address] [place] [address]      Provider Number: [id number]  Attending Physician Name and Address:  Elgergawy, Leana Roe, MD  Relative Name and Phone Number:       Current Level of Care: Hospital Recommended Level of Care: Skilled Nursing Facility Prior Approval Number:    Date Approved/Denied:   PASRR Number:    Discharge Plan: SNF    Current Diagnoses: Patient Active Problem List   Diagnosis Date Noted  . HCAP (healthcare-associated pneumonia) 10/15/2018  . HOH (hard of hearing)   . Paroxysmal atrial fibrillation (HCC)   . COPD (chronic obstructive pulmonary disease) (HCC)   . Edema 04/02/2012  . CKD (chronic kidney disease) stage 3, GFR 30-59 ml/min (HCC) 02/17/2009  . HYPERLIPIDEMIA 08/20/2008  . Essential hypertension, benign 08/20/2008  . CORONARY ATHEROSCLEROSIS NATIVE CORONARY ARTERY 08/20/2008  . NICM (nonischemic cardiomyopathy) (HCC) 08/20/2008    Orientation RESPIRATION BLADDER Height & Weight     Self, Time, Situation, Place  Normal Incontinent Weight: 136 lb 3.9 oz (61.8 kg) Height:  5\' 8"  (172.7 cm)  BEHAVIORAL SYMPTOMS/MOOD NEUROLOGICAL BOWEL NUTRITION STATUS      Incontinent Diet(regular)  AMBULATORY STATUS COMMUNICATION OF NEEDS Skin   Total Care(w/c) Verbally PU Stage and Appropriate Care(heel bilateral)                       Personal Care Assistance Level of Assistance  Bathing, Feeding, Dressing Bathing Assistance: Maximum assistance Feeding assistance: Limited assistance Dressing Assistance: Maximum assistance     Functional Limitations Info  Sight, Hearing, Speech Sight Info: Adequate Hearing Info: Adequate Speech Info: Adequate    SPECIAL CARE  FACTORS FREQUENCY                       Contractures Contractures Info: Not present    Additional Factors Info  Code Status, Allergies, Psychotropic Code Status Info: full code Allergies Info: NKA Psychotropic Info: remeron         Current Medications (10/17/2018):  This is the current hospital active medication list Current Facility-Administered Medications  Medication Dose Route Frequency Provider Last Rate Last Dose  . 0.9 %  sodium chloride infusion   Intravenous Continuous Pieter Partridge, MD 100 mL/hr at 10/17/18 0524    . acetaminophen (TYLENOL) tablet 650 mg  650 mg Oral Q6H PRN Pieter Partridge, MD       Or  . acetaminophen (TYLENOL) suppository 650 mg  650 mg Rectal Q6H PRN Leandro Reasoner Tublu, MD      . albuterol (PROVENTIL) (2.5 MG/3ML) 0.083% nebulizer solution 2.5 mg  2.5 mg Nebulization Q2H PRN Pieter Partridge, MD      . aspirin EC tablet 81 mg  81 mg Oral Daily Leandro Reasoner Tublu, MD   81 mg at 10/16/18 0037  . azithromycin (ZITHROMAX) 500 mg in sodium chloride 0.9 % 250 mL IVPB  500 mg Intravenous Q24H Pieter Partridge, MD   Stopped at 10/16/18 1735  . carvedilol (COREG) tablet 6.25 mg  6.25 mg Oral q morning - 10a Leandro Reasoner Tublu, MD   6.25 mg at 10/16/18 0488  . cefTRIAXone (ROCEPHIN) 1 g in sodium chloride 0.9 % 100  mL IVPB  1 g Intravenous Q24H Vashti Hey, MD   Stopped at 10/16/18 1846  . docusate sodium (COLACE) capsule 100 mg  100 mg Oral Q12H PRN Bonnell Public Tublu, MD      . enoxaparin (LOVENOX) injection 40 mg  40 mg Subcutaneous Q24H Bonnell Public Tublu, MD   40 mg at 10/17/18 0000  . guaiFENesin (MUCINEX) 12 hr tablet 600 mg  600 mg Oral BID Bonnell Public Tublu, MD   600 mg at 10/16/18 2120  . hydrocerin (EUCERIN) cream   Topical Daily Elgergawy, Silver Huguenin, MD      . ipratropium-albuterol (DUONEB) 0.5-2.5 (3) MG/3ML nebulizer solution 3 mL  3 mL Nebulization  Daily Bonnell Public Tublu, MD   3 mL at 10/16/18 1110  . loratadine (CLARITIN) tablet 10 mg  10 mg Oral Daily Bonnell Public Tublu, MD   10 mg at 10/16/18 0834  . mirtazapine (REMERON) tablet 15 mg  15 mg Oral QHS Bonnell Public Tublu, MD   15 mg at 10/16/18 2120  . pantoprazole (PROTONIX) EC tablet 40 mg  40 mg Oral Daily Bonnell Public Tublu, MD   40 mg at 10/16/18 0834  . polyethylene glycol (MIRALAX / GLYCOLAX) packet 17 g  17 g Oral Daily Elgergawy, Silver Huguenin, MD   17 g at 10/16/18 0834  . polyvinyl alcohol (LIQUIFILM TEARS) 1.4 % ophthalmic solution 1 drop  1 drop Both Eyes PRN Elgergawy, Silver Huguenin, MD      . senna-docusate (Senokot-S) tablet 1 tablet  1 tablet Oral Daily Bonnell Public Tublu, MD   1 tablet at 10/16/18 [phone]  . umeclidinium bromide (INCRUSE ELLIPTA) 62.5 MCG/INH 1 puff  1 puff Inhalation Daily Elgergawy, Silver Huguenin, MD   1 puff at 10/16/18 1111     Discharge Medications: Please see discharge summary for a list of discharge medications.  Relevant Imaging Results:  Relevant Lab Results:   Additional Information    Jaran Sainz, Clydene Pugh, LCSW

## 2018-10-20 LAB — CULTURE, BLOOD (ROUTINE X 2)
Culture: NO GROWTH
Special Requests: ADEQUATE

## 2018-12-19 ENCOUNTER — Encounter: Payer: Self-pay | Admitting: Cardiology

## 2018-12-19 ENCOUNTER — Telehealth (INDEPENDENT_AMBULATORY_CARE_PROVIDER_SITE_OTHER): Payer: Medicare Other | Admitting: Cardiology

## 2018-12-19 DIAGNOSIS — I5033 Acute on chronic diastolic (congestive) heart failure: Secondary | ICD-10-CM

## 2018-12-19 DIAGNOSIS — N183 Chronic kidney disease, stage 3 unspecified: Secondary | ICD-10-CM | POA: Diagnosis not present

## 2018-12-19 DIAGNOSIS — R601 Generalized edema: Secondary | ICD-10-CM | POA: Diagnosis not present

## 2018-12-19 NOTE — Patient Instructions (Signed)
Your physician recommends that you schedule a follow-up appointment in: [place] PHONE [place] has recommended you make the following change in your medication:   TAKE LASIX 40 MG EVERY OTHER DAY ALTERNATING WITH 20 MG EVERY OTHER DAY   Thank you for choosing [facility]!!

## 2018-12-19 NOTE — Progress Notes (Signed)
Virtual Visit via Telephone Note   This visit type was conducted due to national recommendations for restrictions regarding the COVID-19 Pandemic (e.g. social distancing) in an effort to limit this patient's exposure and mitigate transmission in our community.  Due to his co-morbid illnesses, this patient is at least at moderate risk for complications without adequate follow up.  This format is felt to be most appropriate for this patient at this time.  The patient did not have access to video technology/had technical difficulties with video requiring transitioning to audio format only (telephone).  All issues noted in this document were discussed and addressed.  No physical exam could be performed with this format.  Please refer to the patient's chart for his  consent to telehealth for [facility].   Date:  12/19/2018   ID:  [name] Rice, DOB 1935/12/28, MRN [id number]  Patient Location: Skilled Nursing Facility Provider Location: Office  PCP:  Benita Stabile, MD  Cardiologist:  Nona Dell, MD Electrophysiologist:  None   Evaluation Performed:  Follow-Up Visit  Chief Complaint:   Cardiac follow-up  History of Present Illness:    [name] Rice is an 83 y.o. male not seen in the office since July 2018.  He is a resident of [address] in [place].  Follow-up requested by nurse practitioner at that facility due to reported worsening leg swelling and anasarca with associated weight gain.  I spoke with Selena Batten, a nurse at the facility.  I reviewed available records.  The patient was hospitalized back in July with pneumonia.  Selena Batten tells me that [name] Rice has had a gradual decline, less mobile with associated weight gain and anasarca over a period of several weeks.  His weight went from 150 pounds in June up to 264 pounds now by their scales.  It does not look like he has been on Lasix at all since July.  In the past he had been on diuretics with history of diastolic heart failure, but  he also has CKD stage III.  His last echocardiogram in 2013 revealed LVEF 55 to 60% range.  He was started back on Lasix 40 mg daily by nurse practitioner at the facility about 1 week ago.  Selena Batten tells me that the patient's edema is gradually improving, they have not reweighed him but plan to do so today.  His creatinine has increased from 1.72 at the beginning of July up to 2.37 with BUN 65.  He is also scheduled to have a follow-up with nephrology.   Past Medical History:  Diagnosis Date   Atrial fibrillation (HCC)    Bradycardia    CKD (chronic kidney disease), stage III    Has had temporary dialysis in the past   COPD (chronic obstructive pulmonary disease) (HCC)    Coronary atherosclerosis of native coronary artery    Nonobstructive   DJD (degenerative joint disease)    Essential hypertension    GERD (gastroesophageal reflux disease)    HOH (hard of hearing)    Lower extremity edema    Mitral regurgitation    Moderate   NICM (nonischemic cardiomyopathy) (HCC)    LVEF 55% 3/11   Paroxysmal atrial fibrillation (HCC)    Pulmonary hypertension (HCC)    Shingles    Tricuspid regurgitation    Ventricular dysfunction, right    Past Surgical History:  Procedure Laterality Date   CATARACT EXTRACTION W/ INTRAOCULAR LENS IMPLANT     right   DIALYSIS FISTULA CREATION  left forearm   TOTAL HIP ARTHROPLASTY  ~ 2005   left     Current Meds  Medication Sig   aspirin EC 81 MG tablet Take 81 mg by mouth daily.   carvedilol (COREG) 6.25 MG tablet take 1 tablet by mouth twice a day with meals (Patient taking differently: Take 6.25 mg by mouth every morning. )   docusate sodium (COLACE) 100 MG capsule Take 100 mg by mouth every 12 (twelve) hours as needed for mild constipation.    furosemide (LASIX) 40 MG tablet Take 40 mg by mouth. TAKE 40 MG EVERY OTHER DAY ALTERNATING WITH 20 MG EVERY OTHER DAY   guaiFENesin (MUCINEX) 600 MG 12 hr tablet Take 600 mg by mouth 2  (two) times daily.   ipratropium-albuterol (DUONEB) 0.5-2.5 (3) MG/3ML SOLN Take 3 mLs by nebulization daily.    iron polysaccharides (NU-IRON) 150 MG capsule Take 150 mg by mouth 2 (two) times daily.   loratadine (CLARITIN) 10 MG tablet Take 10 mg by mouth daily.   mirtazapine (REMERON) 7.5 MG tablet Take 15 mg by mouth at bedtime.    omeprazole (PRILOSEC) 20 MG capsule Take 1 capsule by mouth 2 (two) times daily.   polyethylene glycol powder (GLYCOLAX/MIRALAX) 17 GM/SCOOP powder Take 17 g by mouth daily.   Propylene Glycol (SYSTANE BALANCE) 0.6 % SOLN Apply 2 drops to eye 3 (three) times daily.   senna-docusate (SENOKOT-S) 8.6-50 MG tablet Take 1 tablet by mouth daily.   tiotropium (SPIRIVA) 18 MCG inhalation capsule Place 18 mcg into inhaler and inhale daily.      Allergies:   Patient has no known allergies.   Social History   Tobacco Use   Smoking status: Former Smoker    Packs/day: 1.00    Years: 40.00    Pack years: 40.00    Types: Cigarettes    Start date: 03/19/1961    Quit date: 03/19/2001    Years since quitting: 17.7   Smokeless tobacco: Never Used  Substance Use Topics   Alcohol use: Not Currently    Alcohol/week: 0.0 standard drinks   Drug use: No     Family Hx: The patient's family history includes Cancer in his brother.  ROS:   Please see the history of present illness. All other systems reviewed and are negative.   Prior CV studies:   The following studies were reviewed today:  Echocardiogram 09/13/2011: Study Conclusions  - Left ventricle: The cavity size was normal. Wall thickness was increased in a pattern of mild LVH. Systolic function was normal. The estimated ejection fraction was in the range of 55% to 60%. Wall motion was normal; there were no regional wall motion abnormalities. Doppler parameters are consistent with abnormal left ventricular relaxation (grade 1 diastolic dysfunction). - Aortic valve: Trileaflet; mildly  calcified leaflets. Mild regurgitation. Mean gradient: 65mm Hg (S). - Mitral valve: Mild regurgitation. - Atrial septum: No defect or patent foramen ovale was identified. - Tricuspid valve: Mild regurgitation. Peak gradient: 80mm Hg (D). - Pulmonary arteries: Systolic pressure was mildly to moderately increased. - Pericardium, extracardiac: There was no pericardial effusion.  Labs/Other Tests and Data Reviewed:    EKG:  An ECG dated 09/26/2016 was personally reviewed today and demonstrated:  Sinus bradycardia with leftward axis and nonspecific ST-T changes.  Recent Labs: 10/16/2018: BUN 32; Creatinine, Ser 1.29; Hemoglobin 11.7; Platelets 177; Potassium 3.6; Sodium 140  BUN 65, creatinine 2.37 1.72 (beginning)  Recent Lipid Panel Lab Results  Component Value Date/Time   CHOL  130 05/05/2010 05:22 PM   TRIG 74 05/05/2010 05:22 PM   HDL 38 (L) 05/05/2010 05:22 PM   CHOLHDL 3.4 Ratio 05/05/2010 05:22 PM   LDLCALC 77 05/05/2010 05:22 PM    Wt Readings from Last 3 Encounters:  12/19/18 164 lb 3.2 oz (74.5 kg)  10/15/18 136 lb 3.9 oz (61.8 kg)  05/28/17 144 lb (65.3 kg)     Objective:    Vital Signs:  BP (!) 141/99    Pulse 94    Temp (!) 97.1 F (36.2 C)    Wt 164 lb 3.2 oz (74.5 kg)    SpO2 92%    BMI 24.97 kg/m    I spoke with the patient's nurse Maudie Mercury on the phone.  He was not available for further discussion.  ASSESSMENT & PLAN:    1.  Reported weight gain and anasarca with history of diastolic heart failure, LVEF 55 to 60% as of 2013.  This is also complicated by CKD stage III.  He had been on diuretics in the past, although not recently, at least since July.  He is at least 15 pounds over baseline and was started back on Lasix 40 mg daily by nurse practitioner at his facility about a week ago.  Maudie Mercury tells me that the edema is gradually improving and they plan to reweigh him today, he was last at 164 pounds.  Creatinine has bumped up to 2.37 from 1.72.  I have  recommended that they cut Lasix to 40 mg alternating with 20 mg every other day for now and to continue to follow weight and edema cautiously.  Agree with follow-up planned with nephrology.  This will likely be a scenario where he requires at least a low-dose diuretic to control edema at the expense of renal insufficiency.  If his weight and swelling cannot be controlled adequately and renal function continues to worsen, I would suggest hospitalization for consideration of IV therapies and a follow-up echocardiogram.  2.  CKD stage III, he does have a dialysis shunt that was placed previously but has not required hemodialysis.  Follow-up scheduled with nephrology.  Recent creatinine up to 2.37.  COVID-19 Education: The signs and symptoms of COVID-19 were discussed with the patient and how to seek care for testing (follow up with PCP or arrange E-visit).  The importance of social distancing was discussed today.  Time:   Today, I have spent 12 minutes with the patient with telehealth technology discussing the above problems.     Medication Adjustments/Labs and Tests Ordered: Current medicines are reviewed at length with the patient today.  Concerns regarding medicines are outlined above.   Tests Ordered: No orders of the defined types were placed in this encounter.   Medication Changes: No orders of the defined types were placed in this encounter.   Follow Up:  Virtual Visit 6 weeks with [place].  Signed, Rozann Lesches, MD  12/19/2018 11:18 AM    [place]

## 2018-12-20 ENCOUNTER — Inpatient Hospital Stay (HOSPITAL_COMMUNITY)
Admission: EM | Admit: 2018-12-20 | Discharge: 2019-01-18 | DRG: 871 | Disposition: E | Payer: Medicare Other | Source: Skilled Nursing Facility | Attending: Family Medicine | Admitting: Family Medicine

## 2018-12-20 ENCOUNTER — Encounter (HOSPITAL_COMMUNITY): Payer: Self-pay | Admitting: Emergency Medicine

## 2018-12-20 ENCOUNTER — Emergency Department (HOSPITAL_COMMUNITY): Payer: Medicare Other

## 2018-12-20 ENCOUNTER — Other Ambulatory Visit: Payer: Self-pay

## 2018-12-20 DIAGNOSIS — D696 Thrombocytopenia, unspecified: Secondary | ICD-10-CM | POA: Diagnosis present

## 2018-12-20 DIAGNOSIS — I2510 Atherosclerotic heart disease of native coronary artery without angina pectoris: Secondary | ICD-10-CM | POA: Diagnosis present

## 2018-12-20 DIAGNOSIS — I5032 Chronic diastolic (congestive) heart failure: Secondary | ICD-10-CM | POA: Diagnosis present

## 2018-12-20 DIAGNOSIS — J449 Chronic obstructive pulmonary disease, unspecified: Secondary | ICD-10-CM | POA: Diagnosis present

## 2018-12-20 DIAGNOSIS — I480 Paroxysmal atrial fibrillation: Secondary | ICD-10-CM | POA: Diagnosis present

## 2018-12-20 DIAGNOSIS — N390 Urinary tract infection, site not specified: Secondary | ICD-10-CM | POA: Diagnosis present

## 2018-12-20 DIAGNOSIS — M1990 Unspecified osteoarthritis, unspecified site: Secondary | ICD-10-CM | POA: Diagnosis present

## 2018-12-20 DIAGNOSIS — Z7401 Bed confinement status: Secondary | ICD-10-CM

## 2018-12-20 DIAGNOSIS — D65 Disseminated intravascular coagulation [defibrination syndrome]: Secondary | ICD-10-CM | POA: Diagnosis present

## 2018-12-20 DIAGNOSIS — M6249 Contracture of muscle, multiple sites: Secondary | ICD-10-CM | POA: Diagnosis present

## 2018-12-20 DIAGNOSIS — R601 Generalized edema: Secondary | ICD-10-CM | POA: Diagnosis not present

## 2018-12-20 DIAGNOSIS — J9601 Acute respiratory failure with hypoxia: Secondary | ICD-10-CM | POA: Diagnosis present

## 2018-12-20 DIAGNOSIS — Z1612 Extended spectrum beta lactamase (ESBL) resistance: Secondary | ICD-10-CM | POA: Diagnosis present

## 2018-12-20 DIAGNOSIS — I998 Other disorder of circulatory system: Secondary | ICD-10-CM | POA: Diagnosis present

## 2018-12-20 DIAGNOSIS — I130 Hypertensive heart and chronic kidney disease with heart failure and stage 1 through stage 4 chronic kidney disease, or unspecified chronic kidney disease: Secondary | ICD-10-CM | POA: Diagnosis present

## 2018-12-20 DIAGNOSIS — R6520 Severe sepsis without septic shock: Secondary | ICD-10-CM | POA: Diagnosis not present

## 2018-12-20 DIAGNOSIS — Z20828 Contact with and (suspected) exposure to other viral communicable diseases: Secondary | ICD-10-CM | POA: Diagnosis present

## 2018-12-20 DIAGNOSIS — B9629 Other Escherichia coli [E. coli] as the cause of diseases classified elsewhere: Secondary | ICD-10-CM | POA: Diagnosis present

## 2018-12-20 DIAGNOSIS — K219 Gastro-esophageal reflux disease without esophagitis: Secondary | ICD-10-CM | POA: Diagnosis present

## 2018-12-20 DIAGNOSIS — E872 Acidosis, unspecified: Secondary | ICD-10-CM | POA: Diagnosis present

## 2018-12-20 DIAGNOSIS — Z8701 Personal history of pneumonia (recurrent): Secondary | ICD-10-CM

## 2018-12-20 DIAGNOSIS — N179 Acute kidney failure, unspecified: Secondary | ICD-10-CM | POA: Diagnosis present

## 2018-12-20 DIAGNOSIS — R532 Functional quadriplegia: Secondary | ICD-10-CM | POA: Diagnosis present

## 2018-12-20 DIAGNOSIS — I081 Rheumatic disorders of both mitral and tricuspid valves: Secondary | ICD-10-CM | POA: Diagnosis present

## 2018-12-20 DIAGNOSIS — A419 Sepsis, unspecified organism: Secondary | ICD-10-CM | POA: Diagnosis not present

## 2018-12-20 DIAGNOSIS — N183 Chronic kidney disease, stage 3 unspecified: Secondary | ICD-10-CM | POA: Diagnosis present

## 2018-12-20 DIAGNOSIS — Z515 Encounter for palliative care: Secondary | ICD-10-CM

## 2018-12-20 DIAGNOSIS — Z79899 Other long term (current) drug therapy: Secondary | ICD-10-CM

## 2018-12-20 DIAGNOSIS — Z87891 Personal history of nicotine dependence: Secondary | ICD-10-CM

## 2018-12-20 DIAGNOSIS — I428 Other cardiomyopathies: Secondary | ICD-10-CM | POA: Diagnosis present

## 2018-12-20 DIAGNOSIS — E785 Hyperlipidemia, unspecified: Secondary | ICD-10-CM | POA: Diagnosis present

## 2018-12-20 DIAGNOSIS — H9190 Unspecified hearing loss, unspecified ear: Secondary | ICD-10-CM

## 2018-12-20 DIAGNOSIS — Z7951 Long term (current) use of inhaled steroids: Secondary | ICD-10-CM

## 2018-12-20 DIAGNOSIS — I272 Pulmonary hypertension, unspecified: Secondary | ICD-10-CM | POA: Diagnosis present

## 2018-12-20 DIAGNOSIS — R0600 Dyspnea, unspecified: Secondary | ICD-10-CM | POA: Diagnosis not present

## 2018-12-20 DIAGNOSIS — Z66 Do not resuscitate: Secondary | ICD-10-CM | POA: Diagnosis not present

## 2018-12-20 DIAGNOSIS — I96 Gangrene, not elsewhere classified: Secondary | ICD-10-CM | POA: Diagnosis present

## 2018-12-20 DIAGNOSIS — R627 Adult failure to thrive: Secondary | ICD-10-CM | POA: Diagnosis present

## 2018-12-20 DIAGNOSIS — I10 Essential (primary) hypertension: Secondary | ICD-10-CM | POA: Diagnosis not present

## 2018-12-20 DIAGNOSIS — A4151 Sepsis due to Escherichia coli [E. coli]: Principal | ICD-10-CM | POA: Diagnosis present

## 2018-12-20 DIAGNOSIS — Z7982 Long term (current) use of aspirin: Secondary | ICD-10-CM

## 2018-12-20 LAB — URINALYSIS, ROUTINE W REFLEX MICROSCOPIC
Bilirubin Urine: NEGATIVE
Glucose, UA: NEGATIVE mg/dL
Ketones, ur: NEGATIVE mg/dL
Nitrite: NEGATIVE
Protein, ur: 100 mg/dL — AB
Specific Gravity, Urine: 1.013 (ref 1.005–1.030)
pH: 5 (ref 5.0–8.0)

## 2018-12-20 LAB — CBC WITH DIFFERENTIAL/PLATELET
Abs Immature Granulocytes: 0.11 10*3/uL — ABNORMAL HIGH (ref 0.00–0.07)
Basophils Absolute: 0 10*3/uL (ref 0.0–0.1)
Basophils Relative: 1 %
Eosinophils Absolute: 0 10*3/uL (ref 0.0–0.5)
Eosinophils Relative: 0 %
HCT: 50 % (ref 39.0–52.0)
Hemoglobin: 15 g/dL (ref 13.0–17.0)
Immature Granulocytes: 1 %
Lymphocytes Relative: 15 %
Lymphs Abs: 1.2 10*3/uL (ref 0.7–4.0)
MCH: 32.1 pg (ref 26.0–34.0)
MCHC: 30 g/dL (ref 30.0–36.0)
MCV: 106.8 fL — ABNORMAL HIGH (ref 80.0–100.0)
Monocytes Absolute: 0.6 10*3/uL (ref 0.1–1.0)
Monocytes Relative: 8 %
Neutro Abs: 5.8 10*3/uL (ref 1.7–7.7)
Neutrophils Relative %: 75 %
Platelets: 86 10*3/uL — ABNORMAL LOW (ref 150–400)
RBC: 4.68 MIL/uL (ref 4.22–5.81)
RDW: 19.1 % — ABNORMAL HIGH (ref 11.5–15.5)
WBC: 7.7 10*3/uL (ref 4.0–10.5)
nRBC: 4 % — ABNORMAL HIGH (ref 0.0–0.2)

## 2018-12-20 LAB — LACTIC ACID, PLASMA
Lactic Acid, Venous: 3.3 mmol/L (ref 0.5–1.9)
Lactic Acid, Venous: 5 mmol/L (ref 0.5–1.9)

## 2018-12-20 LAB — SARS CORONAVIRUS 2 BY RT PCR (HOSPITAL ORDER, PERFORMED IN ~~LOC~~ HOSPITAL LAB): SARS Coronavirus 2: NEGATIVE

## 2018-12-20 LAB — COMPREHENSIVE METABOLIC PANEL
ALT: 50 U/L — ABNORMAL HIGH (ref 0–44)
AST: 64 U/L — ABNORMAL HIGH (ref 15–41)
Albumin: 2.9 g/dL — ABNORMAL LOW (ref 3.5–5.0)
Alkaline Phosphatase: 117 U/L (ref 38–126)
Anion gap: 12 (ref 5–15)
BUN: 72 mg/dL — ABNORMAL HIGH (ref 8–23)
CO2: 21 mmol/L — ABNORMAL LOW (ref 22–32)
Calcium: 8.3 mg/dL — ABNORMAL LOW (ref 8.9–10.3)
Chloride: 104 mmol/L (ref 98–111)
Creatinine, Ser: 2.9 mg/dL — ABNORMAL HIGH (ref 0.61–1.24)
GFR calc Af Amer: 22 mL/min — ABNORMAL LOW (ref >60)
GFR calc non Af Amer: 19 mL/min — ABNORMAL LOW (ref >60)
Glucose, Bld: 120 mg/dL — ABNORMAL HIGH (ref 70–99)
Potassium: 5.2 mmol/L — ABNORMAL HIGH (ref 3.5–5.1)
Sodium: 137 mmol/L (ref 135–145)
Total Bilirubin: 2.5 mg/dL — ABNORMAL HIGH (ref 0.3–1.2)
Total Protein: 6.3 g/dL — ABNORMAL LOW (ref 6.5–8.1)

## 2018-12-20 LAB — PROTIME-INR
INR: 1.5 — ABNORMAL HIGH (ref 0.8–1.2)
Prothrombin Time: 17.6 seconds — ABNORMAL HIGH (ref 11.4–15.2)

## 2018-12-20 LAB — URINALYSIS, MICROSCOPIC (REFLEX)
Squamous Epithelial / HPF: NONE SEEN (ref 0–5)
WBC, UA: >50 WBC/hpf (ref 0–5)

## 2018-12-20 LAB — APTT: aPTT: 43 seconds — ABNORMAL HIGH (ref 24–36)

## 2018-12-20 LAB — CBG MONITORING, ED: Glucose-Capillary: 106 mg/dL — ABNORMAL HIGH (ref 70–99)

## 2018-12-20 MED ORDER — HALOPERIDOL 0.5 MG PO TABS: 0.5000 mg | ORAL_TABLET | ORAL | Status: DC | PRN

## 2018-12-20 MED ORDER — ACETAMINOPHEN 650 MG RE SUPP: 650.0000 mg | Freq: Four times a day (QID) | RECTAL | Status: DC | PRN

## 2018-12-20 MED ORDER — ACETAMINOPHEN 325 MG PO TABS: 650.0000 mg | ORAL_TABLET | Freq: Four times a day (QID) | ORAL | Status: DC | PRN

## 2018-12-20 MED ORDER — MORPHINE SULFATE (PF) 2 MG/ML IV SOLN: 1.0000 mg | INTRAVENOUS | Status: DC | PRN

## 2018-12-20 MED ORDER — SODIUM CHLORIDE 0.9% FLUSH: 3.0000 mL | INTRAVENOUS | Status: DC | PRN

## 2018-12-20 MED ORDER — IPRATROPIUM-ALBUTEROL 0.5-2.5 (3) MG/3ML IN SOLN: 3.0000 mL | Freq: Four times a day (QID) | RESPIRATORY_TRACT | Status: DC | PRN

## 2018-12-20 MED ORDER — METHYLPREDNISOLONE SODIUM SUCC 125 MG IJ SOLR
125.0000 mg | Freq: Once | INTRAMUSCULAR | Status: AC
  Administered 2018-12-20: 125 mg via INTRAVENOUS
  Filled 2018-12-20: qty 2

## 2018-12-20 MED ORDER — GLYCOPYRROLATE 0.2 MG/ML IJ SOLN: 0.2000 mg | INTRAMUSCULAR | Status: DC | PRN

## 2018-12-20 MED ORDER — GLYCOPYRROLATE 1 MG PO TABS: 1.0000 mg | ORAL_TABLET | ORAL | Status: DC | PRN

## 2018-12-20 MED ORDER — SODIUM CHLORIDE 0.9 % IV BOLUS
1000.0000 mL | Freq: Once | INTRAVENOUS | Status: AC
  Administered 2018-12-20: 1000 mL via INTRAVENOUS

## 2018-12-20 MED ORDER — PIPERACILLIN-TAZOBACTAM 3.375 G IVPB 30 MIN
3.3750 g | Freq: Once | INTRAVENOUS | Status: AC
  Administered 2018-12-20: 3.375 g via INTRAVENOUS
  Filled 2018-12-20: qty 50

## 2018-12-20 MED ORDER — ALBUTEROL SULFATE (2.5 MG/3ML) 0.083% IN NEBU
2.5000 mg | INHALATION_SOLUTION | Freq: Once | RESPIRATORY_TRACT | Status: AC
  Administered 2018-12-20: 2.5 mg via RESPIRATORY_TRACT
  Filled 2018-12-20: qty 3

## 2018-12-20 MED ORDER — IPRATROPIUM-ALBUTEROL 0.5-2.5 (3) MG/3ML IN SOLN
3.0000 mL | Freq: Once | RESPIRATORY_TRACT | Status: AC
  Administered 2018-12-20: 3 mL via RESPIRATORY_TRACT
  Filled 2018-12-20: qty 3

## 2018-12-20 MED ORDER — BIOTENE DRY MOUTH MT LIQD: 15.0000 mL | OROMUCOSAL | Status: DC | PRN

## 2018-12-20 MED ORDER — ONDANSETRON 4 MG PO TBDP: 4.0000 mg | ORAL_TABLET | Freq: Four times a day (QID) | ORAL | Status: DC | PRN

## 2018-12-20 MED ORDER — VANCOMYCIN HCL IN DEXTROSE 1-5 GM/200ML-% IV SOLN
1000.0000 mg | Freq: Once | INTRAVENOUS | Status: AC
  Administered 2018-12-20: 14:00:00 1000 mg via INTRAVENOUS
  Filled 2018-12-20: qty 200

## 2018-12-20 MED ORDER — HALOPERIDOL LACTATE 5 MG/ML IJ SOLN: 0.5000 mg | INTRAMUSCULAR | Status: DC | PRN

## 2018-12-20 MED ORDER — POLYVINYL ALCOHOL 1.4 % OP SOLN: 1.0000 [drp] | Freq: Four times a day (QID) | OPHTHALMIC | Status: DC | PRN

## 2018-12-20 MED ORDER — ALBUTEROL SULFATE HFA 108 (90 BASE) MCG/ACT IN AERS
6.0000 | INHALATION_SPRAY | RESPIRATORY_TRACT | Status: DC | PRN
  Filled 2018-12-20: qty 6.7

## 2018-12-20 MED ORDER — SODIUM CHLORIDE 0.9 % IV SOLN: 250.0000 mL | INTRAVENOUS | Status: DC | PRN

## 2018-12-20 MED ORDER — SODIUM CHLORIDE 0.9% FLUSH
3.0000 mL | Freq: Two times a day (BID) | INTRAVENOUS | Status: DC
  Administered 2018-12-20 – 2018-12-22 (×3): 3 mL via INTRAVENOUS

## 2018-12-20 MED ORDER — SODIUM CHLORIDE 0.9 % IV BOLUS: 1000.0000 mL | Freq: Once | INTRAVENOUS | Status: DC

## 2018-12-20 MED ORDER — HALOPERIDOL LACTATE 2 MG/ML PO CONC
0.5000 mg | ORAL | Status: DC | PRN
  Filled 2018-12-20: qty 0.3

## 2018-12-20 MED ORDER — ONDANSETRON HCL 4 MG/2ML IJ SOLN: 4.0000 mg | Freq: Four times a day (QID) | INTRAMUSCULAR | Status: DC | PRN

## 2018-12-20 NOTE — ED Notes (Signed)
Patient to be comfort measures only per Dr Roderic Palau.

## 2018-12-20 NOTE — ED Notes (Signed)
CRITICAL VALUE ALERT  Critical Value:  Lactic acid 3.3  Date & Time Notied:  01/01/2019 1320  Provider Notified: Roderic Palau  Orders Received/Actions taken: na

## 2018-12-20 NOTE — ED Notes (Signed)
Date and time results received: 01/03/2019 1533 (use smartphrase ".now" to insert current time)  Test: lactic acid Critical Value: 5.0  Name of Provider Notified: Eulis Foster  Orders Received? Or Actions Taken?: Orders Received - See Orders for details

## 2018-12-20 NOTE — ED Notes (Signed)
Dr Roderic Palau in room with patient.

## 2018-12-20 NOTE — H&P (Signed)
History and Physical    [name] Rice RCB:[id number] DOB: 1935-04-22 DOA: 01/15/2019  PCP: Benita Stabile, MD  Patient coming from: Skilled nursing facility  I have personally briefly reviewed patient's old medical records in [facility]  Chief Complaint: Shortness of breath  HPI: [name] Rice is a 83 y.o. male with medical history significant of COPD, diastolic heart failure, hypertension, chronic kidney disease stage III, who is chronically bedbound and resident of a nursing home for the last several years.  Patient is extremely hard of hearing and is unable to provide any meaningful history.  History is obtained from medical record as well as speaking to his emergency contact, his cousin [name] Rice.  Report reports that she has been his main caregiver and decision maker.  He is brought to the hospital with worsening shortness of breath.  Nursing home he was noted to have oxygen saturations in the 70s.  He was placed on a nonrebreather.  Review reports that she was told by nursing home staff that his right foot had appeared to be cyanotic.  It is unclear when cyanosis began, but per nursing home staff it was present yesterday.  ED Course: On arrival to the emergency room, patient was noted to be hypoxic and was placed on nonrebreather.  Oxygen saturations improved to the low 90s.  He is tachycardic with a heart rate in the 120s in atrial fibrillation.  Blood pressures in the low 100s.  Lactic acid elevated at 3.3.  He received a liter of IV fluids and subsequent lactic acid increased to 5.0.  Chest x-ray did not show any clear evidence of infiltrate.  Urinalysis does indicate possible infection.  Baseline creatinine appears to be around 1.3, and ER today noted to be elevated at 2.9 with a BUN of 72.  Potassium mildly elevated at 5.2.  Hemoglobin is elevated at 15 on baseline hemoglobin is approximately 11-12.  Platelets are low at 86.  INR is elevated at 1.5.  Patient was started on intravenous  antibiotics for possible sepsis he has been referred for admission.  Review of Systems: Unable to assess due to patient's mental status and hard of hearing   Past Medical History:  Diagnosis Date   Atrial fibrillation (HCC)    Bradycardia    CKD (chronic kidney disease), stage III    Has had temporary dialysis in the past   COPD (chronic obstructive pulmonary disease) (HCC)    Coronary atherosclerosis of native coronary artery    Nonobstructive   DJD (degenerative joint disease)    Essential hypertension    GERD (gastroesophageal reflux disease)    HOH (hard of hearing)    Lower extremity edema    Mitral regurgitation    Moderate   NICM (nonischemic cardiomyopathy) (HCC)    LVEF 55% 3/11   Paroxysmal atrial fibrillation (HCC)    Pulmonary hypertension (HCC)    Shingles    Tricuspid regurgitation    Ventricular dysfunction, right     Past Surgical History:  Procedure Laterality Date   CATARACT EXTRACTION W/ INTRAOCULAR LENS IMPLANT     right   DIALYSIS FISTULA CREATION     left forearm   TOTAL HIP ARTHROPLASTY  ~ 2005   left    Social History:  reports that he quit smoking about 17 years ago. His smoking use included cigarettes. He started smoking about 57 years ago. He has a 40.00 pack-year smoking history. He has never used smokeless tobacco. He reports previous alcohol use.  He reports that he does not use drugs.  No Known Allergies  Family History  Problem Relation Age of Onset   Cancer Brother     Prior to Admission medications   Medication Sig Start Date End Date Taking? Authorizing Provider  aspirin EC 81 MG tablet Take 81 mg by mouth daily.    [provider]  carvedilol (COREG) 6.25 MG tablet take 1 tablet by mouth twice a day with meals Patient taking differently: Take 6.25 mg by mouth every morning.  03/25/15   Jonelle SidleMcDowell, Samuel G, MD  docusate sodium (COLACE) 100 MG capsule Take 100 mg by mouth every 12 (twelve) hours as  needed for mild constipation.     [provider]  furosemide (LASIX) 40 MG tablet Take 40 mg by mouth. TAKE 40 MG EVERY OTHER DAY ALTERNATING WITH 20 MG EVERY OTHER DAY    [provider]  guaiFENesin (MUCINEX) 600 MG 12 hr tablet Take 600 mg by mouth 2 (two) times daily.    [provider]  ipratropium-albuterol (DUONEB) 0.5-2.5 (3) MG/3ML SOLN Take 3 mLs by nebulization daily.     [provider]  iron polysaccharides (NU-IRON) 150 MG capsule Take 150 mg by mouth 2 (two) times daily.    [provider]  loratadine (CLARITIN) 10 MG tablet Take 10 mg by mouth daily.    [provider]  mirtazapine (REMERON) 7.5 MG tablet Take 15 mg by mouth at bedtime.     [provider]  omeprazole (PRILOSEC) 20 MG capsule Take 1 capsule by mouth 2 (two) times daily. 09/07/16   [provider]  polyethylene glycol powder (GLYCOLAX/MIRALAX) 17 GM/SCOOP powder Take 17 g by mouth daily.    [provider]  Propylene Glycol (SYSTANE BALANCE) 0.6 % SOLN Apply 2 drops to eye 3 (three) times daily.    [provider]  senna-docusate (SENOKOT-S) 8.6-50 MG tablet Take 1 tablet by mouth daily.    [provider]  tiotropium (SPIRIVA) 18 MCG inhalation capsule Place 18 mcg into inhaler and inhale daily.     [provider]    Physical Exam: Vitals:   12/24/2018 1530 12/29/2018 1600 01/02/2019 1615 01/07/2019 1645  BP: (!) 126/96 (!) 115/99 108/77 117/81  Pulse: 68 (!) 42 84 (!) 104  Resp: (!) 22 (!) 22 19 (!) 25  Temp: (!) 96.4 F (35.8 C) (!) 96.4 F (35.8 C) (!) 96.4 F (35.8 C) (!) 96.6 F (35.9 C)  TempSrc:      SpO2: 98% (!) 83% 97% 91%  Weight:      Height:        Constitutional: NAD, calm, comfortable Eyes: PERRL, lids and conjunctivae normal ENMT: Mucous membranes are dry. Posterior pharynx clear of any exudate or lesions.Normal dentition.  Neck: normal, supple, no masses, no thyromegaly Respiratory:  Mild wheeze bilaterally.  Crackles at bases.. Normal respiratory effort. No accessory muscle use.  Cardiovascular: Irregular rate and rhythm.  Diffuse anasarca.  Pedal pulses could not be detected with palpation or Doppler ultrasound in right foot.  AV fistula palpable in left forearm. Abdomen: no tenderness, no masses palpated. No hepatosplenomegaly. Bowel sounds positive.  Musculoskeletal: Contractures present in lower extremities bilaterally.  Right foot is cool to touch. Skin: Cyanosis noted to right toes.  Venous stasis changes of the lower extremities bilaterally. Neurologic: No facial asymmetry.  Very hard of hearing.  Limited exam.  Moving upper and lower extremities spontaneously Psychiatric: Difficult to assess due to hearing status.  Labs on Admission: I have personally reviewed following labs and imaging studies  CBC: Recent Labs  Lab 12/28/2018 1230  WBC 7.7  NEUTROABS 5.8  HGB 15.0  HCT 50.0  MCV 106.8*  PLT 86*   Basic Metabolic Panel: Recent Labs  Lab 12/28/2018 1230  NA 137  K 5.2*  CL 104  CO2 21*  GLUCOSE 120*  BUN 72*  CREATININE 2.90*  CALCIUM 8.3*   GFR: Estimated Creatinine Clearance: 18 mL/min (A) (by C-G formula based on SCr of 2.9 mg/dL (H)). Liver Function Tests: Recent Labs  Lab 01/04/2019 1230  AST 64*  ALT 50*  ALKPHOS 117  BILITOT 2.5*  PROT 6.3*  ALBUMIN 2.9*   No results for input(s): LIPASE, AMYLASE in the last 168 hours. No results for input(s): AMMONIA in the last 168 hours. Coagulation Profile: Recent Labs  Lab 12/22/2018 1230  INR 1.5*   Cardiac Enzymes: No results for input(s): CKTOTAL, CKMB, CKMBINDEX, TROPONINI in the last 168 hours. BNP (last 3 results) No results for input(s): PROBNP in the last 8760 hours. HbA1C: No results for input(s): HGBA1C in the last 72 hours. CBG: Recent Labs  Lab 12/21/2018 1227  GLUCAP 106*   Lipid Profile: No results for input(s): CHOL, HDL, LDLCALC, TRIG, CHOLHDL,  LDLDIRECT in the last 72 hours. Thyroid Function Tests: No results for input(s): TSH, T4TOTAL, FREET4, T3FREE, THYROIDAB in the last 72 hours. Anemia Panel: No results for input(s): VITAMINB12, FOLATE, FERRITIN, TIBC, IRON, RETICCTPCT in the last 72 hours. Urine analysis:    Component Value Date/Time   COLORURINE YELLOW 01/13/2019 1220   APPEARANCEUR TURBID (A) 12/26/2018 1220   LABSPEC 1.013 01/07/2019 1220   PHURINE 5.0 12/22/2018 1220   GLUCOSEU NEGATIVE 01/09/2019 1220   HGBUR LARGE (A) 12/19/2018 1220   BILIRUBINUR NEGATIVE 01/01/2019 1220   KETONESUR NEGATIVE 12/22/2018 1220   PROTEINUR 100 (A) 01/13/2019 1220   UROBILINOGEN 0.2 01/11/2008 0146   NITRITE NEGATIVE 01/11/2019 1220   LEUKOCYTESUR MODERATE (A) 01/14/2019 1220    Radiological Exams on Admission: Dg Chest Port 1 View  Result Date: 12/26/2018 CLINICAL DATA:  Shortness of breath. Left arm and right leg swelling. Bilateral leg cyanosis. Left arm dialysis access fistula. EXAM: PORTABLE CHEST 1 VIEW COMPARISON:  10/15/2018 FINDINGS: Again demonstrated is significant patient rotation to the right. Stable enlarged cardiac silhouette and tortuous aorta. Mildly increased mild bibasilar atelectasis. Interval small right lateral pleural effusion. No acute bony abnormality. IMPRESSION: 1. Mildly increased mild bibasilar atelectasis. 2. Interval small right lateral pleural effusion. 3. Stable cardiomegaly. Electronically Signed   By: Claudie Revering M.D.   On: 01/07/2019 13:46    EKG: Independently reviewed.  Rapid atrial fibrillation  Assessment/Plan Active Problems:   Essential hypertension, benign   CKD (chronic kidney disease) stage 3, GFR 30-59 ml/min (HCC)   HOH (hard of hearing)   Paroxysmal atrial fibrillation (HCC)   COPD (chronic obstructive pulmonary disease) (HCC)   Acute respiratory failure with hypoxia (HCC)   Anasarca   Lactic acidosis   Ischemia of foot   Sepsis (HCC)   Functional quadriplegia (HCC)    Chronic diastolic CHF (congestive heart failure) (HCC)   AKI (acute kidney injury) (HCC)   Thrombocytopenia (HCC)   DIC (disseminated intravascular coagulation) ([place])     1. Ischemia of right foot.  Patient was noted to have significant cyanosis of his right toes.  Pulses were not detected by palpation or by Doppler ultrasound.  Lactic acid is significantly elevated.  He does have  a history of atrial fibrillation and does not appear to be on chronic anticoagulation.  His care was reviewed with the patient as well as his cousin [name] Rice.  They both made it clear that patient would not want any invasive intervention such as surgical management or any revascularization procedures. 2. Sepsis.  Possibly related to urinary tract infection.  He received a dose of antibiotics in the emergency room and received a bolus of IV fluids.  Blood pressures currently stable.  Blood cultures were sent.  Lactic acid currently trending up. 3. Thrombocytopenia, suspect this related to DIC from #1 and #2. 4. Acute kidney injury superimposed on chronic kidney disease stage III.  Per patient's family, he had been receiving dialysis approximately 8 to 9 years ago prior to moving to [place].  He has not received dialysis in quite some times.  After discussing with patient and family, he would not want to go back on dialysis at this time. 5. Anasarca.  Patient has diffuse anasarca.  Likely related to poor p.o. intake.  Albumin is currently 2.9, but I suspect he is hemoconcentrated and once adequately hydrated his albumin will be much lower. 6. Acute respiratory failure with hypoxia.  Currently on nonrebreather.  No clear evidence of pneumonia on chest x-ray.  May have an element of COPD exacerbation.  May also be related to overall sepsis.  Continue supportive treatments. 7. COPD.  Has mild wheezing and evidence of possible COPD exacerbation.  Continue on bronchodilators. 8. Paroxysmal atrial fibrillation.  He is not on any  chronic anticoagulation.  I suspect he may have had a cardioembolic event leading to ischemia of his foot. 9. Chronic diastolic congestive heart failure.  He has been taking oral Lasix.  Continues to have diffuse anasarca. 10. Functional quadriplegia.  Nursing home staff reports that he is nonambulatory for quite some time.  He does have some contractures in his lower extremities. 11. Goals of care.  After having several conversations with the patient as well as his family, I explained the patient did not want aggressive/invasive measures performed.  Family is in agreement and reports that this is in line with what he has told them in the past.  With significant ischemia of his right foot and a rising lactic acid, his overall prognosis is poor.  Family is in agreement to transition to comfort measures.  Will admit for supportive measures.  DVT prophylaxis: None Code Status: DNR, comfort measures Family Communication: Discussed with cousin, [name] Rice who is his emergency contact him decision-maker Disposition Plan: Possible in-hospital death Consults called:   Admission status: Inpatient, MedSurg  Erick Blinks MD [facility]   If 7PM-7AM, please contact night-coverage [email or web address]   01/14/2019, 5:08 PM

## 2018-12-20 NOTE — Progress Notes (Signed)
Notified bedside nurse of need to draw repeat LA.

## 2018-12-20 NOTE — ED Provider Notes (Signed)
[name] Provider Note   CSN: [id number] Arrival date & time: 12/26/18  1212     History   Chief Complaint Chief Complaint  Patient presents with  . Shortness of Breath    HPI [name] is a 83 y.o. male.     Patient brought to the emergency department for shortness of breath.  Patient has a history of COPD and congestive heart failure and is bedridden with swelling to his legs.  The history is provided by the patient. No language interpreter was used.  Shortness of Breath Severity:  Moderate Onset quality:  Sudden Timing:  Constant Progression:  Worsening Chronicity:  New Context: not activity   Relieved by:  Nothing Worsened by:  Nothing Associated symptoms: no abdominal pain, no chest pain, no cough, no headaches and no rash     Past Medical History:  Diagnosis Date  . Atrial fibrillation (HCC)   . Bradycardia   . CKD (chronic kidney disease), stage III    Has had temporary dialysis in the past  . COPD (chronic obstructive pulmonary disease) (HCC)   . Coronary atherosclerosis of native coronary artery    Nonobstructive  . DJD (degenerative joint disease)   . Essential hypertension   . GERD (gastroesophageal reflux disease)   . HOH (hard of hearing)   . Lower extremity edema   . Mitral regurgitation    Moderate  . NICM (nonischemic cardiomyopathy) (HCC)    LVEF 55% 3/11  . Paroxysmal atrial fibrillation (HCC)   . Pulmonary hypertension (HCC)   . Shingles   . Tricuspid regurgitation   . Ventricular dysfunction, right     Patient Active Problem List   Diagnosis Date Noted  . HCAP (healthcare-associated pneumonia) 10/15/2018  . HOH (hard of hearing)   . Paroxysmal atrial fibrillation (HCC)   . COPD (chronic obstructive pulmonary disease) (HCC)   . Edema 04/02/2012  . CKD (chronic kidney disease) stage 3, GFR 30-59 ml/min (HCC) 02/17/2009  . HYPERLIPIDEMIA 08/20/2008  . Essential hypertension, benign 08/20/2008  .  CORONARY ATHEROSCLEROSIS NATIVE CORONARY ARTERY 08/20/2008  . NICM (nonischemic cardiomyopathy) (HCC) 08/20/2008    Past Surgical History:  Procedure Laterality Date  . CATARACT EXTRACTION W/ INTRAOCULAR LENS IMPLANT     right  . DIALYSIS FISTULA CREATION     left forearm  . TOTAL HIP ARTHROPLASTY  ~ 2005   left        Home Medications    Prior to Admission medications   Medication Sig Start Date End Date Taking? Authorizing Provider  aspirin EC 81 MG tablet Take 81 mg by mouth daily.    [provider]  carvedilol (COREG) 6.25 MG tablet take 1 tablet by mouth twice a day with meals Patient taking differently: Take 6.25 mg by mouth every morning.  03/25/15   Jonelle Sidle, MD  docusate sodium (COLACE) 100 MG capsule Take 100 mg by mouth every 12 (twelve) hours as needed for mild constipation.     [provider]  furosemide (LASIX) 40 MG tablet Take 40 mg by mouth. TAKE 40 MG EVERY OTHER DAY ALTERNATING WITH 20 MG EVERY OTHER DAY    [provider]  guaiFENesin (MUCINEX) 600 MG 12 hr tablet Take 600 mg by mouth 2 (two) times daily.    [provider]  ipratropium-albuterol (DUONEB) 0.5-2.5 (3) MG/3ML SOLN Take 3 mLs by nebulization daily.     [provider]  iron polysaccharides (NU-IRON) 150 MG capsule Take 150  mg by mouth 2 (two) times daily.    [provider]  loratadine (CLARITIN) 10 MG tablet Take 10 mg by mouth daily.    [provider]  mirtazapine (REMERON) 7.5 MG tablet Take 15 mg by mouth at bedtime.     [provider]  omeprazole (PRILOSEC) 20 MG capsule Take 1 capsule by mouth 2 (two) times daily. 09/07/16   [provider]  polyethylene glycol powder (GLYCOLAX/MIRALAX) 17 GM/SCOOP powder Take 17 g by mouth daily.    [provider]  Propylene Glycol (SYSTANE BALANCE) 0.6 % SOLN Apply 2 drops to eye 3 (three) times daily.    [provider]  senna-docusate (SENOKOT-S)  8.6-50 MG tablet Take 1 tablet by mouth daily.    [provider]  tiotropium (SPIRIVA) 18 MCG inhalation capsule Place 18 mcg into inhaler and inhale daily.     [provider]    Family History Family History  Problem Relation Age of Onset  . Cancer Brother     Social History Social History   Tobacco Use  . Smoking status: Former Smoker    Packs/day: 1.00    Years: 40.00    Pack years: 40.00    Types: Cigarettes    Start date: 03/19/1961    Quit date: 03/19/2001    Years since quitting: 17.7  . Smokeless tobacco: Never Used  Substance Use Topics  . Alcohol use: Not Currently    Alcohol/week: 0.0 standard drinks  . Drug use: No     Allergies   Patient has no known allergies.   Review of Systems Review of Systems  Constitutional: Negative for appetite change and fatigue.  HENT: Negative for congestion, ear discharge and sinus pressure.   Eyes: Negative for discharge.  Respiratory: Positive for shortness of breath. Negative for cough.   Cardiovascular: Negative for chest pain.  Gastrointestinal: Negative for abdominal pain and diarrhea.  Genitourinary: Negative for frequency and hematuria.  Musculoskeletal: Negative for back pain.  Skin: Negative for rash.  Neurological: Negative for seizures and headaches.  Psychiatric/Behavioral: Negative for hallucinations.     Physical Exam Updated Vital Signs BP (!) 108/96 (BP Location: Right Wrist)   Pulse 97   Temp (!) 96.6 F (35.9 C) (Core (Comment))   Resp (!) 21   Ht 5\' 6"  (1.676 m)   Wt 74.5 kg   SpO2 93%   BMI 26.51 kg/m   Physical Exam Vitals signs and nursing note reviewed.  Constitutional:      Appearance: He is well-developed.  HENT:     Head: Normocephalic.     Nose: Nose normal.  Eyes:     General: No scleral icterus.    Conjunctiva/sclera: Conjunctivae normal.  Neck:     Musculoskeletal: Neck supple.     Thyroid: No thyromegaly.  Cardiovascular:     Rate and Rhythm:  Tachycardia present. Rhythm irregular.     Heart sounds: No murmur. No friction rub. No gallop.   Pulmonary:     Breath sounds: No stridor. No wheezing or rales.  Chest:     Chest wall: No tenderness.  Abdominal:     General: There is no distension.     Tenderness: There is no abdominal tenderness. There is no rebound.  Musculoskeletal:     Comments: Edema in both legs and worse on the right  Lymphadenopathy:     Cervical: No cervical adenopathy.  Skin:    Findings: No erythema or rash.  Neurological:  Mental Status: He is alert.     Motor: No abnormal muscle tone.     Coordination: Coordination normal.     Comments: Mildly lethargic  Psychiatric:        Behavior: Behavior normal.      ED Treatments / Results  Labs (all labs ordered are listed, but only abnormal results are displayed) Labs Reviewed  LACTIC ACID, PLASMA - Abnormal; Notable for the following components:      Result Value   Lactic Acid, Venous 3.3 (*)    All other components within normal limits  COMPREHENSIVE METABOLIC PANEL - Abnormal; Notable for the following components:   Potassium 5.2 (*)    CO2 21 (*)    Glucose, Bld 120 (*)    BUN 72 (*)    Creatinine, Ser 2.90 (*)    Calcium 8.3 (*)    Total Protein 6.3 (*)    Albumin 2.9 (*)    AST 64 (*)    ALT 50 (*)    Total Bilirubin 2.5 (*)    GFR calc non Af Amer 19 (*)    GFR calc Af Amer 22 (*)    All other components within normal limits  CBC WITH DIFFERENTIAL/PLATELET - Abnormal; Notable for the following components:   MCV 106.8 (*)    RDW 19.1 (*)    Platelets 86 (*)    nRBC 4.0 (*)    Abs Immature Granulocytes 0.11 (*)    All other components within normal limits  APTT - Abnormal; Notable for the following components:   aPTT 43 (*)    All other components within normal limits  PROTIME-INR - Abnormal; Notable for the following components:   Prothrombin Time 17.6 (*)    INR 1.5 (*)    All other components within normal limits   URINALYSIS, ROUTINE W REFLEX MICROSCOPIC - Abnormal; Notable for the following components:   APPearance TURBID (*)    Hgb urine dipstick LARGE (*)    Protein, ur 100 (*)    Leukocytes,Ua MODERATE (*)    All other components within normal limits  CBG MONITORING, ED - Abnormal; Notable for the following components:   Glucose-Capillary 106 (*)    All other components within normal limits  SARS CORONAVIRUS 2 ([facility] ORDER, [facility])  CULTURE, BLOOD (ROUTINE X 2)  CULTURE, BLOOD (ROUTINE X 2)  URINE CULTURE  LACTIC ACID, PLASMA    EKG EKG Interpretation  Date/Time:  Saturday December 20 2018 12:18:43 EDT Ventricular Rate:  121 PR Interval:    QRS Duration: 115 QT Interval:  352 QTC Calculation: 500 R Axis:   -43 Text Interpretation:  Atrial fibrillation Left anterior fascicular block Abnormal T, consider ischemia, lateral leads Confirmed by Milton Ferguson [phone]) on 12/29/2018 2:08:19 PM   Radiology Dg Chest Port 1 View  Result Date: 01/16/2019 CLINICAL DATA:  Shortness of breath. Left arm and right leg swelling. Bilateral leg cyanosis. Left arm dialysis access fistula. EXAM: PORTABLE CHEST 1 VIEW COMPARISON:  10/15/2018 FINDINGS: Again demonstrated is significant patient rotation to the right. Stable enlarged cardiac silhouette and tortuous aorta. Mildly increased mild bibasilar atelectasis. Interval small right lateral pleural effusion. No acute bony abnormality. IMPRESSION: 1. Mildly increased mild bibasilar atelectasis. 2. Interval small right lateral pleural effusion. 3. Stable cardiomegaly. Electronically Signed   By: Claudie Revering M.D.   On: 12/24/2018 13:46    Procedures Procedures (including critical care time)  Medications Ordered in ED Medications  vancomycin (VANCOCIN) IVPB  1000 mg/200 mL premix (1,000 mg Intravenous New Bag/Given 12/26/2018 1342)  sodium chloride 0.9 % bolus 1,000 mL (has no administration in time range)  albuterol (VENTOLIN  HFA) 108 (90 Base) MCG/ACT inhaler 6 puff (has no administration in time range)  ipratropium-albuterol (DUONEB) 0.5-2.5 (3) MG/3ML nebulizer solution 3 mL (has no administration in time range)  albuterol (PROVENTIL) (2.5 MG/3ML) 0.083% nebulizer solution 2.5 mg (has no administration in time range)  sodium chloride 0.9 % bolus 1,000 mL (1,000 mLs Intravenous New Bag/Given 12/18/2018 1249)  piperacillin-tazobactam (ZOSYN) IVPB 3.375 g (0 g Intravenous Stopped 01/10/2019 1342)  methylPREDNISolone sodium succinate (SOLU-MEDROL) 125 mg/2 mL injection 125 mg (125 mg Intravenous Given 01/15/2019 1308)     Initial Impression / Assessment and Plan / ED Course  I have reviewed the triage vital signs and the nursing notes.  Pertinent labs & imaging results that were available during my care of the patient were reviewed by me and considered in my medical decision making (see chart for details).    CRITICAL CARE Performed by: Bethann Berkshire Total critical care time:49minutes Critical care time was exclusive of separately billable procedures and treating other patients. Critical care was necessary to treat or prevent imminent or life-threatening deterioration. Critical care was time spent personally by me on the following activities: development of treatment plan with patient and/or surrogate as well as nursing, discussions with consultants, evaluation of patient's response to treatment, examination of patient, obtaining history from patient or surrogate, ordering and performing treatments and interventions, ordering and review of laboratory studies, ordering and review of radiographic studies, pulse oximetry and re-evaluation of patient's condition.     Patient with cardiomyopathy COPD and hypoxia.  Cover test is negative.  Patient will be admitted for hypoxia possibly secondary to COPD and cardiomyopathy.  Patient may have a PE also.  Although CT Angie of cannot be performed because of the patient's renal  function.  Medicine will admit  Final Clinical Impressions(s) / ED Diagnoses   Final diagnoses:  Dyspnea, unspecified type    ED Discharge Orders    None       Bethann Berkshire, MD 01/15/2019 1439

## 2018-12-20 NOTE — ED Triage Notes (Signed)
Patient brought to ED via EMS from [facility] for O2 sats in 70s. Patient still sating 78% on non-rebreather. Patient has significant swelling in left arm and right leg with cyanosis legs bilaterally. Patient has fistula in left arm due to kidney failure. Per [facility] staff patient has not had dialysis since he has been there in July because he refuses. Staff at [facility] also told paramedic that the swelling and cyanosis right leg started today. EDP in room assessing patient.

## 2018-12-21 DIAGNOSIS — I10 Essential (primary) hypertension: Secondary | ICD-10-CM

## 2018-12-21 NOTE — Progress Notes (Signed)
Pt's sister and niece at bedside. This RN discussed current plan with them, state understanding. No questions at this time. Number left to reach Billee Cashing, niece, in order to get in contact with pt's sister [phone]). Family state's that they live in [place].

## 2018-12-21 NOTE — Progress Notes (Signed)
PROGRESS NOTE    [name] Rice  KDT:[id number] DOB: 1935-04-27 DOA: 12/22/2018 PCP: Benita Stabile, MD    Brief Narrative:  83 year old male, long-term resident of skilled nursing facility, history of chronic kidney disease stage III, hypertension, diastolic heart failure, COPD, who is bedbound, brought to the hospital with worsening shortness of breath and cyanosis of his right foot.  Found to have acute respiratory failure, elevated lactic acid and ischemia of his right foot.  Possible urinary tract infection and sepsis.  Due to his multiple comorbidities and poor prognosis, after discussing with family and patient, decision was made to transition to comfort care.  Palliative care consult requested.   Assessment & Plan:   Active Problems:   Essential hypertension, benign   CKD (chronic kidney disease) stage 3, GFR 30-59 ml/min (HCC)   HOH (hard of hearing)   Paroxysmal atrial fibrillation (HCC)   COPD (chronic obstructive pulmonary disease) (HCC)   Acute respiratory failure with hypoxia (HCC)   Anasarca   Lactic acidosis   Ischemia of foot   Sepsis (HCC)   Functional quadriplegia (HCC)   Chronic diastolic CHF (congestive heart failure) (HCC)   AKI (acute kidney injury) (HCC)   Thrombocytopenia (HCC)   DIC (disseminated intravascular coagulation) (HCC)   1. Ischemia right foot.  Patient noted to have cyanosis of toes on the right foot.  No pulses were palpable.  He does have a history of atrial fibrillation and was not on chronic anticoagulation.  Further intervention such as surgery or revascularization were discussed with the patient's family as well as the patient.  Patient made it clear that he would not want any invasive procedures. 2. Sepsis.  Related to urinary tract infection.  He received a dose of antibiotics in the emergency room.  Last lactic acid was trending up. 3. Thrombocytopenia.  Suspect this is related to DIC from #1 and #2. 4. Acute kidney injury superimposed on  chronic kidney disease stage III.  Patient was previously on dialysis approximately 8 to 9 years ago prior to moving to [place].  He has repeatedly said that he would not want to go back on dialysis.  This was confirmed with the patient as well as his family on admission.  Curbside consult with nephrology, Dr. Allena Katz, patient would not be considered a candidate for further dialysis. 5. Failure to thrive/anasarca.  Likely related to decreased p.o. intake.  P.o. intake remains poor. 6. Acute respiratory failure with hypoxia.  Patient required nonrebreather on admission.  No evidence of pneumonia on chest x-ray.  Possibly element of COPD.  Appears comfortable at this time.  Oxygen has been weaned off.  Use morphine as needed for respiratory distress. 7. COPD.  No wheezing at this time.  Continue bronchodilators 8. Paroxysmal atrial fibrillation.  He is not on any chronic anticoagulation.  I suspect he may have had a cardioembolic event leading to ischemia of his foot 9. Chronic diastolic heart failure.  Patient has been taking oral Lasix prior to admission.  He continues to have diffuse anasarca. 10. Functional quadriplegia.  Patient has been nonambulatory for quite some time now.  He is developing contractures in his lower extremities. 11. Goals of care.  Several conversations with the patient's family including his cousin who related information to his sister.  All were in agreement that patient would not want aggressive/invasive measures.  They agreed to transition the patient to comfort measures.  We will consult palliative care to assist with further disposition, residential hospice  if appropriate.   DVT prophylaxis: None Code Status: DNR, comfort care Family Communication: None present Disposition Plan: Possible discharge to residential hospice   Consultants:   Palliative care  Procedures:     Antimicrobials:       Subjective: Patient is very hard of hearing.  He is somnolent,  but opens his eyes to voice.  Objective: Vitals:   12/31/2018 2015 12/22/2018 2112 01/01/2019 2122 12/21/18 0507  BP: 95/76 99/73  106/82  Pulse: (!) 59 (!) 102  88  Resp: (!) 22 17  19   Temp: (!) 97 F (36.1 C)  98 F (36.7 C) 98.5 F (36.9 C)  TempSrc:   Oral Oral  SpO2: (!) 88% (!) 72%  98%  Weight:      Height:        Intake/Output Summary (Last 24 hours) at 12/21/2018 1716 Last data filed at 12/21/2018 1443 Gross per 24 hour  Intake 3 ml  Output 100 ml  Net -97 ml   Filed Weights   12/31/2018 1220  Weight: 74.5 kg    Examination:  General exam: Appears calm and comfortable  Respiratory system: Clear to auscultation. Respiratory effort normal. Cardiovascular system: S1 & S2 heard, RRR. No JVD, murmurs, rubs, gallops or clicks. No pedal edema. Gastrointestinal system: Abdomen is nondistended, soft and nontender. No organomegaly or masses felt. Normal bowel sounds heard. Central nervous system:  No focal neurological deficits. Extremities: Venous stasis changes in lower extremities bilaterally.  Anasarca present.  Lower extremities developing contractures Skin: Toes on right foot remains cyanotic Psychiatry: Somnolent    Data Reviewed: I have personally reviewed following labs and imaging studies  CBC: Recent Labs  Lab 01/16/2019 1230  WBC 7.7  NEUTROABS 5.8  HGB 15.0  HCT 50.0  MCV 106.8*  PLT 86*   Basic Metabolic Panel: Recent Labs  Lab 12/27/2018 1230  NA 137  K 5.2*  CL 104  CO2 21*  GLUCOSE 120*  BUN 72*  CREATININE 2.90*  CALCIUM 8.3*   GFR: Estimated Creatinine Clearance: 18 mL/min (A) (by C-G formula based on SCr of 2.9 mg/dL (H)). Liver Function Tests: Recent Labs  Lab 12/19/2018 1230  AST 64*  ALT 50*  ALKPHOS 117  BILITOT 2.5*  PROT 6.3*  ALBUMIN 2.9*   No results for input(s): LIPASE, AMYLASE in the last 168 hours. No results for input(s): AMMONIA in the last 168 hours. Coagulation Profile: Recent Labs  Lab 12/28/2018 1230  INR 1.5*    Cardiac Enzymes: No results for input(s): CKTOTAL, CKMB, CKMBINDEX, TROPONINI in the last 168 hours. BNP (last 3 results) No results for input(s): PROBNP in the last 8760 hours. HbA1C: No results for input(s): HGBA1C in the last 72 hours. CBG: Recent Labs  Lab 12/31/2018 1227  GLUCAP 106*   Lipid Profile: No results for input(s): CHOL, HDL, LDLCALC, TRIG, CHOLHDL, LDLDIRECT in the last 72 hours. Thyroid Function Tests: No results for input(s): TSH, T4TOTAL, FREET4, T3FREE, THYROIDAB in the last 72 hours. Anemia Panel: No results for input(s): VITAMINB12, FOLATE, FERRITIN, TIBC, IRON, RETICCTPCT in the last 72 hours. Sepsis Labs: Recent Labs  Lab 01/15/2019 1230 12/21/2018 1439  LATICACIDVEN 3.3* 5.0*    Recent Results (from the past 240 hour(s))  Blood Culture (routine x 2)     Status: None (Preliminary result)   Collection Time: 12/21/2018 12:30 PM   Specimen: Right Antecubital; Blood  Result Value Ref Range Status   Specimen Description RIGHT ANTECUBITAL  Final   Special Requests  Final    BOTTLES DRAWN AEROBIC AND ANAEROBIC Blood Culture adequate volume   Culture   Final    NO GROWTH < 24 HOURS Performed at [facility], [address] [place], [place] [address]    Report Status PENDING  Incomplete  SARS Coronavirus 2 [facility](Hospital order, Performed in [facility] lab) Nasopharyngeal Nasopharyngeal Swab     Status: None   Collection Time: 01/10/2019  1:12 PM   Specimen: Nasopharyngeal Swab  Result Value Ref Range Status   SARS Coronavirus 2 NEGATIVE NEGATIVE Final    Comment: (NOTE) If result is NEGATIVE SARS-CoV-2 target nucleic acids are NOT DETECTED. The SARS-CoV-2 RNA is generally detectable in upper and lower  respiratory specimens during the acute phase of infection. The lowest  concentration of SARS-CoV-2 viral copies this assay can detect is 250  copies / mL. A negative result does not preclude SARS-CoV-2 infection  and should not be used as the sole basis  for treatment or other  patient management decisions.  A negative result may occur with  improper specimen collection / handling, submission of specimen other  than nasopharyngeal swab, presence of viral mutation(s) within the  areas targeted by this assay, and inadequate number of viral copies  (<250 copies / mL). A negative result must be combined with clinical  observations, patient history, and epidemiological information. If result is POSITIVE SARS-CoV-2 target nucleic acids are DETECTED. The SARS-CoV-2 RNA is generally detectable in upper and lower  respiratory specimens dur ing the acute phase of infection.  Positive  results are indicative of active infection with SARS-CoV-2.  Clinical  correlation with patient history and other diagnostic information is  necessary to determine patient infection status.  Positive results do  not rule out bacterial infection or co-infection with other viruses. If result is PRESUMPTIVE POSTIVE SARS-CoV-2 nucleic acids MAY BE PRESENT.   A presumptive positive result was obtained on the submitted specimen  and confirmed on repeat testing.  While 2019 novel coronavirus  (SARS-CoV-2) nucleic acids may be present in the submitted sample  additional confirmatory testing may be necessary for epidemiological  and / or clinical management purposes  to differentiate between  SARS-CoV-2 and other Sarbecovirus currently known to infect humans.  If clinically indicated additional testing with an alternate test  methodology [phone]([id number]) is advised. The SARS-CoV-2 RNA is generally  detectable in upper and lower respiratory sp ecimens during the acute  phase of infection. The expected result is Negative. Fact Sheet for Patients:  BoilerBrush.com.cyhttps://www.fda.gov/media/136312/download Fact Sheet for Healthcare Providers: https://pope.com/https://www.fda.gov/media/136313/download This test is not yet approved or cleared by the [place] FDA and has been authorized for detection and/or  diagnosis of SARS-CoV-2 by FDA under an Emergency Use Authorization (EUA).  This EUA will remain in effect (meaning this test can be used) for the duration of the COVID-19 declaration under Section 564(b)(1) of the Act, 21 U.S.C. section 360bbb-3(b)(1), unless the authorization is terminated or revoked sooner. Performed at [facility], [address] [place], [place] [address]   Blood Culture (routine x 2)     Status: None (Preliminary result)   Collection Time: 01/05/2019  1:20 PM   Specimen: BLOOD RIGHT WRIST  Result Value Ref Range Status   Specimen Description BLOOD RIGHT WRIST  Final   Special Requests   Final    AEROBIC BOTTLE ONLY Blood Culture results may not be optimal due to an inadequate volume of blood received in culture bottles   Culture   Final  NO GROWTH < 24 HOURS Performed at [facility], [address], [place], [place] [address]    Report Status PENDING  Incomplete         Radiology Studies: Dg Chest Port 1 View  Result Date: 01/11/2019 CLINICAL DATA:  Shortness of breath. Left arm and right leg swelling. Bilateral leg cyanosis. Left arm dialysis access fistula. EXAM: PORTABLE CHEST 1 VIEW COMPARISON:  10/15/2018 FINDINGS: Again demonstrated is significant patient rotation to the right. Stable enlarged cardiac silhouette and tortuous aorta. Mildly increased mild bibasilar atelectasis. Interval small right lateral pleural effusion. No acute bony abnormality. IMPRESSION: 1. Mildly increased mild bibasilar atelectasis. 2. Interval small right lateral pleural effusion. 3. Stable cardiomegaly. Electronically Signed   By: Claudie Revering M.D.   On: 12/19/2018 13:46        Scheduled Meds:  sodium chloride flush  3 mL Intravenous Q12H   Continuous Infusions:  sodium chloride       LOS: 1 day    Time spent: 9mins    Kathie Dike, MD [facility]   If 7PM-7AM, please contact night-coverage [email or web address]  12/21/2018, 5:16 PM

## 2018-12-22 DIAGNOSIS — R601 Generalized edema: Secondary | ICD-10-CM

## 2018-12-22 DIAGNOSIS — N183 Chronic kidney disease, stage 3 unspecified: Secondary | ICD-10-CM

## 2018-12-22 MED ORDER — MORPHINE SULFATE (CONCENTRATE) 10 MG/0.5ML PO SOLN
5.0000 mg | ORAL | Status: DC | PRN
  Filled 2018-12-22: qty 0.5

## 2018-12-22 NOTE — Progress Notes (Signed)
Nutrition Brief Note  Chart reviewed. Pt now transitioning to comfort care.  No further nutrition interventions warranted at this time.  Please re-consult as needed.   Lynn Ollen Rao MS,RD,CSG,LDN Office: #[phone] Pager: #[phone]    

## 2018-12-22 NOTE — Progress Notes (Signed)
PMT consult received, chart reviewed and discussed with Dr. Denton Brick. Patient appears comfortable during visit. Sleeping. No family at bedside. Comfort meds on MAR.  Attempted to call cousin, Gerald Leitz, x2. No answer and unable to leave voicemail. Will ask RN to page PMT provider if family arrives to bedside.    NO CHARGE  Ihor Dow, [place], FNP-C Palliative Medicine Team  Phone: [phone] Fax: [phone]

## 2018-12-22 NOTE — Care Management Important Message (Signed)
Important Message  Patient Details  Name: [name] Rice MRN: [id number] Date of Birth: 14-Aug-1935   Medicare Important Message Given:  Yes     Tommy Medal 12/22/2018, 2:34 PM

## 2018-12-22 NOTE — Progress Notes (Signed)
PROGRESS NOTE    [name] Rice  TFT:[id number] DOB: Aug 05, 1935 DOA: 27-Dec-2018 PCP: Celene Squibb, MD    Brief Narrative:  83 year old male, long-term resident of skilled nursing facility, history of chronic kidney disease stage III, hypertension, diastolic heart failure, COPD, who is bedbound, brought to the hospital with worsening shortness of breath and cyanosis of his right foot.  Found to have acute respiratory failure, elevated lactic acid and ischemia of his right foot.  Possible urinary tract infection and sepsis.  Due to his multiple comorbidities and poor prognosis, after discussing with family and patient, decision was made to transition to comfort care.  Palliative care consult requested. - May be appropriate for residential hospice otherwise transfer to SNF with comfort care after palliative care input/consult with family agreement  Assessment & Plan:   Active Problems:   Essential hypertension, benign   CKD (chronic kidney disease) stage 3, GFR 30-59 ml/min (HCC)   HOH (hard of hearing)   Paroxysmal atrial fibrillation (HCC)   COPD (chronic obstructive pulmonary disease) (HCC)   Acute respiratory failure with hypoxia (HCC)   Anasarca   Lactic acidosis   Ischemia of foot   Sepsis (HCC)   Functional quadriplegia (HCC)   Chronic diastolic CHF (congestive heart failure) (HCC)   AKI (acute kidney injury) (HCC)   Thrombocytopenia (HCC)   DIC (disseminated intravascular coagulation) ([place])   1. Ischemia right foot.  Patient noted to have cyanosis of toes on the right foot.  No pulses were palpable.  He does have a history of atrial fibrillation and was not on chronic anticoagulation.  Further intervention such as surgery or revascularization were discussed with the patient's family as well as the patient.  Patient made it clear that he would not want any invasive procedures. 2. Sepsis.  Related to urinary tract infection.  He received a dose of antibiotics in the emergency  room.  Last lactic acid was trending up. 3. Thrombocytopenia.  Suspect this is related to DIC from #1 and #2. 4. Acute kidney injury superimposed on chronic kidney disease stage III.  Patient was previously on dialysis approximately 8 to 9 years ago prior to moving to [place].  He has repeatedly said that he would not want to go back on dialysis.  This was confirmed with the patient as well as his family on admission.  Curbside consult with nephrology, Dr. Posey Pronto, patient would not be considered a candidate for further dialysis. -Comfort care measures at this time, awaiting possible transfer to residential hospice versus SNF with comfort care 5. Failure to thrive/anasarca.  Likely related to decreased p.o. intake.  P.o. intake remains poor. 6. Acute respiratory failure with hypoxia.  Patient required nonrebreather on admission.  No evidence of pneumonia on chest x-ray.  Possibly element of COPD.  Appears comfortable at this time.  Oxygen has been weaned off.  Use morphine as needed for respiratory distress. 7. COPD.  No wheezing at this time.  Continue bronchodilators 8. Paroxysmal atrial fibrillation.  He is not on any chronic anticoagulation.  I suspect he may have had a cardioembolic event leading to ischemia of his foot 9. Chronic diastolic heart failure.  Patient has been taking oral Lasix prior to admission.  He continues to have diffuse anasarca. 10. Functional quadriplegia.  Patient has been nonambulatory for quite some time now.  He is developing contractures in his lower extremities. 11. Goals of care- Dr Roderic Palau had Several conversations with the patient's family including his cousin who related  information to his sister.  All were in agreement that patient would not want aggressive/invasive measures.  They agreed to transition the patient to comfort measures.  Awaiting palliative care to assist with further disposition, residential hospice if appropriate. -May be appropriate for  residential hospice otherwise transfer to SNF with comfort care after palliative care input/consult with family agreement -Discussed with Vennie HomansMegan Mason  from palliative care services  DVT prophylaxis: None Code Status: DNR, comfort care Family Communication: None present Disposition Plan: Possible discharge to residential hospice Vs SNF with comfort care measures   Consultants:   Palliative care  Procedures:     Antimicrobials:       Subjective: -Resting comfortably, no new complaints\ -No fevers  Objective: Vitals:   09-05-2018 2112 09-05-2018 2122 12/21/18 0507 12/22/18 1100  BP: 99/73  106/82   Pulse: (!) 102  88 (!) 108  Resp: 17  19   Temp:  98 F (36.7 C) 98.5 F (36.9 C)   TempSrc:  Oral Oral   SpO2: (!) 72%  98% (!) 85%  Weight:      Height:       No intake or output data in the 24 hours ending 12/22/18 1556 Filed Weights   09-05-2018 1220  Weight: 74.5 kg    Examination:  General exam: Appears calm and comfortable  Ears- HOH Respiratory system: Clear to auscultation. Respiratory effort normal. Cardiovascular system: S1 & S2 heard, RRR. No JVD, murmurs, rubs, gallops or clicks. No pedal edema. Gastrointestinal system: Abdomen is nondistended, soft and nontender. No organomegaly or masses felt. Normal bowel sounds heard. Central nervous system:  No focal neurological deficits. Extremities: Venous stasis changes in lower extremities bilaterally.  Anasarca present.  Lower extremities developing contractures Skin: Toes on right foot remains cyanotic Psychiatry: Somnolent    Data Reviewed:   CBC: Recent Labs  Lab 09-05-2018 1230  WBC 7.7  NEUTROABS 5.8  HGB 15.0  HCT 50.0  MCV 106.8*  PLT 86*   Basic Metabolic Panel: Recent Labs  Lab 09-05-2018 1230  NA 137  K 5.2*  CL 104  CO2 21*  GLUCOSE 120*  BUN 72*  CREATININE 2.90*  CALCIUM 8.3*   GFR: Estimated Creatinine Clearance: 18 mL/min (A) (by C-G formula based on SCr of 2.9 mg/dL (H)).  Liver Function Tests: Recent Labs  Lab 09-05-2018 1230  AST 64*  ALT 50*  ALKPHOS 117  BILITOT 2.5*  PROT 6.3*  ALBUMIN 2.9*   No results for input(s): LIPASE, AMYLASE in the last 168 hours. No results for input(s): AMMONIA in the last 168 hours. Coagulation Profile: Recent Labs  Lab 09-05-2018 1230  INR 1.5*   Cardiac Enzymes: No results for input(s): CKTOTAL, CKMB, CKMBINDEX, TROPONINI in the last 168 hours. BNP (last 3 results) No results for input(s): PROBNP in the last 8760 hours. HbA1C: No results for input(s): HGBA1C in the last 72 hours. CBG: Recent Labs  Lab 09-05-2018 1227  GLUCAP 106*   Lipid Profile: No results for input(s): CHOL, HDL, LDLCALC, TRIG, CHOLHDL, LDLDIRECT in the last 72 hours. Thyroid Function Tests: No results for input(s): TSH, T4TOTAL, FREET4, T3FREE, THYROIDAB in the last 72 hours. Anemia Panel: No results for input(s): VITAMINB12, FOLATE, FERRITIN, TIBC, IRON, RETICCTPCT in the last 72 hours. Sepsis Labs: Recent Labs  Lab 09-05-2018 1230 09-05-2018 1439  LATICACIDVEN 3.3* 5.0*    Recent Results (from the past 240 hour(s))  Urine culture     Status: Abnormal (Preliminary result)   Collection Time: 09-05-2018  12:20 PM   Specimen: Urine, Catheterized  Result Value Ref Range Status   Specimen Description   Final    URINE, CATHETERIZED Performed at [facility], [address], [place], [place] [address]    Special Requests   Final    NONE Performed at [facility], [address] [place], [place] [address]    Culture >=100,000 COLONIES/mL ESCHERICHIA COLI (A)  Final   Report Status PENDING  Incomplete  Blood Culture (routine x 2)     Status: None (Preliminary result)   Collection Time: 12/28/2018 12:30 PM   Specimen: Right Antecubital; Blood  Result Value Ref Range Status   Specimen Description RIGHT ANTECUBITAL  Final   Special Requests   Final    BOTTLES DRAWN AEROBIC AND ANAEROBIC Blood Culture adequate volume   Culture   Final    NO  GROWTH 2 DAYS Performed at [facility], [address] [place], [place] [address]    Report Status PENDING  Incomplete  SARS Coronavirus 2 [facility] order, Performed in [facility] hospital lab) Nasopharyngeal Nasopharyngeal Swab     Status: None   Collection Time: 12/29/2018  1:12 PM   Specimen: Nasopharyngeal Swab  Result Value Ref Range Status   SARS Coronavirus 2 NEGATIVE NEGATIVE Final    Comment: (NOTE) If result is NEGATIVE SARS-CoV-2 target nucleic acids are NOT DETECTED. The SARS-CoV-2 RNA is generally detectable in upper and lower  respiratory specimens during the acute phase of infection. The lowest  concentration of SARS-CoV-2 viral copies this assay can detect is 250  copies / mL. A negative result does not preclude SARS-CoV-2 infection  and should not be used as the sole basis for treatment or other  patient management decisions.  A negative result may occur with  improper specimen collection / handling, submission of specimen other  than nasopharyngeal swab, presence of viral mutation(s) within the  areas targeted by this assay, and inadequate number of viral copies  (<250 copies / mL). A negative result must be combined with clinical  observations, patient history, and epidemiological information. If result is POSITIVE SARS-CoV-2 target nucleic acids are DETECTED. The SARS-CoV-2 RNA is generally detectable in upper and lower  respiratory specimens dur ing the acute phase of infection.  Positive  results are indicative of active infection with SARS-CoV-2.  Clinical  correlation with patient history and other diagnostic information is  necessary to determine patient infection status.  Positive results do  not rule out bacterial infection or co-infection with other viruses. If result is PRESUMPTIVE POSTIVE SARS-CoV-2 nucleic acids MAY BE PRESENT.   A presumptive positive result was obtained on the submitted specimen  and confirmed on repeat testing.  While 2019 novel  coronavirus  (SARS-CoV-2) nucleic acids may be present in the submitted sample  additional confirmatory testing may be necessary for epidemiological  and / or clinical management purposes  to differentiate between  SARS-CoV-2 and other Sarbecovirus currently known to infect humans.  If clinically indicated additional testing with an alternate test  methodology [phone]) is advised. The SARS-CoV-2 RNA is generally  detectable in upper and lower respiratory sp ecimens during the acute  phase of infection. The expected result is Negative. Fact Sheet for Patients:  BoilerBrush.com.cy Fact Sheet for Healthcare Providers: https://pope.com/ This test is not yet approved or cleared by the [place] FDA and has been authorized for detection and/or diagnosis of SARS-CoV-2 by FDA under an Emergency Use Authorization (EUA).  This EUA will remain in effect (meaning this  test can be used) for the duration of the COVID-19 declaration under Section 564(b)(1) of the Act, 21 U.S.C. section 360bbb-3(b)(1), unless the authorization is terminated or revoked sooner. Performed at [facility], [address] [place], [place] [address]   Blood Culture (routine x 2)     Status: None (Preliminary result)   Collection Time: 2019/01/10  1:20 PM   Specimen: BLOOD RIGHT WRIST  Result Value Ref Range Status   Specimen Description BLOOD RIGHT WRIST  Final   Special Requests   Final    AEROBIC BOTTLE ONLY Blood Culture results may not be optimal due to an inadequate volume of blood received in culture bottles   Culture   Final    NO GROWTH 2 DAYS Performed at [facility], [address], [place], [place] [address]    Report Status PENDING  Incomplete     Radiology Studies: No results found.  Scheduled Meds: . sodium chloride flush  3 mL Intravenous Q12H   Continuous Infusions: . sodium chloride       LOS: 2 days   Shon Hale, MD [facility]  If 7PM-7AM, please contact night-coverage [email or web address]  12/22/2018, 3:56 PM

## 2018-12-23 DIAGNOSIS — J9601 Acute respiratory failure with hypoxia: Secondary | ICD-10-CM

## 2018-12-23 DIAGNOSIS — N390 Urinary tract infection, site not specified: Secondary | ICD-10-CM | POA: Diagnosis present

## 2018-12-23 DIAGNOSIS — Z515 Encounter for palliative care: Secondary | ICD-10-CM

## 2018-12-23 DIAGNOSIS — D696 Thrombocytopenia, unspecified: Secondary | ICD-10-CM

## 2018-12-23 DIAGNOSIS — B9629 Other Escherichia coli [E. coli] as the cause of diseases classified elsewhere: Secondary | ICD-10-CM | POA: Diagnosis present

## 2018-12-23 DIAGNOSIS — R6520 Severe sepsis without septic shock: Secondary | ICD-10-CM

## 2018-12-23 DIAGNOSIS — D65 Disseminated intravascular coagulation [defibrination syndrome]: Secondary | ICD-10-CM

## 2018-12-23 DIAGNOSIS — N179 Acute kidney failure, unspecified: Secondary | ICD-10-CM

## 2018-12-23 DIAGNOSIS — I998 Other disorder of circulatory system: Secondary | ICD-10-CM

## 2018-12-23 DIAGNOSIS — I5032 Chronic diastolic (congestive) heart failure: Secondary | ICD-10-CM

## 2018-12-23 DIAGNOSIS — A419 Sepsis, unspecified organism: Secondary | ICD-10-CM

## 2018-12-23 LAB — URINE CULTURE: Culture: >=100000 — AB

## 2018-12-25 LAB — CULTURE, BLOOD (ROUTINE X 2)
Culture: NO GROWTH
Culture: NO GROWTH
Special Requests: ADEQUATE

## 2019-01-18 NOTE — TOC Transition Note (Signed)
Transition of Care [facility]) - CM/SW Discharge Note   Patient Details  Name: [name] Rice MRN: [id number] Date of Birth: 08-29-1935  Transition of Care [facility]) CM/SW Contact:  Athena Baltz, Chauncey Reading, RN Phone Number: 01/12/2019, 11:28 AM   Clinical Narrative:   Patient is a long term resident at [facility]. He and family has elected comfort care/no agressive treatments. Discussed with Jackelyn Poling of [place].  Palliative has been consulted and talked with family who elects referral to [facility].   Final next level of care: [place] Barriers to Discharge: No Barriers Identified    Readmission Risk Interventions Readmission Risk Prevention Plan 12/21/2018  Transportation Screening Complete  PCP or Specialist Appt within 3-5 Days Not Complete  Not Complete comments transferring to hospice  HRI or [place] Not Complete  HRI or Home Care Consult comments transferring to hospice facility  Social Work Consult for [place] Planning/Counseling Complete  Palliative Care Screening Complete  Medication Review [employer]) Complete  Some recent data might be hidden

## 2019-01-18 NOTE — Progress Notes (Signed)
Daily Progress Note   Patient Name: [name] Rice       Date: Dec 31, 2018 DOB: October 03, 1935  Age: 83 y.o. MRN#: [id number] Attending Physician: Shon Hale, MD Primary Care Physician: Benita Stabile, MD Admit Date: 01/01/2019  Reason for Consultation/Follow-up: Establishing goals of care and Terminal Care  Subjective: Patient opens eyes to voice. He appears uncomfortable with facial grimacing. He reports that his right foot/leg is hurting/cramping him. Assisted with repositioning. Notified RN to give prn morphine. Attempted to further discuss plan of care with patient--he does not engage in discussion.   Multiple calls placed to cousin, [name]. No answer and unable to leave vm. Voicemail left for relative, Karena Addison to have [name] contact me.   ADDENDUM 1110: Spoke to [place] who notified [name] to call palliative NP. Spoke with [name] and provided update on plan of care. Confirmed focus on comfort measures and discussed EOL expectations and prognosis. Discussed hospice options and philosophy. [name] has spoken to the patient's sister, Dewayne Hatch, and the family chooses hospice facility placement for EOL care. Answered questions and concerns. Family has PMT contact information.   Updated Attending and RN CM.   Length of Stay: 3  Current Medications: Scheduled Meds:  . sodium chloride flush  3 mL Intravenous Q12H    Continuous Infusions: . sodium chloride      PRN Meds: sodium chloride, acetaminophen **OR** acetaminophen, acetaminophen **OR** acetaminophen, antiseptic oral rinse, glycopyrrolate **OR** glycopyrrolate **OR** glycopyrrolate, haloperidol **OR** haloperidol **OR** haloperidol lactate, ipratropium-albuterol, morphine injection, morphine CONCENTRATE, ondansetron **OR** ondansetron (ZOFRAN) IV,  polyvinyl alcohol, sodium chloride flush  Physical Exam Vitals signs and nursing note reviewed.  Constitutional:      Appearance: He is ill-appearing.  HENT:     Head: Normocephalic and atraumatic.  Cardiovascular:     Heart sounds: Normal heart sounds.  Pulmonary:     Effort: No tachypnea, accessory muscle usage or respiratory distress.     Breath sounds: Normal breath sounds.  Skin:    Comments: Right foot cyanosis/cool  Neurological:     Mental Status: He is easily aroused.     Comments: Wakes to voice. Oriented to person/place. C/o or right foot/leg pain. Instructed RN to give prn morphine. Otherwise, patient does not engage in conversation.   Psychiatric:        Attention and Perception: He  is inattentive.        Speech: Speech is delayed.            Vital Signs: BP (!) 71/53 (BP Location: Right Arm)   Pulse 78   Temp 98.5 F (36.9 C) (Oral)   Resp (!) 21   Ht 5\' 6"  (1.676 m)   Wt 74.5 kg   SpO2 (!) 84%   BMI 26.51 kg/m  SpO2: SpO2: (!) 84 % O2 Device: O2 Device: Room Air O2 Flow Rate: O2 Flow Rate (L/min): 15 L/min  Intake/output summary:   Intake/Output Summary (Last 24 hours) at 12/30/2018 1002 Last data filed at 12/24/2018 0840 Gross per 24 hour  Intake 120 ml  Output 50 ml  Net 70 ml   LBM: Last BM Date: 12/21/18 Baseline Weight: Weight: 74.5 kg Most recent weight: Weight: 74.5 kg       Palliative Assessment/Data: PPS 20%      Patient Active Problem List   Diagnosis Date Noted  . Acute respiratory failure with hypoxia (HCC) 05-Jan-2019  . Anasarca 2019/01/05  . Lactic acidosis 2019-01-05  . Ischemia of foot 01/05/2019  . Sepsis (HCC) 05-Jan-2019  . Functional quadriplegia (HCC) 01/05/2019  . Chronic diastolic CHF (congestive heart failure) (HCC) 2019-01-05  . AKI (acute kidney injury) (HCC) 2019/01/05  . Thrombocytopenia (HCC) 01-05-19  . DIC (disseminated intravascular coagulation) (HCC) 01/05/2019  . HCAP (healthcare-associated pneumonia)  10/15/2018  . HOH (hard of hearing)   . Paroxysmal atrial fibrillation (HCC)   . COPD (chronic obstructive pulmonary disease) (HCC)   . Edema 04/02/2012  . CKD (chronic kidney disease) stage 3, GFR 30-59 ml/min (HCC) 02/17/2009  . HYPERLIPIDEMIA 08/20/2008  . Essential hypertension, benign 08/20/2008  . CORONARY ATHEROSCLEROSIS NATIVE CORONARY ARTERY 08/20/2008  . NICM (nonischemic cardiomyopathy) (HCC) 08/20/2008    Palliative Care Assessment & Plan   Patient Profile: 83 year old male, long-term resident of skilled nursing facility, history of chronic kidney disease stage III, hypertension, diastolic heart failure, COPD, who is bedbound, brought to the hospital with worsening shortness of breath and cyanosis of his right foot.  Found to have acute respiratory failure, elevated lactic acid and ischemia of his right foot.  Possible urinary tract infection and sepsis.  Due to his multiple comorbidities and poor prognosis, after discussing with family and patient, decision was made to transition to comfort care.  Palliative care consult requested for EOL care.  Assessment: Right foot ischemia Sepsis AKI with underlying CKD stage III Thrombocytopenia, suspect DIC FTT Anasarca Acute respiratory failure with hypoxia Hx COPD Hx chronic dCHF Functional quadriplegia  Recommendations/Plan:  Comfort measures only.   Continue prn comfort medications on MAR.  Comfort feeds per patient/family request.  SW consult for residential hospice placement.   Code Status: DNR    Code Status Orders  (From admission, onward)         Start     Ordered   01-05-19 2225  Do not attempt resuscitation (DNR)  Continuous    Question Answer Comment  In the event of cardiac or respiratory ARREST Do not call a "code blue"   In the event of cardiac or respiratory ARREST Do not perform Intubation, CPR, defibrillation or ACLS   In the event of cardiac or respiratory ARREST Use medication by any route,  position, wound care, and other measures to relive pain and suffering. May use oxygen, suction and manual treatment of airway obstruction as needed for comfort.      January 05, 2019 2224  Code Status History    Date Active Date Inactive Code Status Order ID Comments User Context   12/27/2018 2224 27-Dec-2018 2224 DNR [id number]  Kathie Dike, MD Inpatient   Dec 27, 2018 1526 2018-12-27 2224 DNR [id number]  Kathie Dike, MD ED   10/15/2018 2256 10/17/2018 2126 Full Code [id number]  Vashti Hey, MD Inpatient   08/23/2011 1505 08/27/2011 1453 Full Code [id number]  Isac Caddy Inpatient   Advance Care Planning Activity       Prognosis:   < 2 weeks: likely days  Discharge Planning:  Hospice facility  Care plan was discussed with patient, Dr. Denton Brick, RN, RN CM, family members-[name] and Murray Hodgkins  Thank you for allowing the Palliative Medicine Team to assist in the care of this patient.   Time In: 0940- 1100- Time Out: 1000 1115 Total Time 35 Prolonged Time Billed no      Greater than 50%  of this time was spent counseling and coordinating care related to the above assessment and plan.  Ihor Dow, DNP, FNP-C Palliative Medicine Team  Phone: [phone] Fax: [phone]  Please contact Palliative Medicine Team phone at [phone] for questions and concerns.

## 2019-01-18 NOTE — Discharge Summary (Signed)
[name]:[id number] DOB: July 02, 1935 DOA: 14-Jan-2019  PCP: Benita Stabile, MD PCP/Office notified:   Admit date: 01-14-2019 Date of Death: 01/17/2019  Final Diagnoses:  Principal Problem:   Ischemia of foot Active Problems:   Sepsis (HCC)   AKI (acute kidney injury) (HCC)   Palliative care by specialist   Terminal care   UTI due to extended-spectrum beta lactamase (ESBL) producing Escherichia coli   Essential hypertension, benign   CKD (chronic kidney disease) stage 3, GFR 30-59 ml/min (HCC)   HOH (hard of hearing)   Paroxysmal atrial fibrillation (HCC)   COPD (chronic obstructive pulmonary disease) (HCC)   Acute respiratory failure with hypoxia (HCC)   Anasarca   Lactic acidosis   Functional quadriplegia (HCC)   Chronic diastolic CHF (congestive heart failure) (HCC)   Thrombocytopenia (HCC)   DIC (disseminated intravascular coagulation) (HCC)   History of present illness:   Chief Complaint: Shortness of breath  HPI: [name] Rice is a 83 y.o. male with medical history significant of COPD, diastolic heart failure, hypertension, chronic kidney disease stage III, who is chronically bedbound and resident of a nursing home for the last several years.  Patient is extremely hard of hearing and is unable to provide any meaningful history.  History is obtained from medical record as well as speaking to his emergency contact, his cousin [name].  Report reports that she has been his main caregiver and decision maker.  He is brought to the hospital with worsening shortness of breath.  Nursing home he was noted to have oxygen saturations in the 70s.  He was placed on a nonrebreather.  Review reports that she was told by nursing home staff that his right foot had appeared to be cyanotic.  It is unclear when cyanosis began, but per nursing home staff it was present yesterday.  ED Course: On arrival to the emergency room, patient was noted to be hypoxic and was placed on nonrebreather.   Oxygen saturations improved to the low 90s.  He is tachycardic with a heart rate in the 120s in atrial fibrillation.  Blood pressures in the low 100s.  Lactic acid elevated at 3.3.  He received a liter of IV fluids and subsequent lactic acid increased to 5.0.  Chest x-ray did not show any clear evidence of infiltrate.  Urinalysis does indicate possible infection.  Baseline creatinine appears to be around 1.3, and ER today noted to be elevated at 2.9 with a BUN of 72.  Potassium mildly elevated at 5.2.  Hemoglobin is elevated at 15 on baseline hemoglobin is approximately 11-12.  Platelets are low at 86.  INR is elevated at 1.5.  Patient was started on intravenous antibiotics for possible sepsis he has been referred for admission.  Hospital Course:  -Brief Narrative:  83 year old male, long-term resident of skilled nursing facility, history of chronic kidney disease stage III, hypertension, diastolic heart failure, COPD, who is bedbound, brought to the hospital with worsening shortness of breath and cyanosis of his right foot.  Found to have acute respiratory failure, elevated lactic acid and ischemia of his right foot.  Possible urinary tract infection and sepsis.  Due to his multiple comorbidities and poor prognosis, after discussing with family and patient, decision was made to transition to comfort care.  Palliative care consult requested. - Palliative care consult appreciated, patient and family decided on full comfort measures, Patient expired while awaiting possible transfer to residential hospice.   A/p --Ischemia right foot/Gangrene.  Patient noted to have  cyanosis of toes on the right foot.  No pulses were palpable.  He does have a history of atrial fibrillation and was not on chronic anticoagulation.  Further intervention such as surgery or revascularization were discussed with the patient's family as well as the patient.  Patient made it clear that he would not want any invasive  procedures. -Palliative care consult appreciated, patient and family decided on full comfort measures, Patient expired while awaiting possible transfer to residential hospice.   E. coli ESBL sepsis-  Related to urinary tract infection.  -Patient was comfort measures  Thrombocytopenia.  Suspect this is related to DIC from #1 and #2.  Acute kidney injury superimposed on chronic kidney disease stage III.  Patient was previously on dialysis approximately 8 to 9 years ago prior to moving to [place].  He has repeatedly said that he would not want to go back on dialysis.  This was confirmed with the patient as well as his family on admission.  Curbside consult with nephrology, Dr. Posey Pronto, patient would not be considered a candidate for further dialysis. -Palliative care consult appreciated, patient and family decided on full comfort measures, Patient expired while awaiting possible transfer to residential hospice.   Acute respiratory failure with hypoxia.  Patient required nonrebreather on admission.  No evidence of pneumonia on chest x-ray.  Possibly element of COPD.  Appears comfortable at this time.  Oxygen has been weaned off.  Use morphine as needed for respiratory distress.  COPD.  No wheezing at this time.  Continue bronchodilators  Paroxysmal atrial fibrillation.  He is not on any chronic anticoagulation.  I suspect he may have had a cardioembolic event leading to ischemia of his foot  Chronic diastolic heart failure.  Patient has been taking oral Lasix prior to admission.  He continues to have diffuse anasarca.  Functional quadriplegia.  Patient has been nonambulatory for quite some time now.  He is developing contractures in his lower extremities.  Social/ethics-Goals of care- Dr Roderic Palau had Several conversations with the patient's family including his cousin who related information to his sister.  All were in agreement that patient would not want aggressive/invasive measures.  They  agreed to transition the patient to comfort measures.  -Palliative care consult appreciated, patient and family decided on full comfort measures, Patient expired while awaiting possible transfer to residential hospice. -Discussed with Ihor Dow  from palliative care services  Code Status: DNR, comfort care Family Communication:  Margarita Grizzle and Bertram Millard Disposition - Palliative care consult appreciated, patient and family decided on full comfort measures, Patient expired while awaiting possible transfer to residential hospice.   Consultants:   Palliative care  Time: 12/25/2018 at 1200 (noon)  Signed:  Roxan Hockey  [facility] 12/21/2018, 12:26 PM

## 2019-01-18 NOTE — Progress Notes (Signed)
Pt DNR: found with no B/P, pulse, or resp at 1200. Cousin [name] at bedside. Dr. Denton Brick notified.

## 2019-01-18 NOTE — Care Management (Signed)
Patient Information  Patient Name  [name] Rice, [name] Rice ([id number]) Sex  Male DOB  [birth year]  Room Bed  A328 A328-01  Patient Demographics  Address  [address]  [place] [address] Phone  [phone] (Home)  Patient Ethnicity & Race  Ethnic Group Patient Race  Not Hispanic or Latino White or Caucasian  Emergency Contact(s)  Name Relation Home Work Mobile  Dockery,[name] Relative [place] Relative [phone]    Macie Burows  [phone]   Documents on File   Status Date Received Description  Documents for the Patient  EMR Medication Summary Not Received    EMR Problem Summary Not Received    EMR Patient Summary Not Received    Driver's License Not Received    [place] Received 05/04/10   [place] E-Signature HIPAA Notice of Privacy Received 09/13/11   [place] E-Signature HIPAA Notice of Privacy Spanish Not Received    Advance Directives/Living Will/HCPOA/POA Not Received    [place] Not Received    Historic Radiology Documentation Not Received    Insurance Card Received 09/16/14   Financial Application Not Received    Insurance Card Not Received    Insurance Card Not Received    Advanced Beneficiary Notice (ABN) Not Received    Insurance Card Received 04/03/13 aarp [id number]   Driver's License Not Received    Driver's License Received 27/06/23   HIM ROI Authorization  01/06/14   Insurance Card Received 04/19/14 united with medicare  Other Photo ID Not Received    Insurance Card Received 08/19/14 [facility] MEDICARE  HIM ROI Authorization (Expired) 11/03/14 Authorization for batch CIOX/[employer]     fbg     11/03/14  AMB Correspondence  11/10/14 OFFICE NOTE [facility] MEDICAL & KIDNEY CARE  AMB Intake Forms/Questionnaires  06/13/15 3/17 [facility] REFERRAL FROM MD  AMB Intake Forms/Questionnaires  06/13/15 3/17  [facility] REFERRAL FROM MD  Insurance Card   Insurance Card/APOUTPT REHAB  HIM ROI Authorization (Expired) 08/22/15 Authorization for batch CIOX/[employer] Medicare Risk Adjustment Review 08/22/15 fbg  HIM ROI Authorization  10/27/15 [facility]--10/20/15 prog note to PCP (hall)  HIM ROI Authorization (Expired) 09/24/16 Authorization for batch Ciox/[employer] Medicare Risk Adjustment Review mwc 09/21/2016  HIM ROI Authorization (Expired) 10/09/16 Authorization for batch CIOX/[employer] Medicare Risk Adjustment Review   fbg   10/09/16  HIM ROI Authorization Received 76/28/31 [place] OF PRIVACY - Scanned Received 2018/12/24 2020  AMB Correspondence (Deleted) 11/10/14 OFFICE NOTE LYLES PA-C, C  HIM Release of Information Output  10/21/18 Requested records  Documents for the Encounter  AOB (Assignment of Insurance Benefits) Received Dec 24, 2018 2020  E-signature AOB     MEDICARE RIGHTS Received 2018/12/24 2020  E-signature Medicare Rights     ED Patient Billing Extract   ED PB Billing [place] Sheet Received December 24, 2018   EKG Received 12/22/18   Admission Information  Current Information  Attending Provider Admitting Provider Admission Type Admission Status  Roxan Hockey, MD Kathie Dike, MD Emergency Admission (Confirmed)       Admission Date/Time Discharge Date Hospital Service Auth/Cert Status  51/76/16 12:13 PM  Internal Medicine [facility] Room/Bed   [facility] AP-DEPT 300 W737/T062-69        Admission  Complaint  shortness of breath  Hospital Account  Name Acct ID Class Status Primary Coverage  [name] Rice, [name] Rice [id number] Inpatient Open MEDICARE - MEDICARE PART A AND B      Guarantor Account (for Hospital Account [id number])  Name Relation to Pt Service Area Active? Acct Type  [name] Rice Self [facility] Yes Personal/Family  Address Phone     [address] [place], [place] [address] [phone])        Coverage Information (for Hospital Account [id number])  F/O Payor/Plan Precert #  MEDICARE/MEDICARE PART A AND B   Subscriber Subscriber #  [name] Rice, [name] Rice [id number]  Address Phone  [address]  [place], [place] [address]

## 2019-01-18 DEATH — deceased

## 2019-02-02 ENCOUNTER — Telehealth: Payer: Medicare Other | Admitting: Cardiology
